# Patient Record
Sex: Male | Born: 1955 | Race: White | Hispanic: No | Marital: Married | Smoking: Current every day smoker
Health system: Southern US, Community
[De-identification: ages and names within clinical notes are randomized; demographics above are authoritative.]

## PROBLEM LIST (undated history)

## (undated) DIAGNOSIS — E785 Hyperlipidemia, unspecified: Secondary | ICD-10-CM

## (undated) DIAGNOSIS — M1990 Unspecified osteoarthritis, unspecified site: Secondary | ICD-10-CM

## (undated) HISTORY — PX: INNER EAR SURGERY: SHX679

## (undated) HISTORY — PX: APPENDECTOMY: SHX54

## (undated) HISTORY — PX: SHOULDER SURGERY: SHX246

## (undated) HISTORY — DX: Hyperlipidemia, unspecified: E78.5

## (undated) HISTORY — PX: TONSILLECTOMY: SUR1361

## (undated) HISTORY — PX: KNEE SURGERY: SHX244

---

## 1999-07-24 ENCOUNTER — Ambulatory Visit (HOSPITAL_COMMUNITY): Admission: RE | Admit: 1999-07-24 | Discharge: 1999-07-24 | Payer: Self-pay | Admitting: Orthopedic Surgery

## 1999-07-24 ENCOUNTER — Encounter: Payer: Self-pay | Admitting: Orthopedic Surgery

## 2004-05-29 ENCOUNTER — Ambulatory Visit (HOSPITAL_COMMUNITY): Admission: RE | Admit: 2004-05-29 | Discharge: 2004-05-29 | Payer: Self-pay | Admitting: Family Medicine

## 2005-02-19 ENCOUNTER — Ambulatory Visit: Payer: Self-pay | Admitting: Orthopedic Surgery

## 2006-06-22 ENCOUNTER — Emergency Department (HOSPITAL_COMMUNITY): Admission: EM | Admit: 2006-06-22 | Discharge: 2006-06-22 | Payer: Self-pay | Admitting: Emergency Medicine

## 2006-06-24 ENCOUNTER — Ambulatory Visit (HOSPITAL_COMMUNITY): Admission: RE | Admit: 2006-06-24 | Discharge: 2006-06-24 | Payer: Self-pay | Admitting: Family Medicine

## 2006-06-30 ENCOUNTER — Ambulatory Visit (HOSPITAL_COMMUNITY): Admission: RE | Admit: 2006-06-30 | Discharge: 2006-06-30 | Payer: Self-pay | Admitting: Family Medicine

## 2006-07-29 ENCOUNTER — Ambulatory Visit (HOSPITAL_COMMUNITY): Admission: RE | Admit: 2006-07-29 | Discharge: 2006-07-29 | Payer: Self-pay | Admitting: Neurosurgery

## 2007-12-14 ENCOUNTER — Ambulatory Visit (HOSPITAL_COMMUNITY): Admission: RE | Admit: 2007-12-14 | Discharge: 2007-12-14 | Payer: Self-pay | Admitting: Family Medicine

## 2008-11-03 HISTORY — PX: COLONOSCOPY: SHX174

## 2008-11-09 ENCOUNTER — Encounter: Payer: Self-pay | Admitting: Internal Medicine

## 2008-11-27 ENCOUNTER — Ambulatory Visit: Payer: Self-pay | Admitting: Internal Medicine

## 2008-11-27 ENCOUNTER — Ambulatory Visit (HOSPITAL_COMMUNITY): Admission: RE | Admit: 2008-11-27 | Discharge: 2008-11-27 | Payer: Self-pay | Admitting: Internal Medicine

## 2012-01-30 ENCOUNTER — Ambulatory Visit (HOSPITAL_COMMUNITY)
Admission: RE | Admit: 2012-01-30 | Discharge: 2012-01-30 | Disposition: A | Discharge status: Home / Self Care | Payer: BC Managed Care – PPO | Source: Ambulatory Visit | Attending: Cardiovascular Disease | Admitting: Cardiovascular Disease

## 2012-01-30 DIAGNOSIS — R079 Chest pain, unspecified: Secondary | ICD-10-CM | POA: Insufficient documentation

## 2012-11-02 ENCOUNTER — Other Ambulatory Visit: Payer: Self-pay | Admitting: Otolaryngology

## 2012-11-02 DIAGNOSIS — H905 Unspecified sensorineural hearing loss: Secondary | ICD-10-CM

## 2012-11-02 DIAGNOSIS — H9319 Tinnitus, unspecified ear: Secondary | ICD-10-CM

## 2012-11-02 DIAGNOSIS — H902 Conductive hearing loss, unspecified: Secondary | ICD-10-CM

## 2012-11-02 DIAGNOSIS — H6980 Other specified disorders of Eustachian tube, unspecified ear: Secondary | ICD-10-CM

## 2012-11-12 ENCOUNTER — Other Ambulatory Visit: Payer: BC Managed Care – PPO

## 2012-11-15 ENCOUNTER — Ambulatory Visit
Admission: RE | Admit: 2012-11-15 | Discharge: 2012-11-15 | Disposition: A | Discharge status: Home / Self Care | Payer: BC Managed Care – PPO | Source: Ambulatory Visit | Attending: Otolaryngology | Admitting: Otolaryngology

## 2012-11-15 ENCOUNTER — Other Ambulatory Visit: Payer: BC Managed Care – PPO

## 2012-11-15 ENCOUNTER — Other Ambulatory Visit: Payer: Self-pay | Admitting: Otolaryngology

## 2012-11-15 DIAGNOSIS — H905 Unspecified sensorineural hearing loss: Secondary | ICD-10-CM

## 2012-11-15 DIAGNOSIS — H6980 Other specified disorders of Eustachian tube, unspecified ear: Secondary | ICD-10-CM

## 2012-11-15 DIAGNOSIS — H902 Conductive hearing loss, unspecified: Secondary | ICD-10-CM

## 2012-11-15 DIAGNOSIS — H9319 Tinnitus, unspecified ear: Secondary | ICD-10-CM

## 2012-11-15 DIAGNOSIS — H65499 Other chronic nonsuppurative otitis media, unspecified ear: Secondary | ICD-10-CM

## 2013-06-17 ENCOUNTER — Emergency Department (HOSPITAL_COMMUNITY): Payer: BC Managed Care – PPO

## 2013-06-17 ENCOUNTER — Encounter (HOSPITAL_COMMUNITY): Payer: Self-pay | Admitting: Emergency Medicine

## 2013-06-17 ENCOUNTER — Emergency Department (HOSPITAL_COMMUNITY)
Admission: EM | Admit: 2013-06-17 | Discharge: 2013-06-17 | Disposition: A | Discharge status: Home / Self Care | Payer: BC Managed Care – PPO | Attending: Emergency Medicine | Admitting: Emergency Medicine

## 2013-06-17 DIAGNOSIS — Z9089 Acquired absence of other organs: Secondary | ICD-10-CM | POA: Insufficient documentation

## 2013-06-17 DIAGNOSIS — F17200 Nicotine dependence, unspecified, uncomplicated: Secondary | ICD-10-CM | POA: Insufficient documentation

## 2013-06-17 DIAGNOSIS — K5289 Other specified noninfective gastroenteritis and colitis: Secondary | ICD-10-CM | POA: Insufficient documentation

## 2013-06-17 DIAGNOSIS — K529 Noninfective gastroenteritis and colitis, unspecified: Secondary | ICD-10-CM

## 2013-06-17 LAB — COMPREHENSIVE METABOLIC PANEL
ALT: 27 U/L (ref 0–53)
AST: 24 U/L (ref 0–37)
Albumin: 3.6 g/dL (ref 3.5–5.2)
Alkaline Phosphatase: 126 U/L — ABNORMAL HIGH (ref 39–117)
BUN: 16 mg/dL (ref 6–23)
CO2: 28 mEq/L (ref 19–32)
Calcium: 9.5 mg/dL (ref 8.4–10.5)
Chloride: 101 mEq/L (ref 96–112)
Creatinine, Ser: 0.69 mg/dL (ref 0.50–1.35)
GFR calc Af Amer: >90 mL/min (ref >90)
GFR calc non Af Amer: >90 mL/min (ref >90)
Glucose, Bld: 91 mg/dL (ref 70–99)
Potassium: 4.4 mEq/L (ref 3.7–5.3)
Sodium: 140 mEq/L (ref 137–147)
Total Bilirubin: 0.3 mg/dL (ref 0.3–1.2)
Total Protein: 7 g/dL (ref 6.0–8.3)

## 2013-06-17 LAB — URINALYSIS, ROUTINE W REFLEX MICROSCOPIC
Bilirubin Urine: NEGATIVE
Glucose, UA: NEGATIVE mg/dL
Hgb urine dipstick: NEGATIVE
Ketones, ur: NEGATIVE mg/dL
Leukocytes, UA: NEGATIVE
Nitrite: NEGATIVE
Protein, ur: NEGATIVE mg/dL
Specific Gravity, Urine: >1.03 — ABNORMAL HIGH (ref 1.005–1.030)
Urobilinogen, UA: 0.2 mg/dL (ref 0.0–1.0)
pH: 5.5 (ref 5.0–8.0)

## 2013-06-17 LAB — LACTIC ACID, PLASMA: Lactic Acid, Venous: 0.9 mmol/L (ref 0.5–2.2)

## 2013-06-17 LAB — CBC WITH DIFFERENTIAL/PLATELET
Basophils Absolute: 0 10*3/uL (ref 0.0–0.1)
Basophils Relative: 0 % (ref 0–1)
Eosinophils Absolute: 0.3 10*3/uL (ref 0.0–0.7)
Eosinophils Relative: 4 % (ref 0–5)
HCT: 44.1 % (ref 39.0–52.0)
Hemoglobin: 14.8 g/dL (ref 13.0–17.0)
Lymphocytes Relative: 25 % (ref 12–46)
Lymphs Abs: 2 10*3/uL (ref 0.7–4.0)
MCH: 29.7 pg (ref 26.0–34.0)
MCHC: 33.6 g/dL (ref 30.0–36.0)
MCV: 88.6 fL (ref 78.0–100.0)
Monocytes Absolute: 0.7 10*3/uL (ref 0.1–1.0)
Monocytes Relative: 8 % (ref 3–12)
Neutro Abs: 5.1 10*3/uL (ref 1.7–7.7)
Neutrophils Relative %: 63 % (ref 43–77)
Platelets: 251 10*3/uL (ref 150–400)
RBC: 4.98 MIL/uL (ref 4.22–5.81)
RDW: 14 % (ref 11.5–15.5)
WBC: 8.1 10*3/uL (ref 4.0–10.5)

## 2013-06-17 LAB — LIPASE, BLOOD: Lipase: 20 U/L (ref 11–59)

## 2013-06-17 MED ORDER — GLYCOPYRROLATE 0.2 MG/ML IJ SOLN
0.1000 mg | Freq: Once | INTRAMUSCULAR | Status: AC
  Administered 2013-06-17: 0.1 mg via INTRAVENOUS
  Filled 2013-06-17: qty 1

## 2013-06-17 MED ORDER — IOHEXOL 300 MG/ML  SOLN
50.0000 mL | Freq: Once | INTRAMUSCULAR | Status: AC | PRN
  Administered 2013-06-17: 50 mL via ORAL

## 2013-06-17 MED ORDER — DOCUSATE SODIUM 100 MG PO CAPS: 100.0000 mg | ORAL_CAPSULE | Freq: Two times a day (BID) | ORAL | Status: DC

## 2013-06-17 MED ORDER — ONDANSETRON HCL 4 MG/2ML IJ SOLN
4.0000 mg | Freq: Once | INTRAMUSCULAR | Status: AC
  Administered 2013-06-17: 4 mg via INTRAVENOUS
  Filled 2013-06-17: qty 2

## 2013-06-17 MED ORDER — METRONIDAZOLE 500 MG PO TABS: 500.0000 mg | ORAL_TABLET | Freq: Two times a day (BID) | ORAL | Status: DC

## 2013-06-17 MED ORDER — METRONIDAZOLE 500 MG PO TABS
500.0000 mg | ORAL_TABLET | Freq: Once | ORAL | Status: AC
  Administered 2013-06-17: 500 mg via ORAL
  Filled 2013-06-17: qty 1

## 2013-06-17 MED ORDER — IOHEXOL 300 MG/ML  SOLN
100.0000 mL | Freq: Once | INTRAMUSCULAR | Status: AC | PRN
  Administered 2013-06-17: 100 mL via INTRAVENOUS

## 2013-06-17 MED ORDER — HYDROCODONE-ACETAMINOPHEN 5-325 MG PO TABS: 2.0000 | ORAL_TABLET | ORAL | Status: DC | PRN

## 2013-06-17 MED ORDER — HYDROMORPHONE HCL PF 1 MG/ML IJ SOLN
0.5000 mg | Freq: Once | INTRAMUSCULAR | Status: AC
  Administered 2013-06-17: 0.5 mg via INTRAVENOUS
  Filled 2013-06-17: qty 1

## 2013-06-17 MED ORDER — HYDROMORPHONE HCL PF 1 MG/ML IJ SOLN
1.0000 mg | INTRAMUSCULAR | Status: DC | PRN
  Administered 2013-06-17: 1 mg via INTRAVENOUS
  Filled 2013-06-17: qty 1

## 2013-06-17 MED ORDER — CIPROFLOXACIN HCL 500 MG PO TABS: 500.0000 mg | ORAL_TABLET | Freq: Two times a day (BID) | ORAL | Status: DC

## 2013-06-17 MED ORDER — SODIUM CHLORIDE 0.9 % IV BOLUS (SEPSIS)
1000.0000 mL | Freq: Once | INTRAVENOUS | Status: AC
  Administered 2013-06-17: 1000 mL via INTRAVENOUS

## 2013-06-17 MED ORDER — CIPROFLOXACIN HCL 250 MG PO TABS
500.0000 mg | ORAL_TABLET | Freq: Once | ORAL | Status: AC
  Administered 2013-06-17: 500 mg via ORAL
  Filled 2013-06-17: qty 2

## 2013-06-17 MED ORDER — DICYCLOMINE HCL 10 MG/ML IM SOLN
20.0000 mg | Freq: Once | INTRAMUSCULAR | Status: AC
  Administered 2013-06-17: 20 mg via INTRAMUSCULAR
  Filled 2013-06-17: qty 2

## 2013-06-17 NOTE — Care Management Note (Signed)
Patient was noted to not have a PCP listed, but per patient PCP is Dr Phillips [facility]orGolding, at [facility]  . Entered this information into computer.

## 2013-06-17 NOTE — ED Provider Notes (Signed)
CSN: [id number]     Arrival date & time 06/17/13  4696 History  This chart was scribed for Rolland Porter, MD by Jarvis Morgan, ED Scribe. This patient was seen in room APA14/APA14 and the patient's care was started at 8:34 AM.   Chief Complaint  Patient presents with  . Abdominal Pain     The history is provided by the patient. No language interpreter was used.   HPI Comments: Dale Perez is a 58 y.o. male who presents to the Emergency Department complaining of sharp, waxing and waning, gradually worsening RUQ abdominal pain for 3 days. Patient is having associated episodic diarrhea. Patient states that everytime he eats that he immediately has diarrhea. Patient states that the pain came on slow and then eventually became worse. Patient states that he has had this similar pain when he had his appendix out. Patient states that he had his appendix out about 1.5 years ago at [facility]. Patient denies any history of kidney infection, kidney stones, or gallbladder issues. Patient had a colonoscopy but is not sure if there were any abnormalities. He states that he believes his last colonoscopy was 4 years ago. Patient denies any SOB, hematuria, emesis, fever, chest pain, or swelling in his extremities.     History reviewed. No pertinent past medical history. Past Surgical History  Procedure Laterality Date  . Shoulder surgery    . Appendectomy    . Knee surgery    . Tonsillectomy     No family history on file. History  Substance Use Topics  . Smoking status: Current Every Day Smoker    Types: Cigarettes  . Smokeless tobacco: Not on file  . Alcohol Use: No    Review of Systems  Constitutional: Negative for fever, chills, diaphoresis, appetite change and fatigue.  HENT: Negative for mouth sores, sore throat and trouble swallowing.   Eyes: Negative for visual disturbance.  Respiratory: Negative for cough, chest tightness, shortness of breath and wheezing.   Cardiovascular:  Negative for chest pain and leg swelling.  Gastrointestinal: Positive for abdominal pain (RUQ) and diarrhea. Negative for nausea, vomiting and abdominal distention.  Endocrine: Negative for polydipsia, polyphagia and polyuria.  Genitourinary: Negative for dysuria, frequency and hematuria.  Musculoskeletal: Negative for gait problem.  Skin: Negative for color change, pallor and rash.  Neurological: Negative for dizziness, syncope, light-headedness and headaches.  Hematological: Does not bruise/bleed easily.  Psychiatric/Behavioral: Negative for behavioral problems and confusion.      Allergies  Review of patient's allergies indicates no known allergies.  Home Medications   Prior to Admission medications   Not on File   Triage Vitals: BP 128/86  Pulse 77  Temp(Src) 98.1 F (36.7 C) (Oral)  Resp 16  Ht 6\' 1"  (1.854 m)  Wt 153 lb (69.4 kg)  BMI 20.19 kg/m2  SpO2 96%  Physical Exam  Constitutional: He is oriented to person, place, and time. He appears well-developed and well-nourished. No distress.  HENT:  Head: Normocephalic.  Eyes: Conjunctivae are normal. Pupils are equal, round, and reactive to light. No scleral icterus.  Neck: Normal range of motion. Neck supple. No thyromegaly present.  Cardiovascular: Normal rate and regular rhythm.  Exam reveals no gallop and no friction rub.   No murmur heard. Pulmonary/Chest: Effort normal and breath sounds normal. No respiratory distress. He has no wheezes. He has no rales.  Abdominal: Soft. Bowel sounds are normal. He exhibits no distension. There is tenderness. There is no rebound.  N inguinal hernia.  Tender RUQ with rebound  Musculoskeletal: Normal range of motion.  Neurological: He is alert and oriented to person, place, and time.  Skin: Skin is warm and dry. No rash noted.  Psychiatric: He has a normal mood and affect. His behavior is normal.    ED Course  Procedures (including critical care time)  DIAGNOSTIC  STUDIES: Oxygen Saturation is 96% on RA, adequate by my interpretation.    COORDINATION OF CARE: 8:40 AM- Will order CT of Abdomen/Pelvis w/ contrast, CBC with diff, CMP, blood lipase, and UA. Will also order Zofran injection, Robinul injection, normal saline IV. Pt advised of plan for treatment and pt agrees.    Labs Review Labs Reviewed  COMPREHENSIVE METABOLIC PANEL - Abnormal; Notable for the following:    Alkaline Phosphatase 126 (*)    All other components within normal limits  URINALYSIS, ROUTINE W REFLEX MICROSCOPIC - Abnormal; Notable for the following:    Specific Gravity, Urine >1.030 (*)    All other components within normal limits  CBC WITH DIFFERENTIAL  LIPASE, BLOOD  LACTIC ACID, PLASMA    Imaging Review Ct Abdomen Pelvis W Contrast  06/17/2013   CLINICAL DATA:  RIGHT lower quadrant suprapubic pain for several days, past history appendectomy  EXAM: CT ABDOMEN AND PELVIS WITH CONTRAST  TECHNIQUE: Multidetector CT imaging of the abdomen and pelvis was performed using the standard protocol following bolus administration of intravenous contrast. Sagittal and coronal MPR images reconstructed from axial data set.  CONTRAST:  100mL OMNIPAQUE IOHEXOL 300 MG/ML SOLN IV. Dilute oral contrast.  FINDINGS: Minimal atelectasis at lung bases.  RIGHT renal cyst 3.6 x 3.3 cm.  Small hepatic cysts largest at lateral segment LEFT lobe 10 mm diameter image 14.  Liver, spleen, pancreas, kidneys, and adrenal glands otherwise normal appearance.  Scattered atherosclerotic calcifications with fusiform aneurysmal dilatation of distal abdominal aorta 3.1 x 3.0 cm image 44.  Marked wall thickening ascending colon and with pericolic inflammatory changes consistent with ascending colitis.  Additional mild wall thickening suspected at the hepatic flexure through transverse colon.  Descending colon, sigmoid colon and rectum normal appearance.  Stomach and small bowel loops normal appearance.  Numerous normal  sized small reactive lymph nodes and mesentery medial to RIGHT colon.  Appendix surgically absent.  Unremarkable bladder and ureters.  No mass, adenopathy, free fluid or free air.  Degenerative disc and facet disease changes of lumbosacral junction.  IMPRESSION: Ascending colitis with question additional wall thickening of the hepatic flexure through transverse colon.  Differential diagnosis includes infection, inflammatory bowel disease, and ischemia.  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  Hepatic and RIGHT renal cysts.   Electronically Signed   By: Ulyses SouthwardMark  Boles M.D.   On: 06/17/2013 10:34     EKG Interpretation None      MDM   Final diagnoses:  Colitis   CT scan shows thickening of the descending colon consistent with colitis. Patient is "unsure" he has had blood in his stools. He had a normal colonoscopy 4 years ago. No history of known colitis. Does not have difficult control pain I would suspect this is ischemic. He is well-hydrated. He has not been losing fluids to the point I think he is dehydrated. I do not think this is ischemic colitis. This is very likely infectious colitis. Plan to give additional hydration symptom control here. Doses of by mouth Cipro and Flagyl. Antispasmodics, pain medicine, and antibiotic prescriptions. Rest  and stay hydrated. Avoid dairy. GI followup.   I personally performed the services described in this documentation, which was scribed in my presence. The recorded information has been reviewed and is accurate.     Rolland PorterMark Emmer Lillibridge, MD 06/17/13 1328

## 2013-06-17 NOTE — Discharge Instructions (Signed)
Your CT scan shows inflammation or infection of your colon, colitis. This is caused by bacteria.  Be treated with antibiotics, pain medicines. You'll need to be reevaluated within the next 2 weeks and have a followup colonoscopy to ensure that this is resolved. She develop worsening pain, fever, vomiting, or inability to tolerate symptoms at home, then recheck here place  Colitis Colitis is inflammation of the colon. Colitis can be a short-term or long-standing (chronic) illness. Crohn's disease and ulcerative colitis are 2 types of colitis which are chronic. They usually require lifelong treatment. CAUSES  There are many different causes of colitis, including:  Viruses.  Germs (bacteria).  Medicine reactions. SYMPTOMS   Diarrhea.  Intestinal bleeding.  Pain.  Fever.  Throwing up (vomiting).  Tiredness (fatigue).  Weight loss.  Bowel blockage. DIAGNOSIS  The diagnosis of colitis is based on examination and stool or blood tests. X-rays, CT scan, and colonoscopy may also be needed. TREATMENT  Treatment may include:  Fluids given through the vein (intravenously).  Bowel rest (nothing to eat or drink for a period of time).  Medicine for pain and diarrhea.  Medicines (antibiotics) that kill germs.  Cortisone medicines.  Surgery. HOME CARE INSTRUCTIONS   Get plenty of rest.  Drink enough water and fluids to keep your urine clear or pale yellow.  Eat a well-balanced diet.  Call your caregiver for follow-up as recommended. SEEK IMMEDIATE MEDICAL CARE IF:   You develop chills.  You have an oral temperature above 102 F (38.9 C), not controlled by medicine.  You have extreme weakness, fainting, or dehydration.  You have repeated vomiting.  You develop severe belly (abdominal) pain or are passing bloody or tarry stools. MAKE SURE YOU:   Understand these instructions.  Will watch your condition.  Will get help right away if you are not doing well or get  worse. Document Released: 02/28/2004 Document Revised: 04/14/2011 Document Reviewed: 05/25/2009 [facility] Patient Information 2014 [employer] [place].

## 2013-06-17 NOTE — ED Notes (Signed)
RLQ pain x 3 days with diarrhea.  Denies n/v/fever.

## 2015-07-24 DIAGNOSIS — N200 Calculus of kidney: Secondary | ICD-10-CM | POA: Diagnosis not present

## 2015-07-24 DIAGNOSIS — M5136 Other intervertebral disc degeneration, lumbar region: Secondary | ICD-10-CM | POA: Diagnosis not present

## 2015-07-24 DIAGNOSIS — S39012A Strain of muscle, fascia and tendon of lower back, initial encounter: Secondary | ICD-10-CM | POA: Diagnosis not present

## 2015-07-24 DIAGNOSIS — I714 Abdominal aortic aneurysm, without rupture: Secondary | ICD-10-CM | POA: Diagnosis not present

## 2015-07-24 DIAGNOSIS — F17200 Nicotine dependence, unspecified, uncomplicated: Secondary | ICD-10-CM | POA: Diagnosis not present

## 2015-07-24 DIAGNOSIS — M5137 Other intervertebral disc degeneration, lumbosacral region: Secondary | ICD-10-CM | POA: Diagnosis not present

## 2015-08-09 ENCOUNTER — Emergency Department (HOSPITAL_COMMUNITY): Payer: Worker's Compensation

## 2015-08-09 ENCOUNTER — Emergency Department (HOSPITAL_COMMUNITY)
Admission: EM | Admit: 2015-08-09 | Discharge: 2015-08-10 | Disposition: A | Discharge status: Home / Self Care | Payer: Worker's Compensation | Attending: Emergency Medicine | Admitting: Emergency Medicine

## 2015-08-09 ENCOUNTER — Encounter: Payer: Self-pay | Admitting: *Deleted

## 2015-08-09 ENCOUNTER — Encounter (HOSPITAL_COMMUNITY): Payer: Self-pay

## 2015-08-09 ENCOUNTER — Emergency Department (INDEPENDENT_AMBULATORY_CARE_PROVIDER_SITE_OTHER)
Admission: EM | Admit: 2015-08-09 | Discharge: 2015-08-09 | Disposition: A | Discharge status: Other Acute Inpatient Hospital | Payer: Worker's Compensation | Source: Home / Self Care | Attending: Emergency Medicine | Admitting: Emergency Medicine

## 2015-08-09 DIAGNOSIS — S66222A Laceration of extensor muscle, fascia and tendon of left thumb at wrist and hand level, initial encounter: Secondary | ICD-10-CM | POA: Insufficient documentation

## 2015-08-09 DIAGNOSIS — Z23 Encounter for immunization: Secondary | ICD-10-CM | POA: Diagnosis not present

## 2015-08-09 DIAGNOSIS — Z886 Allergy status to analgesic agent status: Secondary | ICD-10-CM | POA: Diagnosis not present

## 2015-08-09 DIAGNOSIS — S66529A Laceration of intrinsic muscle, fascia and tendon of unspecified finger at wrist and hand level, initial encounter: Secondary | ICD-10-CM | POA: Diagnosis not present

## 2015-08-09 DIAGNOSIS — S61012A Laceration without foreign body of left thumb without damage to nail, initial encounter: Secondary | ICD-10-CM

## 2015-08-09 DIAGNOSIS — S61219A Laceration without foreign body of unspecified finger without damage to nail, initial encounter: Secondary | ICD-10-CM

## 2015-08-09 DIAGNOSIS — S61209A Unspecified open wound of unspecified finger without damage to nail, initial encounter: Secondary | ICD-10-CM | POA: Diagnosis present

## 2015-08-09 DIAGNOSIS — F17210 Nicotine dependence, cigarettes, uncomplicated: Secondary | ICD-10-CM | POA: Insufficient documentation

## 2015-08-09 DIAGNOSIS — S61002A Unspecified open wound of left thumb without damage to nail, initial encounter: Secondary | ICD-10-CM | POA: Diagnosis not present

## 2015-08-09 LAB — CBC WITH DIFFERENTIAL/PLATELET
Basophils Absolute: 0 10*3/uL (ref 0.0–0.1)
Basophils Relative: 0 %
Eosinophils Absolute: 0.3 10*3/uL (ref 0.0–0.7)
Eosinophils Relative: 4 %
HCT: 37 % — ABNORMAL LOW (ref 39.0–52.0)
Hemoglobin: 13.5 g/dL (ref 13.0–17.0)
Lymphocytes Relative: 40 %
Lymphs Abs: 2.7 10*3/uL (ref 0.7–4.0)
MCH: 32.7 pg (ref 26.0–34.0)
MCHC: 36.5 g/dL — ABNORMAL HIGH (ref 30.0–36.0)
MCV: 89.6 fL (ref 78.0–100.0)
Monocytes Absolute: 0.5 10*3/uL (ref 0.1–1.0)
Monocytes Relative: 7 %
Neutro Abs: 3.4 10*3/uL (ref 1.7–7.7)
Neutrophils Relative %: 49 %
Platelets: 170 10*3/uL (ref 150–400)
RBC: 4.13 MIL/uL — ABNORMAL LOW (ref 4.22–5.81)
RDW: 15 % (ref 11.5–15.5)
WBC: 6.8 10*3/uL (ref 4.0–10.5)

## 2015-08-09 LAB — BASIC METABOLIC PANEL
Anion gap: 6 (ref 5–15)
BUN: 12 mg/dL (ref 6–20)
CO2: 26 mmol/L (ref 22–32)
Calcium: 8.7 mg/dL — ABNORMAL LOW (ref 8.9–10.3)
Chloride: 105 mmol/L (ref 101–111)
Creatinine, Ser: 0.64 mg/dL (ref 0.61–1.24)
GFR calc Af Amer: >60 mL/min (ref >60)
GFR calc non Af Amer: >60 mL/min (ref >60)
Glucose, Bld: 92 mg/dL (ref 65–99)
Potassium: 4 mmol/L (ref 3.5–5.1)
Sodium: 137 mmol/L (ref 135–145)

## 2015-08-09 MED ORDER — SUFENTANIL CITRATE 50 MCG/ML IV SOLN
INTRAVENOUS | Status: AC
  Filled 2015-08-09: qty 1

## 2015-08-09 MED ORDER — TETANUS-DIPHTH-ACELL PERTUSSIS 5-2.5-18.5 LF-MCG/0.5 IM SUSP
0.5000 mL | Freq: Once | INTRAMUSCULAR | Status: AC
  Administered 2015-08-09: 0.5 mL via INTRAMUSCULAR

## 2015-08-09 MED ORDER — OXYCODONE-ACETAMINOPHEN 5-325 MG PO TABS
1.0000 | ORAL_TABLET | Freq: Once | ORAL | Status: AC
  Administered 2015-08-09: 1 via ORAL
  Filled 2015-08-09: qty 1

## 2015-08-09 MED ORDER — PROPOFOL 10 MG/ML IV BOLUS
INTRAVENOUS | Status: AC
  Filled 2015-08-09: qty 20

## 2015-08-09 MED ORDER — MIDAZOLAM HCL 2 MG/2ML IJ SOLN
INTRAMUSCULAR | Status: AC
  Filled 2015-08-09: qty 2

## 2015-08-09 NOTE — ED Notes (Addendum)
Pt works for [employer]. 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 Site cleaned with Hibiclens, he is in need of a tetanus vaccine. Dannette BarbaraNana Hendricks spoke with supervisor, Per Rennie Natteravid Larkin, do not perform drug screen.

## 2015-08-09 NOTE — ED Notes (Signed)
Patient transported to X-ray 

## 2015-08-09 NOTE — ED Notes (Signed)
Per Pt, Pt is coming from UC where he was seen for a hand injury that took place at work. Pt reports being Worker's Comp. Pt was sent to ED due to inability to fully move thumb and potential for tendon repair. Bleeding has been controlled and UC splinted the left thumb.

## 2015-08-09 NOTE — Anesthesia Preprocedure Evaluation (Addendum)
Anesthesia Evaluation  Patient identified by MRN, date of birth, ID band Patient awake    Reviewed: Allergy & Precautions, H&P , NPO status , Patient's Chart, lab work & pertinent test results  Airway Mallampati: II  TM Distance: >3 FB Neck ROM: Full    Dental no notable dental hx. (+) Teeth Intact, Dental Advisory Given   Pulmonary Current Smoker,    Pulmonary exam normal breath sounds clear to auscultation       Cardiovascular negative cardio ROS   Rhythm:Regular Rate:Normal     Neuro/Psych negative neurological ROS  negative psych ROS   GI/Hepatic negative GI ROS, Neg liver ROS,   Endo/Other  negative endocrine ROS  Renal/GU negative Renal ROS  negative genitourinary   Musculoskeletal   Abdominal   Peds  Hematology negative hematology ROS (+)   Anesthesia Other Findings   Reproductive/Obstetrics negative OB ROS                            Anesthesia Physical Anesthesia Plan  ASA: II  Anesthesia Plan: General   Post-op Pain Management:    Induction: Intravenous  Airway Management Planned: LMA and Oral ETT  Additional Equipment:   Intra-op Plan:   Post-operative Plan: Extubation in OR  Informed Consent: I have reviewed the patients History and Physical, chart, labs and discussed the procedure including the risks, benefits and alternatives for the proposed anesthesia with the patient or authorized representative who has indicated his/her understanding and acceptance.   Dental advisory given  Plan Discussed with: CRNA  Anesthesia Plan Comments:         Anesthesia Quick Evaluation

## 2015-08-09 NOTE — ED Provider Notes (Signed)
CSN: [id number]     Arrival date & time 08/09/15  1423 History   First MD Initiated Contact with Patient 08/09/15 1441     Chief Complaint  Patient presents with  . Extremity Laceration   [facility]. Acute Worker's Comp. injury HPI While on the job today, accidentally cut dorsum left thumb. Has mild to moderate pain, but notes he cannot extend left thumb. Prior to the injury, he could extend the left thumb without problems. Last tetanus shot over 5 years ago No past medical history on file. Past Surgical History  Procedure Laterality Date  . Shoulder surgery    . Appendectomy    . Knee surgery    . Tonsillectomy     No family history on file. Social History  Substance Use Topics  . Smoking status: Current Every Day Smoker    Types: Cigarettes  . Smokeless tobacco: Not on file  . Alcohol Use: No    Review of Systems  All other systems reviewed and are negative.   Allergies  Review of patient's allergies indicates no known allergies.  Home Medications   Prior to Admission medications   Medication Sig Start Date End Date Taking? Authorizing Provider  ciprofloxacin (CIPRO) 500 MG tablet Take 1 tablet (500 mg total) by mouth every 12 (twelve) hours. 06/17/13   Rolland PorterMark James, MD  docusate sodium (COLACE) 100 MG capsule Take 1 capsule (100 mg total) by mouth every 12 (twelve) hours. 06/17/13   Rolland PorterMark James, MD  HYDROcodone-acetaminophen (NORCO/VICODIN) 5-325 MG per tablet Take 2 tablets by mouth every 4 (four) hours as needed. 06/17/13   Rolland PorterMark James, MD  methocarbamol (ROBAXIN) 500 MG tablet Take 1 tablet by mouth 3 (three) times daily as needed for muscle spasms.  06/02/13   Historical Provider, MD  metroNIDAZOLE (FLAGYL) 500 MG tablet Take 1 tablet (500 mg total) by mouth 2 (two) times daily. 06/17/13   Rolland PorterMark James, MD  Misc Natural Products (DAILY HERBS BONE/JOINTS PO) Take 1 tablet by mouth 2 (two) times daily.    Historical Provider, MD  naproxen sodium  (ANAPROX) 220 MG tablet Take 440 mg by mouth 2 (two) times daily with a meal.    Historical Provider, MD  oxyCODONE-acetaminophen (PERCOCET/ROXICET) 5-325 MG per tablet Take 1 tablet by mouth daily as needed for moderate pain or severe pain.  06/02/13   Historical Provider, MD   Meds Ordered and Administered this Visit   Medications  Tdap (BOOSTRIX) injection 0.5 mL (0.5 mLs Intramuscular Given 08/09/15 1518)    There were no vitals taken for this visit. No data found.   Physical Exam  Constitutional: He is oriented to person, place, and time. He appears well-developed and well-nourished. No distress.  HENT:  Head: Normocephalic and atraumatic.  Eyes: Conjunctivae and EOM are normal. Pupils are equal, round, and reactive to light. No scleral icterus.  Neck: Normal range of motion.  Cardiovascular: Normal rate.   Pulmonary/Chest: Effort normal.  Abdominal: He exhibits no distension.  Musculoskeletal: Normal range of motion.       Left hand: Decreased strength noted.       Hands: Neurological: He is alert and oriented to person, place, and time.  Skin: Skin is warm.  Psychiatric: He has a normal mood and affect.  Nursing note and vitals reviewed.   ED Course  Procedures (including critical care time)  Labs Review Labs Reviewed - No data to display  Imaging Review No results found.  MDM   1. Laceration of left thumb with tendon involvement, initial encounter    Likely has acute laceration through extensor tendon left thumb, inability to extend left thumb DIP. In my opinion, needs referral to specialist for repair  Plans: DTaP given today. Wound bandaged and splinted. [facility] care and repair.     Lajean Manesavid Massey, MD 08/09/15 [phone]

## 2015-08-09 NOTE — ED Provider Notes (Signed)
CSN: [id number]     Arrival date & time 08/09/15  1718 History   First MD Initiated Contact with Patient 08/09/15 1856     Chief Complaint  Patient presents with  . 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 Patient [place] went [place] where he was unable to extend his thumb. Decreased extension increased suspicion of extensor tendon involvement and the [facility]. Patient has pain in left thumb but denies additional pain or injury. He was not able to extend his thumb upon evaluation in the ED. Tdap was updated at urgent care. Patient is a smoker. Patient denies fever, numbness, recent illness, SOB, CP, N/V.   [name] presenting with hand injury. The history is provided by the patient. No language interpreter was used.  Hand Injury Associated symptoms: no fever     History reviewed. No pertinent past medical history. Past Surgical History  Procedure Laterality Date  . Shoulder surgery    . Appendectomy    . Knee surgery    . Tonsillectomy     No family history on file. Social History  Substance Use Topics  . Smoking status: Current Every Day Smoker    Types: Cigarettes  . Smokeless tobacco: None  . Alcohol Use: No    Review of Systems  Constitutional: Negative for fever.  Musculoskeletal: Positive for arthralgias.  Skin: Positive for wound. Negative for rash.  Neurological: Positive for weakness. Negative for numbness.      Allergies  Asa  Home Medications   Prior to Admission medications   Medication Sig Start Date End Date Taking? Authorizing Provider  naproxen sodium (ALEVE) 220 MG tablet Take 440 mg by mouth every morning.   Yes Historical Provider, MD   BP 147/82 mmHg  Pulse 52  Temp(Src) 98.5 F (36.9 C)  (Oral)  Resp 16  Ht 6\' 1"  (1.854 m)  Wt 74.844 kg  BMI 21.77 kg/m2  SpO2 93% Physical Exam  Constitutional: He appears well-developed and well-nourished. No distress.  HENT:  Head: Normocephalic and atraumatic.  Eyes: Right eye exhibits no discharge. Left eye exhibits no discharge.  Cardiovascular: Normal rate, regular rhythm and intact distal pulses.   Bilateral radial pulses are intact. Good capillary refill to his left distal thumb.  Pulmonary/Chest: Effort normal. No respiratory distress.  Musculoskeletal: He exhibits tenderness.  Patient has a 2 cm laceration over the dorsal aspect of his left thumb. See picture for more details. Patient has good strength with flexion of his left thumb but is unable to extend his left thumb under light resistance. Bleeding is controled.   Neurological: He is alert. Coordination normal.  Sensation is intact to his left distal fingertips.  Skin: Skin is warm and dry. No rash noted. He is not diaphoretic. No erythema. No pallor.  Psychiatric: He has a normal mood and affect. His behavior is normal.  Nursing note and vitals reviewed.       ED Course  Procedures (including critical care time) Labs Review Labs Reviewed  BASIC METABOLIC PANEL - Abnormal; Notable for the following:    Calcium 8.7 (*)    All other components within normal limits  CBC WITH DIFFERENTIAL/PLATELET - Abnormal; Notable for the following:    RBC  4.13 (*)    HCT 37.0 (*)    MCHC 36.5 (*)    All other components within normal limits    Imaging Review Dg Hand Complete Left  08/09/2015  CLINICAL DATA:  Trauma to the left thumb.  Struck by a broken duct. EXAM: LEFT HAND - COMPLETE 3+ VIEW COMPARISON:  None. FINDINGS: No evidence of fracture or dislocation. Tiny radiopaque foreign object at the tip of the thumb (less than 1 mm). Tiny radiopaque foreign object in the soft tissues of the index finger at the DIP joint level, less than 1 mm. 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 IMPRESSION: No likely acute finding. 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 Fleck like metallic density foreign objects at the tip of the thumb and in the distal index finger, likely old. Electronically Signed   By: Paulina FusiMark  Shogry M.D.   On: 08/09/2015 20:19   I have personally reviewed and evaluated these images and lab results as part of my medical decision-making.   EKG Interpretation None      Filed Vitals:   08/09/15 2320 08/09/15 2330 08/10/15 0000 08/10/15 0030  BP:  132/82 143/87 147/82  Pulse: 56 58 55 52  Temp:      TempSrc:      Resp:      Height:      Weight:      SpO2: 95% 93% 96% 93%     MDM   Meds given in ED:  Medications  oxyCODONE-acetaminophen (PERCOCET/ROXICET) 5-325 MG per tablet 1 tablet (1 tablet Oral Given 08/09/15 1956)    New Prescriptions   No medications on file    Final diagnoses:  Laceration of finger with tendon involvement, initial encounter   This is a 60 yo right hand dominant white male presents to ED after work related injury when installing an elevator part that resulted in laceration to dorsal surface of left thumb. Patient [place] went [place] urgent [place] where he was unable to extend his thumb. Decreased extension increased suspicion of extensor tendon involvement and the [facility]. Patient has pain in left thumb but denies additional pain or injury.  On exam patient is afebrile nontoxic appearing. He has a 2 cm laceration over the dorsal aspect of his left thumb. Patient is able to flex his thumb but is unable to extend his thumb. He is neurovascularly intact. X-ray shows no acute bony abnormality. The suspected foreign bodies are either old or unrelated to this injury. I consulted with hand surgeon Dr. Janee Mornhompson who plans to take the patient to the OR for tendon and laceration repair.  Patient is in agreement with plan. The OR for tendon laceration repair by Dr. Janee Mornhompson.   Everlene FarrierWilliam Jaeleigh Monaco, PA-C 08/10/15 0100  Richardean Canalavid H Yao, MD 08/10/15 1140

## 2015-08-10 ENCOUNTER — Emergency Department (HOSPITAL_COMMUNITY): Payer: Worker's Compensation | Admitting: Certified Registered"

## 2015-08-10 ENCOUNTER — Encounter (HOSPITAL_COMMUNITY)
Admission: EM | Disposition: A | Discharge status: Home / Self Care | Payer: Self-pay | Source: Home / Self Care | Attending: Emergency Medicine

## 2015-08-10 ENCOUNTER — Encounter (HOSPITAL_COMMUNITY): Payer: Self-pay | Admitting: Certified Registered"

## 2015-08-10 HISTORY — PX: TENDON REPAIR: SHX5111

## 2015-08-10 HISTORY — PX: I & D EXTREMITY: SHX5045

## 2015-08-10 SURGERY — TENDON REPAIR
Anesthesia: General | Site: Finger

## 2015-08-10 MED ORDER — LACTATED RINGERS IV SOLN
INTRAVENOUS | Status: DC | PRN
  Administered 2015-08-10 (×2): via INTRAVENOUS

## 2015-08-10 MED ORDER — GLYCOPYRROLATE 0.2 MG/ML IV SOSY
PREFILLED_SYRINGE | INTRAVENOUS | Status: AC
  Filled 2015-08-10: qty 3

## 2015-08-10 MED ORDER — EPHEDRINE SULFATE 50 MG/ML IJ SOLN
INTRAMUSCULAR | Status: DC | PRN
  Administered 2015-08-10: 10 mg via INTRAVENOUS

## 2015-08-10 MED ORDER — ONDANSETRON HCL 4 MG/2ML IJ SOLN
INTRAMUSCULAR | Status: DC | PRN
  Administered 2015-08-10: 4 mg via INTRAVENOUS

## 2015-08-10 MED ORDER — CEFAZOLIN SODIUM 1 G IJ SOLR
INTRAMUSCULAR | Status: DC | PRN
  Administered 2015-08-10: 2 g via INTRAMUSCULAR

## 2015-08-10 MED ORDER — DEXAMETHASONE SODIUM PHOSPHATE 10 MG/ML IJ SOLN
INTRAMUSCULAR | Status: DC | PRN
  Administered 2015-08-10: 10 mg via INTRAVENOUS

## 2015-08-10 MED ORDER — 0.9 % SODIUM CHLORIDE (POUR BTL) OPTIME
TOPICAL | Status: DC | PRN
  Administered 2015-08-10: 1000 mL

## 2015-08-10 MED ORDER — HYDROMORPHONE HCL 1 MG/ML IJ SOLN: 0.2500 mg | INTRAMUSCULAR | Status: DC | PRN

## 2015-08-10 MED ORDER — SUFENTANIL CITRATE 50 MCG/ML IV SOLN
INTRAVENOUS | Status: DC | PRN
  Administered 2015-08-10 (×2): 10 ug via INTRAVENOUS

## 2015-08-10 MED ORDER — OXYCODONE-ACETAMINOPHEN 5-325 MG PO TABS: 1.0000 | ORAL_TABLET | ORAL | Status: DC | PRN

## 2015-08-10 MED ORDER — GLYCOPYRROLATE 0.2 MG/ML IJ SOLN
INTRAMUSCULAR | Status: DC | PRN
  Administered 2015-08-10: 0.2 mg via INTRAVENOUS

## 2015-08-10 MED ORDER — LIDOCAINE HCL (CARDIAC) 20 MG/ML IV SOLN
INTRAVENOUS | Status: DC | PRN
  Administered 2015-08-10: 100 mg via INTRATRACHEAL

## 2015-08-10 MED ORDER — BUPIVACAINE-EPINEPHRINE 0.5% -1:200000 IJ SOLN
INTRAMUSCULAR | Status: DC | PRN
  Administered 2015-08-10: 9 mL

## 2015-08-10 MED ORDER — LIDOCAINE 2% (20 MG/ML) 5 ML SYRINGE
INTRAMUSCULAR | Status: AC
  Filled 2015-08-10: qty 5

## 2015-08-10 MED ORDER — DEXAMETHASONE SODIUM PHOSPHATE 10 MG/ML IJ SOLN
INTRAMUSCULAR | Status: AC
  Filled 2015-08-10: qty 1

## 2015-08-10 MED ORDER — MIDAZOLAM HCL 2 MG/2ML IJ SOLN
INTRAMUSCULAR | Status: DC | PRN
  Administered 2015-08-10: 2 mg via INTRAVENOUS

## 2015-08-10 MED ORDER — ONDANSETRON HCL 4 MG/2ML IJ SOLN
INTRAMUSCULAR | Status: AC
  Filled 2015-08-10: qty 2

## 2015-08-10 MED ORDER — CEFAZOLIN SODIUM 1 G IJ SOLR
INTRAMUSCULAR | Status: AC
  Filled 2015-08-10: qty 20

## 2015-08-10 MED ORDER — SUCCINYLCHOLINE CHLORIDE 20 MG/ML IJ SOLN
INTRAMUSCULAR | Status: DC | PRN
  Administered 2015-08-10: 100 mg via INTRAVENOUS

## 2015-08-10 MED ORDER — CEPHALEXIN 500 MG PO CAPS: 500.0000 mg | ORAL_CAPSULE | Freq: Four times a day (QID) | ORAL | Status: DC

## 2015-08-10 MED ORDER — BUPIVACAINE-EPINEPHRINE (PF) 0.5% -1:200000 IJ SOLN
INTRAMUSCULAR | Status: AC
  Filled 2015-08-10: qty 30

## 2015-08-10 MED ORDER — PROPOFOL 10 MG/ML IV BOLUS
INTRAVENOUS | Status: DC | PRN
  Administered 2015-08-10: 50 mg via INTRAVENOUS
  Administered 2015-08-10: 150 mg via INTRAVENOUS

## 2015-08-10 SURGICAL SUPPLY — 44 items
BANDAGE COBAN STERILE 2 (GAUZE/BANDAGES/DRESSINGS) IMPLANT
BLADE SURG 15 STRL LF DISP TIS (BLADE) ×2 IMPLANT
BLADE SURG 15 STRL SS (BLADE) ×3
BNDG CMPR 9X4 STRL LF SNTH (GAUZE/BANDAGES/DRESSINGS) ×1
BNDG COHESIVE 4X5 TAN STRL (GAUZE/BANDAGES/DRESSINGS) ×4 IMPLANT
BNDG ESMARK 4X9 LF (GAUZE/BANDAGES/DRESSINGS) ×4 IMPLANT
BNDG GAUZE ELAST 4 BULKY (GAUZE/BANDAGES/DRESSINGS) ×4 IMPLANT
BRUSH SCRUB EZ PLAIN DRY (MISCELLANEOUS) IMPLANT
CANISTER SUCTION 2500CC (MISCELLANEOUS) ×4 IMPLANT
CHLORAPREP W/TINT 26ML (MISCELLANEOUS) ×4 IMPLANT
CORDS BIPOLAR (ELECTRODE) ×4 IMPLANT
CUFF TOURNIQUET SINGLE 18IN (TOURNIQUET CUFF) ×4 IMPLANT
CUFF TOURNIQUET SINGLE 24IN (TOURNIQUET CUFF) IMPLANT
DRAPE SURG 17X23 STRL (DRAPES) ×4 IMPLANT
DRSG ADAPTIC 3X8 NADH LF (GAUZE/BANDAGES/DRESSINGS) ×4 IMPLANT
DRSG EMULSION OIL 3X3 NADH (GAUZE/BANDAGES/DRESSINGS) IMPLANT
GAUZE SPONGE 4X4 12PLY STRL (GAUZE/BANDAGES/DRESSINGS) ×4 IMPLANT
GAUZE XEROFORM 1X8 LF (GAUZE/BANDAGES/DRESSINGS) ×4 IMPLANT
GLOVE BIO SURGEON STRL SZ7.5 (GLOVE) ×4 IMPLANT
GLOVE BIOGEL PI IND STRL 7.5 (GLOVE) ×2 IMPLANT
GLOVE BIOGEL PI IND STRL 8 (GLOVE) ×2 IMPLANT
GLOVE BIOGEL PI INDICATOR 7.5 (GLOVE) ×2
GLOVE BIOGEL PI INDICATOR 8 (GLOVE) ×2
GLOVE SURG SS PI 7.5 STRL IVOR (GLOVE) ×8 IMPLANT
GOWN STRL REUS W/ TWL XL LVL3 (GOWN DISPOSABLE) ×2 IMPLANT
GOWN STRL REUS W/TWL XL LVL3 (GOWN DISPOSABLE) ×3
KIT BASIN OR (CUSTOM PROCEDURE TRAY) ×4 IMPLANT
NEEDLE HYPO 25X1 1.5 SAFETY (NEEDLE) ×4 IMPLANT
NS IRRIG 1000ML POUR BTL (IV SOLUTION) ×4 IMPLANT
PACK ORTHO EXTREMITY (CUSTOM PROCEDURE TRAY) ×4 IMPLANT
PAD CAST 4YDX4 CTTN HI CHSV (CAST SUPPLIES) ×2 IMPLANT
PADDING CAST ABS 4INX4YD NS (CAST SUPPLIES) ×2
PADDING CAST ABS COTTON 4X4 ST (CAST SUPPLIES) ×2 IMPLANT
PADDING CAST COTTON 4X4 STRL (CAST SUPPLIES) ×3
SPONGE GAUZE 4X4 12PLY STER LF (GAUZE/BANDAGES/DRESSINGS) ×4 IMPLANT
SUT ETHIBOND 3-0 V-5 (SUTURE) ×4 IMPLANT
SUT PROLENE 6 0 P 1 18 (SUTURE) ×4 IMPLANT
SUT VIC AB 2-0 CT3 27 (SUTURE) IMPLANT
SUT VICRYL 4-0 PS2 18IN ABS (SUTURE) IMPLANT
SUT VICRYL RAPIDE 4/0 PS 2 (SUTURE) ×8 IMPLANT
SYR CONTROL 10ML LL (SYRINGE) ×4 IMPLANT
SYRINGE 10CC LL (SYRINGE) IMPLANT
TOWEL OR 17X24 6PK STRL BLUE (TOWEL DISPOSABLE) ×4 IMPLANT
UNDERPAD 30X30 INCONTINENT (UNDERPADS AND DIAPERS) ×4 IMPLANT

## 2015-08-10 NOTE — Op Note (Signed)
08/09/2015 - 08/10/2015  2:16 AM  PATIENT:  Dale Perez  60 y.o. male  PRE-OPERATIVE DIAGNOSIS:  Left thumb extensor tendon laceration  POST-OPERATIVE DIAGNOSIS:  Same  PROCEDURE:   1.  Left thumb wound excisional debridement of skin    2.  Left thumb extensor tendon repair, zone 2    3.  Left thumb traumatic wound closure, simple, 1.5 cm  SURGEON: Cliffton Astersavid A. Janee Mornhompson, MD  PHYSICIAN ASSISTANT: None  ANESTHESIA:  general  SPECIMENS:  None  DRAINS:   None  EBL:  less than 50 mL  PREOPERATIVE INDICATIONS:  Dale Perez is a  60 y.o. male with left thumb traumatic extensor tendon laceration  The risks benefits and alternatives were discussed with the patient preoperatively including but not limited to the risks of infection, bleeding, nerve injury, cardiopulmonary complications, the need for revision surgery, among others, and the patient verbalized understanding and consented to proceed.  OPERATIVE IMPLANTS: None  OPERATIVE PROCEDURE:  After receiving prophylactic antibiotics, the patient was escorted to the operative theatre and placed in a supine position.  General anesthesia was administered.  A surgical "time-out" was performed during which the planned procedure, proposed operative site, and the correct patient identity were compared to the operative consent and agreement confirmed by the circulating nurse according to current facility policy.  Following application of a tourniquet to the operative extremity, the exposed skin was pre-scrub with Hibiclens scrub brush before being formally prepped with Chloraprep and draped in the usual sterile fashion.  The limb was exsanguinated with an Esmarch bandage and the tourniquet inflated to approximately 100mmHg higher than systolic BP.  The wound was copiously irrigated.  The mostly transverse laceration was extended distally on the ulnar side.  This allowed for better reflection of the distal flap and observation of the laceration.   The skin edges of the laceration themselves were excisionally debrided to better healthy skin.  The extensor tendon was completely transected, and it was repaired with 3-0 Ethibond suture, with a grasping suture technique, with 4 core strand passes.  Additionally, a running 6-0 Prolene Silverskold suture was placed on the dorsal surface.  There was no gapping of the tendon, even with passive flexion to some degree at the IP joint.  The tourniquet was released, after some Marcaine with epinephrine instilled in the skin edges for postoperative pain control and additional hemostasis.  The surgically created incision extension was closed with 4-0 Vicryl Rapide interrupted suture, as was the traumatic laceration itself.  A short arm splint dressing was applied with a thumb spica plaster component, placing the thumb into extension, and he was awakened and taken to the recovery room in stable condition, breathing spontaneously.  DISPOSITION: He'll be discharged home today, with typical postop instructions.  No work until at least his first postop visit.  Worker's Compensation authorization will need to be obtained for continued care.  When he returns in 7-10 days, he should have a follow-on appointment with hand therapy to make a custom fabricated splint and begin extensor tendon rehabilitation for the thumb in zone 2.

## 2015-08-10 NOTE — Transfer of Care (Signed)
Immediate Anesthesia Transfer of Care Note  Patient: Dale Perez  Procedure(s) Performed: Procedure(s): TENDON REPAIR WITH WOUND CLOSURE (N/A) IRRIGATION AND DEBRIDEMENT EXTREMITY (Left)  Patient Location: PACU  Anesthesia Type:General  Level of Consciousness: awake, oriented, patient cooperative and responds to stimulation  Airway & Oxygen Therapy: Patient Spontanous Breathing and Patient connected to nasal cannula oxygen  Post-op Assessment: Report given to RN, Post -op Vital signs reviewed and stable and Patient moving all extremities X 4  Post vital signs: Reviewed and stable  Last Vitals:  Filed Vitals:   08/10/15 0000 08/10/15 0030  BP: 143/87 147/82  Pulse: 55 52  Temp:    Resp:      Last Pain:  Filed Vitals:   08/10/15 0227  PainSc: 6          Complications: No apparent anesthesia complications

## 2015-08-10 NOTE — Anesthesia Procedure Notes (Signed)
Procedure Name: Intubation Date/Time: 08/10/2015 1:23 AM Performed by: Melina SchoolsBANKS, [place] Sorn J Pre-anesthesia Checklist: Patient identified, Emergency Drugs available, Suction available, Patient being monitored and Timeout performed Patient Re-evaluated:Patient Re-evaluated prior to inductionOxygen Delivery Method: Circle system utilized Preoxygenation: Pre-oxygenation with 100% oxygen Intubation Type: IV induction, Rapid sequence and Cricoid Pressure applied Laryngoscope Size: Mac and 4 Grade View: Grade II Tube type: Oral Tube size: 8.0 mm Number of attempts: 1 Airway Equipment and Method: Stylet Placement Confirmation: ETT inserted through vocal cords under direct vision,  positive ETCO2 and breath sounds checked- equal and bilateral Secured at: 23 cm Tube secured with: Tape Dental Injury: Teeth and Oropharynx as per pre-operative assessment
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
## 2015-08-10 NOTE — Discharge Instructions (Signed)
Discharge Instructions   You have a dressing with a plaster splint incorporated in it. Move your fingers as much as possible, making a full fist and fully opening the fist. Elevate your hand to reduce pain & swelling of the digits.  Ice over the operative site may be helpful to reduce pain & swelling.  DO NOT USE HEAT. Pain medicine has been prescribed for you.  Use your medicine as needed over the first 48 hours, and then you can begin to taper your use.  You may use Tylenol in place of your prescribed pain medication, but not IN ADDITION to it. Leave the dressing in place until you return to our office.  You may shower, but keep the bandage clean & dry.  You may drive a car when you are off of prescription pain medications and can safely control your vehicle with both hands. Our office will call you to arrange follow-up   Please call [phone] during normal business hours or [phone] after hours for any problems. Including the following:  - excessive redness of the incisions - drainage for more than 4 days - fever of more than 101.5 F  *Please note that pain medications will not be refilled after hours or on weekends.  WORK STATUS: NO WORK UNTIL AT LEAST THE FIRST POSTOP APPOINTMENT IN 7-10 DAYS

## 2015-08-10 NOTE — Anesthesia Postprocedure Evaluation (Signed)
Anesthesia Post Note  Patient: Dale Perez  Procedure(s) Performed: Procedure(s) (LRB): TENDON REPAIR WITH WOUND CLOSURE (N/A) IRRIGATION AND DEBRIDEMENT EXTREMITY (Left)  Patient location during evaluation: PACU Anesthesia Type: General Level of consciousness: awake and alert Pain management: pain level controlled Vital Signs Assessment: post-procedure vital signs reviewed and stable Respiratory status: spontaneous breathing, nonlabored ventilation and respiratory function stable Cardiovascular status: blood pressure returned to baseline and stable Postop Assessment: no signs of nausea or vomiting Anesthetic complications: no    Last Vitals:  Filed Vitals:   08/10/15 0245 08/10/15 0248  BP: 154/80 154/90  Pulse: 84 82  Temp:  36.2 C  Resp: 19 19    Last Pain:  Filed Vitals:   08/10/15 0249  PainSc: 0-No pain                 Adison Jerger,W. EDMOND

## 2015-08-13 ENCOUNTER — Encounter (HOSPITAL_COMMUNITY): Payer: Self-pay | Admitting: Orthopedic Surgery

## 2016-02-29 DIAGNOSIS — H029 Unspecified disorder of eyelid: Secondary | ICD-10-CM | POA: Diagnosis not present

## 2016-05-23 DIAGNOSIS — Z0001 Encounter for general adult medical examination with abnormal findings: Secondary | ICD-10-CM | POA: Diagnosis not present

## 2016-05-23 DIAGNOSIS — F17290 Nicotine dependence, other tobacco product, uncomplicated: Secondary | ICD-10-CM | POA: Diagnosis not present

## 2016-05-23 DIAGNOSIS — Z6822 Body mass index (BMI) 22.0-22.9, adult: Secondary | ICD-10-CM | POA: Diagnosis not present

## 2016-05-23 DIAGNOSIS — Z1389 Encounter for screening for other disorder: Secondary | ICD-10-CM | POA: Diagnosis not present

## 2017-06-12 DIAGNOSIS — H6123 Impacted cerumen, bilateral: Secondary | ICD-10-CM | POA: Diagnosis not present

## 2017-06-12 DIAGNOSIS — H722X2 Other marginal perforations of tympanic membrane, left ear: Secondary | ICD-10-CM | POA: Diagnosis not present

## 2017-06-12 DIAGNOSIS — H7102 Cholesteatoma of attic, left ear: Secondary | ICD-10-CM | POA: Diagnosis not present

## 2017-06-12 DIAGNOSIS — H9193 Unspecified hearing loss, bilateral: Secondary | ICD-10-CM | POA: Diagnosis not present

## 2017-07-17 DIAGNOSIS — H906 Mixed conductive and sensorineural hearing loss, bilateral: Secondary | ICD-10-CM | POA: Diagnosis not present

## 2017-07-17 DIAGNOSIS — H9012 Conductive hearing loss, unilateral, left ear, with unrestricted hearing on the contralateral side: Secondary | ICD-10-CM | POA: Diagnosis not present

## 2017-07-17 DIAGNOSIS — H7102 Cholesteatoma of attic, left ear: Secondary | ICD-10-CM | POA: Diagnosis not present

## 2017-07-29 ENCOUNTER — Other Ambulatory Visit: Payer: Self-pay | Admitting: Otolaryngology

## 2017-07-29 DIAGNOSIS — H7102 Cholesteatoma of attic, left ear: Secondary | ICD-10-CM

## 2017-08-07 ENCOUNTER — Other Ambulatory Visit: Payer: Self-pay

## 2017-08-07 ENCOUNTER — Ambulatory Visit
Admission: RE | Admit: 2017-08-07 | Discharge: 2017-08-07 | Disposition: A | Discharge status: Home / Self Care | Payer: BLUE CROSS/BLUE SHIELD | Source: Ambulatory Visit | Attending: Otolaryngology | Admitting: Otolaryngology

## 2017-08-07 DIAGNOSIS — H7102 Cholesteatoma of attic, left ear: Secondary | ICD-10-CM

## 2017-09-11 DIAGNOSIS — H7102 Cholesteatoma of attic, left ear: Secondary | ICD-10-CM | POA: Diagnosis not present

## 2017-09-11 DIAGNOSIS — H9012 Conductive hearing loss, unilateral, left ear, with unrestricted hearing on the contralateral side: Secondary | ICD-10-CM | POA: Diagnosis not present

## 2018-08-04 HISTORY — PX: SHOULDER ARTHROSCOPY: SHX128

## 2018-08-18 DIAGNOSIS — Z6822 Body mass index (BMI) 22.0-22.9, adult: Secondary | ICD-10-CM | POA: Diagnosis not present

## 2018-08-18 DIAGNOSIS — Z1389 Encounter for screening for other disorder: Secondary | ICD-10-CM | POA: Diagnosis not present

## 2018-08-18 DIAGNOSIS — Z0001 Encounter for general adult medical examination with abnormal findings: Secondary | ICD-10-CM | POA: Diagnosis not present

## 2018-08-18 DIAGNOSIS — Z0000 Encounter for general adult medical examination without abnormal findings: Secondary | ICD-10-CM | POA: Diagnosis not present

## 2018-11-03 ENCOUNTER — Encounter: Payer: Self-pay | Admitting: Internal Medicine

## 2019-02-04 HISTORY — PX: KNEE CARTILAGE SURGERY: SHX688

## 2019-02-08 DIAGNOSIS — H7102 Cholesteatoma of attic, left ear: Secondary | ICD-10-CM | POA: Diagnosis not present

## 2019-02-08 DIAGNOSIS — H9012 Conductive hearing loss, unilateral, left ear, with unrestricted hearing on the contralateral side: Secondary | ICD-10-CM | POA: Diagnosis not present

## 2019-02-08 DIAGNOSIS — H6123 Impacted cerumen, bilateral: Secondary | ICD-10-CM | POA: Diagnosis not present

## 2019-03-28 DIAGNOSIS — Z20822 Contact with and (suspected) exposure to covid-19: Secondary | ICD-10-CM | POA: Diagnosis not present

## 2019-04-04 DIAGNOSIS — I714 Abdominal aortic aneurysm, without rupture, unspecified: Secondary | ICD-10-CM

## 2019-04-04 HISTORY — DX: Abdominal aortic aneurysm, without rupture, unspecified: I71.40

## 2019-04-04 HISTORY — DX: Abdominal aortic aneurysm, without rupture: I71.4

## 2019-04-09 ENCOUNTER — Emergency Department (HOSPITAL_COMMUNITY): Payer: BC Managed Care – PPO

## 2019-04-09 ENCOUNTER — Inpatient Hospital Stay (HOSPITAL_COMMUNITY)
Admission: EM | Admit: 2019-04-09 | Discharge: 2019-04-18 | DRG: 199 | Disposition: A | Discharge status: Rehabilitation Facility | Payer: BC Managed Care – PPO | Attending: General Surgery | Admitting: General Surgery

## 2019-04-09 ENCOUNTER — Encounter (HOSPITAL_COMMUNITY): Payer: Self-pay | Admitting: Emergency Medicine

## 2019-04-09 DIAGNOSIS — S060X9A Concussion with loss of consciousness of unspecified duration, initial encounter: Secondary | ICD-10-CM | POA: Diagnosis not present

## 2019-04-09 DIAGNOSIS — J154 Pneumonia due to other streptococci: Secondary | ICD-10-CM | POA: Diagnosis not present

## 2019-04-09 DIAGNOSIS — W11XXXA Fall on and from ladder, initial encounter: Secondary | ICD-10-CM | POA: Diagnosis present

## 2019-04-09 DIAGNOSIS — S2232XA Fracture of one rib, left side, initial encounter for closed fracture: Secondary | ICD-10-CM

## 2019-04-09 DIAGNOSIS — Z20822 Contact with and (suspected) exposure to covid-19: Secondary | ICD-10-CM | POA: Diagnosis present

## 2019-04-09 DIAGNOSIS — S2242XA Multiple fractures of ribs, left side, initial encounter for closed fracture: Secondary | ICD-10-CM | POA: Diagnosis present

## 2019-04-09 DIAGNOSIS — S27321A Contusion of lung, unilateral, initial encounter: Secondary | ICD-10-CM | POA: Diagnosis present

## 2019-04-09 DIAGNOSIS — M7989 Other specified soft tissue disorders: Secondary | ICD-10-CM | POA: Diagnosis not present

## 2019-04-09 DIAGNOSIS — I4891 Unspecified atrial fibrillation: Secondary | ICD-10-CM | POA: Diagnosis not present

## 2019-04-09 DIAGNOSIS — F17210 Nicotine dependence, cigarettes, uncomplicated: Secondary | ICD-10-CM | POA: Diagnosis not present

## 2019-04-09 DIAGNOSIS — Z886 Allergy status to analgesic agent status: Secondary | ICD-10-CM

## 2019-04-09 DIAGNOSIS — S272XXA Traumatic hemopneumothorax, initial encounter: Principal | ICD-10-CM | POA: Diagnosis present

## 2019-04-09 DIAGNOSIS — R0602 Shortness of breath: Secondary | ICD-10-CM | POA: Diagnosis not present

## 2019-04-09 DIAGNOSIS — S80812A Abrasion, left lower leg, initial encounter: Secondary | ICD-10-CM | POA: Diagnosis present

## 2019-04-09 DIAGNOSIS — J942 Hemothorax: Secondary | ICD-10-CM | POA: Diagnosis not present

## 2019-04-09 DIAGNOSIS — Y93H2 Activity, gardening and landscaping: Secondary | ICD-10-CM

## 2019-04-09 DIAGNOSIS — S199XXA Unspecified injury of neck, initial encounter: Secondary | ICD-10-CM | POA: Diagnosis not present

## 2019-04-09 DIAGNOSIS — T1490XA Injury, unspecified, initial encounter: Secondary | ICD-10-CM

## 2019-04-09 DIAGNOSIS — Z880 Allergy status to penicillin: Secondary | ICD-10-CM

## 2019-04-09 DIAGNOSIS — S3991XA Unspecified injury of abdomen, initial encounter: Secondary | ICD-10-CM | POA: Diagnosis not present

## 2019-04-09 DIAGNOSIS — I714 Abdominal aortic aneurysm, without rupture, unspecified: Secondary | ICD-10-CM

## 2019-04-09 DIAGNOSIS — N281 Cyst of kidney, acquired: Secondary | ICD-10-CM | POA: Diagnosis not present

## 2019-04-09 DIAGNOSIS — R0689 Other abnormalities of breathing: Secondary | ICD-10-CM | POA: Diagnosis not present

## 2019-04-09 DIAGNOSIS — S2242XS Multiple fractures of ribs, left side, sequela: Secondary | ICD-10-CM | POA: Diagnosis not present

## 2019-04-09 DIAGNOSIS — S060X0A Concussion without loss of consciousness, initial encounter: Secondary | ICD-10-CM

## 2019-04-09 DIAGNOSIS — K5900 Constipation, unspecified: Secondary | ICD-10-CM | POA: Diagnosis not present

## 2019-04-09 DIAGNOSIS — J939 Pneumothorax, unspecified: Secondary | ICD-10-CM | POA: Diagnosis not present

## 2019-04-09 DIAGNOSIS — R519 Headache, unspecified: Secondary | ICD-10-CM | POA: Diagnosis not present

## 2019-04-09 DIAGNOSIS — S22068A Other fracture of T7-T8 thoracic vertebra, initial encounter for closed fracture: Secondary | ICD-10-CM | POA: Diagnosis not present

## 2019-04-09 DIAGNOSIS — W11XXXD Fall on and from ladder, subsequent encounter: Secondary | ICD-10-CM | POA: Diagnosis not present

## 2019-04-09 DIAGNOSIS — R404 Transient alteration of awareness: Secondary | ICD-10-CM | POA: Diagnosis not present

## 2019-04-09 DIAGNOSIS — S22069D Unspecified fracture of T7-T8 vertebra, subsequent encounter for fracture with routine healing: Secondary | ICD-10-CM | POA: Diagnosis not present

## 2019-04-09 DIAGNOSIS — S2242XD Multiple fractures of ribs, left side, subsequent encounter for fracture with routine healing: Secondary | ICD-10-CM | POA: Diagnosis not present

## 2019-04-09 DIAGNOSIS — S3993XA Unspecified injury of pelvis, initial encounter: Secondary | ICD-10-CM | POA: Diagnosis not present

## 2019-04-09 DIAGNOSIS — R52 Pain, unspecified: Secondary | ICD-10-CM | POA: Diagnosis not present

## 2019-04-09 DIAGNOSIS — R0902 Hypoxemia: Secondary | ICD-10-CM | POA: Diagnosis not present

## 2019-04-09 DIAGNOSIS — S0990XA Unspecified injury of head, initial encounter: Secondary | ICD-10-CM | POA: Diagnosis not present

## 2019-04-09 DIAGNOSIS — S270XXA Traumatic pneumothorax, initial encounter: Secondary | ICD-10-CM | POA: Diagnosis not present

## 2019-04-09 DIAGNOSIS — R5381 Other malaise: Secondary | ICD-10-CM | POA: Diagnosis not present

## 2019-04-09 LAB — PROTIME-INR
INR: 0.9 (ref 0.8–1.2)
Prothrombin Time: 12.5 seconds (ref 11.4–15.2)

## 2019-04-09 LAB — CBC
HCT: 49.3 % (ref 39.0–52.0)
Hemoglobin: 15.2 g/dL (ref 13.0–17.0)
MCH: 28.6 pg (ref 26.0–34.0)
MCHC: 30.8 g/dL (ref 30.0–36.0)
MCV: 92.7 fL (ref 80.0–100.0)
Platelets: 257 10*3/uL (ref 150–400)
RBC: 5.32 MIL/uL (ref 4.22–5.81)
RDW: 14.2 % (ref 11.5–15.5)
WBC: 10.6 10*3/uL — ABNORMAL HIGH (ref 4.0–10.5)
nRBC: 0 % (ref 0.0–0.2)

## 2019-04-09 LAB — LACTIC ACID, PLASMA: Lactic Acid, Venous: 2.1 mmol/L (ref 0.5–1.9)

## 2019-04-09 LAB — COMPREHENSIVE METABOLIC PANEL
ALT: 26 U/L (ref 0–44)
AST: 27 U/L (ref 15–41)
Albumin: 4 g/dL (ref 3.5–5.0)
Alkaline Phosphatase: 104 U/L (ref 38–126)
Anion gap: 12 (ref 5–15)
BUN: 20 mg/dL (ref 8–23)
CO2: 22 mmol/L (ref 22–32)
Calcium: 9.5 mg/dL (ref 8.9–10.3)
Chloride: 105 mmol/L (ref 98–111)
Creatinine, Ser: 0.97 mg/dL (ref 0.61–1.24)
GFR calc Af Amer: >60 mL/min (ref >60)
GFR calc non Af Amer: >60 mL/min (ref >60)
Glucose, Bld: 135 mg/dL — ABNORMAL HIGH (ref 70–99)
Potassium: 4.6 mmol/L (ref 3.5–5.1)
Sodium: 139 mmol/L (ref 135–145)
Total Bilirubin: 0.4 mg/dL (ref 0.3–1.2)
Total Protein: 6.8 g/dL (ref 6.5–8.1)

## 2019-04-09 LAB — I-STAT CHEM 8, ED
BUN: 22 mg/dL (ref 8–23)
Calcium, Ion: 1.08 mmol/L — ABNORMAL LOW (ref 1.15–1.40)
Chloride: 104 mmol/L (ref 98–111)
Creatinine, Ser: 0.9 mg/dL (ref 0.61–1.24)
Glucose, Bld: 132 mg/dL — ABNORMAL HIGH (ref 70–99)
HCT: 47 % (ref 39.0–52.0)
Hemoglobin: 16 g/dL (ref 13.0–17.0)
Potassium: 4.4 mmol/L (ref 3.5–5.1)
Sodium: 139 mmol/L (ref 135–145)
TCO2: 27 mmol/L (ref 22–32)

## 2019-04-09 LAB — ETHANOL: Alcohol, Ethyl (B): <10 mg/dL (ref <10)

## 2019-04-09 LAB — RESPIRATORY PANEL BY RT PCR (FLU A&B, COVID)
Influenza A by PCR: NEGATIVE
Influenza B by PCR: NEGATIVE
SARS Coronavirus 2 by RT PCR: NEGATIVE

## 2019-04-09 MED ORDER — GABAPENTIN 100 MG PO CAPS
100.0000 mg | ORAL_CAPSULE | Freq: Three times a day (TID) | ORAL | Status: DC
  Administered 2019-04-09 – 2019-04-18 (×27): 100 mg via ORAL
  Filled 2019-04-09 (×27): qty 1

## 2019-04-09 MED ORDER — METHOCARBAMOL 1000 MG/10ML IJ SOLN
1000.0000 mg | Freq: Once | INTRAVENOUS | Status: AC
  Administered 2019-04-09: 1000 mg via INTRAVENOUS
  Filled 2019-04-09: qty 10

## 2019-04-09 MED ORDER — OXYCODONE HCL 5 MG PO TABS
10.0000 mg | ORAL_TABLET | ORAL | Status: DC | PRN
  Administered 2019-04-10 – 2019-04-18 (×29): 10 mg via ORAL
  Filled 2019-04-09 (×29): qty 2

## 2019-04-09 MED ORDER — FENTANYL CITRATE (PF) 100 MCG/2ML IJ SOLN
INTRAMUSCULAR | Status: AC | PRN
  Administered 2019-04-09: 75 ug via INTRAVENOUS

## 2019-04-09 MED ORDER — HYDROMORPHONE HCL 1 MG/ML IJ SOLN
1.0000 mg | INTRAMUSCULAR | Status: AC | PRN
  Administered 2019-04-10 – 2019-04-15 (×12): 1 mg via INTRAVENOUS
  Filled 2019-04-09 (×13): qty 1

## 2019-04-09 MED ORDER — POTASSIUM CHLORIDE IN NACL 20-0.9 MEQ/L-% IV SOLN
INTRAVENOUS | Status: DC
  Filled 2019-04-09 (×3): qty 1000

## 2019-04-09 MED ORDER — FENTANYL CITRATE (PF) 100 MCG/2ML IJ SOLN
75.0000 ug | INTRAMUSCULAR | Status: AC | PRN
  Administered 2019-04-09 (×2): 75 ug via INTRAVENOUS
  Filled 2019-04-09 (×2): qty 2

## 2019-04-09 MED ORDER — IPRATROPIUM-ALBUTEROL 0.5-2.5 (3) MG/3ML IN SOLN
3.0000 mL | RESPIRATORY_TRACT | Status: DC | PRN
  Administered 2019-04-11 – 2019-04-13 (×3): 3 mL via RESPIRATORY_TRACT
  Filled 2019-04-09 (×3): qty 3

## 2019-04-09 MED ORDER — METOPROLOL TARTRATE 5 MG/5ML IV SOLN: 5.0000 mg | Freq: Four times a day (QID) | INTRAVENOUS | Status: DC | PRN

## 2019-04-09 MED ORDER — ONDANSETRON 4 MG PO TBDP: 4.0000 mg | ORAL_TABLET | Freq: Four times a day (QID) | ORAL | Status: DC | PRN

## 2019-04-09 MED ORDER — METHOCARBAMOL 1000 MG/10ML IJ SOLN
1000.0000 mg | Freq: Three times a day (TID) | INTRAVENOUS | Status: DC | PRN
  Administered 2019-04-11 – 2019-04-12 (×2): 1000 mg via INTRAVENOUS
  Filled 2019-04-09 (×3): qty 10

## 2019-04-09 MED ORDER — ENOXAPARIN SODIUM 40 MG/0.4ML ~~LOC~~ SOLN
40.0000 mg | SUBCUTANEOUS | Status: DC
  Administered 2019-04-10 – 2019-04-18 (×9): 40 mg via SUBCUTANEOUS
  Filled 2019-04-09 (×9): qty 0.4

## 2019-04-09 MED ORDER — ONDANSETRON HCL 4 MG/2ML IJ SOLN: 4.0000 mg | Freq: Four times a day (QID) | INTRAMUSCULAR | Status: DC | PRN

## 2019-04-09 MED ORDER — OXYCODONE HCL 5 MG PO TABS: 5.0000 mg | ORAL_TABLET | ORAL | Status: DC | PRN

## 2019-04-09 MED ORDER — ACETAMINOPHEN 325 MG PO TABS
650.0000 mg | ORAL_TABLET | Freq: Four times a day (QID) | ORAL | Status: AC
  Administered 2019-04-10 – 2019-04-19 (×36): 650 mg via ORAL
  Filled 2019-04-09 (×35): qty 2

## 2019-04-09 MED ORDER — METHOCARBAMOL 1000 MG/10ML IJ SOLN
1000.0000 mg | Freq: Once | INTRAMUSCULAR | Status: DC
  Filled 2019-04-09: qty 10

## 2019-04-09 MED ORDER — IOHEXOL 300 MG/ML  SOLN
100.0000 mL | Freq: Once | INTRAMUSCULAR | Status: AC | PRN
  Administered 2019-04-09: 100 mL via INTRAVENOUS

## 2019-04-09 NOTE — ED Notes (Signed)
Family updated as to patient's status.  Gladstone Lighter TRN [phone]
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
## 2019-04-09 NOTE — ED Notes (Signed)
Jeans cut off by ems with belt brown boots with socks  Shirt  Phone up with the pt.  opockets checked  85.00 in currency  Pocket full of change  5.88 in change  2 pocket knives 2 big sets of keys   Money clip  To security  Receipt taken to pts chart

## 2019-04-09 NOTE — ED Notes (Signed)
Pt placed on NRB.

## 2019-04-09 NOTE — H&P (Signed)
Dale Perez is an 64 y.o. male.   Chief Complaint: Left rib pain after fall from ladder HPI: Dale Perez was up almost 20 feet on a ladder cutting down a limb in his yard.  When the limb fell, it swept the ladder out and he fell landing on his left side.  Brief loss of consciousness.  He was transported as a level 2 trauma.  Work-up has revealed multiple left-sided rib fractures with a tiny pneumothorax and I was asked to see him for admission.  He complains of pain along his left ribs as well as pain from an abrasion behind his left knee.  History reviewed. No pertinent past medical history.  History reviewed. No pertinent surgical history.  No family history on file. Social History:  has no history on file for tobacco, alcohol, and drug.  Allergies:  Allergies  Allergen Reactions  . Aspirin   . Penicillins     (Not in a hospital admission)   Results for orders placed or performed during the hospital encounter of 04/09/19 (from the past 48 hour(s))  Lactic acid, plasma     Status: Abnormal   Collection Time: 04/09/19  4:40 PM  Result Value Ref Range   Lactic Acid, Venous 2.1 (HH) 0.5 - 1.9 mmol/L    Comment: CRITICAL RESULT CALLED TO, READ BACK BY AND VERIFIED WITH: C.CHRISCO RN @ 1746 04/09/2019 BY C.EDENS Performed at [facility], [address] [place], [place] [address]   Comprehensive metabolic panel     Status: Abnormal   Collection Time: 04/09/19  4:45 PM  Result Value Ref Range   Sodium 139 135 - 145 mmol/L   Potassium 4.6 3.5 - 5.1 mmol/L   Chloride 105 98 - 111 mmol/L   CO2 22 22 - 32 mmol/L   Glucose, Bld 135 (H) 70 - 99 mg/dL    Comment: Glucose reference range applies only to samples taken after fasting for at least 8 hours.   BUN 20 8 - 23 mg/dL   Creatinine, Ser 6.040.97 0.61 - 1.24 mg/dL   Calcium 9.5 8.9 - 54.010.3 mg/dL   Total Protein 6.8 6.5 - 8.1 g/dL   Albumin 4.0 3.5 - 5.0 g/dL   AST 27 15 - 41 U/L   ALT 26 0 - 44 U/L   Alkaline Phosphatase 104 38  - 126 U/L   Total Bilirubin 0.4 0.3 - 1.2 mg/dL   GFR calc non Af Amer >60 >60 mL/min   GFR calc Af Amer >60 >60 mL/min   Anion gap 12 5 - 15    Comment: Performed at [facility], [address] [place], [place] [address]  CBC     Status: Abnormal   Collection Time: 04/09/19  4:45 PM  Result Value Ref Range   WBC 10.6 (H) 4.0 - 10.5 K/uL   RBC 5.32 4.22 - 5.81 MIL/uL   Hemoglobin 15.2 13.0 - 17.0 g/dL   HCT 14.749.3 82.939.0 - 56.252.0 %   MCV 92.7 80.0 - 100.0 fL   MCH 28.6 26.0 - 34.0 pg   MCHC 30.8 30.0 - 36.0 g/dL   RDW 13.014.2 86.511.5 - 78.415.5 %   Platelets 257 150 - 400 K/uL   nRBC 0.0 0.0 - 0.2 %    Comment: Performed at [facility], [address] [place], [place] [address]  Ethanol     Status: None   Collection Time: 04/09/19  4:45 PM  Result Value Ref Range  Alcohol, Ethyl (B) <10 <10 mg/dL    Comment: (NOTE) Lowest detectable limit for serum alcohol is 10 mg/dL. For medical purposes only. Performed at [facility], [address] [place], [place] [address]   Protime-INR     Status: None   Collection Time: 04/09/19  4:45 PM  Result Value Ref Range   Prothrombin Time 12.5 11.4 - 15.2 seconds   INR 0.9 0.8 - 1.2    Comment: (NOTE) INR goal varies based on device and disease states. Performed at [facility], [address], [place], [place] [address]   Sample to Blood Bank     Status: None   Collection Time: 04/09/19  4:45 PM  Result Value Ref Range   Blood Bank Specimen SAMPLE AVAILABLE FOR TESTING    Sample Expiration      04/12/2019,2359 Performed at [facility], [address], [place], [place] [address]   I-Stat Chem 8, ED     Status: Abnormal   Collection Time: 04/09/19  4:47 PM  Result Value Ref Range   Sodium 139 135 - 145 mmol/L   Potassium 4.4 3.5 - 5.1 mmol/L   Chloride 104 98 - 111 mmol/L   BUN 22 8 - 23 mg/dL   Creatinine, Ser 2.62 0.61 - 1.24 mg/dL   Glucose, Bld 035 (H) 70 - 99 mg/dL    Comment: Glucose reference range  applies only to samples taken after fasting for at least 8 hours.   Calcium, Ion 1.08 (L) 1.15 - 1.40 mmol/L   TCO2 27 22 - 32 mmol/L   Hemoglobin 16.0 13.0 - 17.0 g/dL   HCT 59.7 41.6 - 38.4 %   CT HEAD WO CONTRAST  Result Date: 04/09/2019 CLINICAL DATA:  Head trauma, headache, fell 17 feet from a tree. No obvious deformities. Abrasion to left posterior thigh, positive loss of consciousness, GCS 15 EXAM: CT HEAD WITHOUT CONTRAST CT CERVICAL SPINE WITHOUT CONTRAST TECHNIQUE: Multidetector CT imaging of the head and cervical spine was performed following the standard protocol without intravenous contrast. Multiplanar CT image reconstructions of the cervical spine were also generated. COMPARISON:  CT 08/07/2017 FINDINGS: CT HEAD FINDINGS Brain: No evidence of acute infarction, hemorrhage, hydrocephalus, extra-axial collection or mass lesion/mass effect. Symmetric prominence of the ventricles, cisterns and sulci compatible with parenchymal volume loss. Patchy areas of white matter hypoattenuation are most compatible with chronic microvascular angiopathy. Vascular: Atherosclerotic calcification of the carotid siphons. No hyperdense vessel. Skull: No calvarial fracture or scalp swelling. Mild soft tissue thickening superficial to the left zygoma. Most compatible with a small dermal inclusion cyst. Few benign scalp calcifications are present. Sinuses/Orbits: Minimal mucosal thickening in the ethmoid sinuses. Remaining paranasal sinuses are predominantly clear. Hypo pneumatization of the mastoids with a left mastoid effusion and hyperostotic features similar to prior and suggesting chronicity. Soft tissue attenuation attic of the left middle ear cavity with destruction of the left ossicular chain most suspicious for a left cholesteatoma. Other: Small amount of debris in the external auditory canal on the right. CT CERVICAL SPINE FINDINGS Alignment: Cervical stabilization collar is in place at the time of  examination. Preservation of the normal cervical lordosis without traumatic listhesis. No abnormally widened, perched or jumped facets. Normal alignment of the craniocervical and atlantoaxial articulations. Skull base and vertebrae: No acute fracture. No primary bone lesion or focal pathologic process. Soft tissues and spinal canal: No pre or paravertebral fluid or swelling. No visible canal hematoma. Disc levels: Multilevel intervertebral disc  height loss with spondylitic endplate changes. Features are most pronounced C4-C7 where disc osteophyte complexes efface the ventral thecal sac without significant canal stenosis. Mild uncinate spurring and facet arthropathic changes at these levels do result in mild bilateral neural foramina narrowing. Upper chest: No acute abnormality in the upper chest or imaged lung apices. Other: Normal thyroid. Question postsurgical changes along the posterior left thyroid lobe. Cervical carotid atherosclerosis is noted. IMPRESSION: 1. No CT evidence of acute intracranial process. No acute scalp swelling or calvarial fracture. 2. Mild parenchymal volume loss and chronic microvascular angiopathy. 3. Chronic left-sided mastoiditis. 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 4. No acute fracture or traumatic listhesis of the cervical spine. 5. Mild multilevel intervertebral spondylitic changes, most pronounced C4-C7. Detailed above. Electronically Signed   By: Lovena Le M.D.   On: 04/09/2019 17:26   CT Chest W Contrast  Result Date: 04/09/2019 CLINICAL DATA:  Lower back pain, trauma, fall approximately 17 foot from tree PE stating is EXAM: CT CHEST, ABDOMEN, AND PELVIS WITH CONTRAST TECHNIQUE: Multidetector CT imaging of the chest, abdomen and pelvis was performed following the standard protocol during bolus administration of intravenous contrast. CONTRAST:  176mL  OMNIPAQUE IOHEXOL 300 MG/ML  SOLN COMPARISON:  CT abdomen pelvis 07/24/2015 FINDINGS: CT CHEST FINDINGS Cardiovascular: The aorta is normal caliber. No intramural hematoma, dissection flap or other acute luminal abnormality of the aorta is seen. No periaortic stranding or hemorrhage. Atheromatous plaque seen throughout the thoracic aorta and within the normally branching proximal great vessels including some mild proximal atheromatous narrowing of the left common carotid and subclavian arteries and right vertebral artery. Normal heart size. No pericardial effusion. Coronary artery calcifications are present central pulmonary arteries are normal caliber. No large central filling defects on this non tailored examination of the pulmonary arteries. Mediastinum/Nodes: No mediastinal fluid or gas. Normal thyroid gland and thoracic inlet. No acute abnormality of the trachea or esophagus. No worrisome mediastinal, hilar or axillary adenopathy. Lungs/Pleura: Small left apical and medial pneumothorax and trace pleural thickening, likely hemothorax. Dependent ground-glass towards the lung bases most likely reflects atelectasis though some underlying pulmonary contusion could have a similar appearance. No other acute traumatic findings of the lung parenchyma. Musculoskeletal: Posterior left fourth through twelfth minimally displaced rib fractures. Additional anterior left second through sixth rib fractures. Findings result in segmental fractures of 4th to 6th ribs. No visible right rib fractures. Scapula appear intact. No acute traumatic abnormality of the imaged shoulder girdles. No sternal fracture is seen. Complete fusion across the sternomanubrial joint is noted incidentally. Subtle sclerotic band subjacent to the superior endplate cortex at T8 with some mild anterior wedging may reflect a subcortical impaction fracture. No adjacent paraspinal hemorrhage or visible canal hematoma. CT ABDOMEN PELVIS FINDINGS Hepatobiliary:  No direct hepatic injury or perihepatic hematoma. Small subcentimeter hypoattenuating foci in the anterior left lobe and posterior right lobe liver too small to fully characterize on CT imaging but statistically likely benign. No focal liver abnormality is seen. No gallstones, gallbladder wall thickening, or biliary dilatation. Mild low-attenuation nodule at the tip of the gallbladder fundus, likely adenomyomatosis. Pancreas: Normal uniform enhancement of the pancreas. No evidence of pancreatic contusion or ductal disruption. No peripancreatic inflammation. Spleen: No direct splenic injury or perisplenic hematoma. Adrenals/Urinary Tract: No adrenal hemorrhage or suspicious adrenal lesions. 4.5 cm cystic appearing lesion in the upper pole right kidney with thin inferior  septation, Bosniak II. Few punctate nonobstructing calculi in both kidneys as well as several vascular calcifications. No obstructive urolithiasis or hydronephrosis. Kidneys enhance and excrete symmetrically without extravasation of contrast on excretory phase imaging. No evidence of direct bladder injury. Stomach/Bowel: Distal esophagus, stomach and duodenum are unremarkable. No small bowel dilatation or wall thickening. No colonic dilatation or wall thickening. The appendix is surgically absent. Scattered colonic diverticula without focal pericolonic inflammation to suggest diverticulitis. No mesenteric hematoma or mesenteric contusion is evident. Vascular/Lymphatic: Atherosclerotic plaque within the normal caliber aorta. Fusiform aneurysmal dilatation of the infrarenal abdominal aorta to 4.4 cm in size with eccentric calcified and noncalcified mural plaque. Additional plaque noted in the proximal great vessels including mild narrowing in the proximal splenic artery and at the IMA origin. Atheromatous disease is seen extending into the proximal inflow and outflow levels. Moderate narrowing at the distal right common iliac at the level of the  bifurcation. No periaortic stranding or hemorrhage. No evidence of acute vascular injury or dissection. Retroaortic left renal vein is noted. No suspicious or enlarged lymph nodes in the included lymphatic chains. Reproductive: Mild prostatomegaly. Coarse eccentric benign calcifications. Other: No abdominopelvic free fluid or air. No large abdominal wall dehiscence. Contusive changes noted across the left flank and chest wall. No bowel containing hernias. Musculoskeletal: Multilevel degenerative changes noted in the lower lumbar spine. Acute fracture or traumatic listhesis. Degenerative retrolisthesis L5 on S1 is noted with maximal discogenic changes at this level. Bony pelvis is intact. Proximal femora are intact and well seated within the acetabula. IMPRESSION: Traumatic 1. Posterior left fourth through twelfth minimally displaced rib fractures. Additional anterior left second through sixth rib fractures. Given the extent of fractures including contiguous segmental fractures, close clinical assessment for flail chest morphology is recommended. 2. Small left apical and medial pneumothorax and trace pleural thickening, likely hemothorax. Some dependent atelectasis versus pulmonary contusion noted in bases. 3. Subtle sclerotic band subjacent to the superior endplate cortex at T8 with some mild anterior wedging may reflect a subcortical impaction fracture. No adjacent paraspinal hemorrhage or visible canal hematoma. Nontraumatic 1. 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 2. Coronary artery atherosclerosis. 3. Colonic diverticulosis without evidence of acute diverticulitis. 4. 4.4 cm infrarenal abdominal aortic aneurysm. Reflects interval enlargement since 2017 comparison. Recommend followup by ultrasound in 1 year. This recommendation follows ACR consensus guidelines: White Paper of the ACR Incidental Findings Committee II on Vascular Findings. J Am Coll Radiol 2013; 10:789-794. Aortic  aneurysm NOS (ICD10-I71.9). 5. Multilevel degenerative changes in the lower lumbar spine with associated grade 1 retrolisthesis L5 on S1. These results were called by telephone at the time of interpretation on 04/09/2019 at 5:46 pm to provider [facility] , who verbally acknowledged these results. Electronically Signed   By: Kreg Shropshire M.D.   On: 04/09/2019 17:48   CT CERVICAL SPINE WO CONTRAST  Result Date: 04/09/2019 CLINICAL DATA:  Head trauma, headache, fell 17 feet from a tree. No obvious deformities. Abrasion to left posterior thigh, positive loss of consciousness, GCS 15 EXAM: CT HEAD WITHOUT CONTRAST CT CERVICAL SPINE WITHOUT CONTRAST TECHNIQUE: Multidetector CT imaging of the head and cervical spine was performed following the standard protocol without intravenous contrast. Multiplanar CT image reconstructions of the cervical spine were also generated. COMPARISON:  CT 08/07/2017 FINDINGS: CT HEAD FINDINGS Brain: No evidence of acute infarction, hemorrhage, hydrocephalus, extra-axial collection or mass lesion/mass effect. Symmetric prominence of the ventricles, cisterns and sulci compatible with parenchymal  volume loss. Patchy areas of white matter hypoattenuation are most compatible with chronic microvascular angiopathy. Vascular: Atherosclerotic calcification of the carotid siphons. No hyperdense vessel. Skull: No calvarial fracture or scalp swelling. Mild soft tissue thickening superficial to the left zygoma. Most compatible with a small dermal inclusion cyst. Few benign scalp calcifications are present. Sinuses/Orbits: Minimal mucosal thickening in the ethmoid sinuses. Remaining paranasal sinuses are predominantly clear. Hypo pneumatization of the mastoids with a left mastoid effusion and hyperostotic features similar to prior and suggesting chronicity. Soft tissue attenuation attic of the left middle ear cavity with destruction of the left ossicular chain most suspicious for a left cholesteatoma.  Other: Small amount of debris in the external auditory canal on the right. CT CERVICAL SPINE FINDINGS Alignment: Cervical stabilization collar is in place at the time of examination. Preservation of the normal cervical lordosis without traumatic listhesis. No abnormally widened, perched or jumped facets. Normal alignment of the craniocervical and atlantoaxial articulations. Skull base and vertebrae: No acute fracture. No primary bone lesion or focal pathologic process. Soft tissues and spinal canal: No pre or paravertebral fluid or swelling. No visible canal hematoma. Disc levels: Multilevel intervertebral disc height loss with spondylitic endplate changes. Features are most pronounced C4-C7 where disc osteophyte complexes efface the ventral thecal sac without significant canal stenosis. Mild uncinate spurring and facet arthropathic changes at these levels do result in mild bilateral neural foramina narrowing. Upper chest: No acute abnormality in the upper chest or imaged lung apices. Other: Normal thyroid. Question postsurgical changes along the posterior left thyroid lobe. Cervical carotid atherosclerosis is noted. IMPRESSION: 1. No CT evidence of acute intracranial process. No acute scalp swelling or calvarial fracture. 2. Mild parenchymal volume loss and chronic microvascular angiopathy. 3. Chronic left-sided mastoiditis. 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 4. No acute fracture or traumatic listhesis of the cervical spine. 5. Mild multilevel intervertebral spondylitic changes, most pronounced C4-C7. Detailed above. Electronically Signed   By: Kreg ShropshirePrice  DeHay M.D.   On: 04/09/2019 17:26   CT ABDOMEN PELVIS W CONTRAST  Result Date: 04/09/2019 CLINICAL DATA:  Lower back pain, trauma, fall approximately 17 foot from tree PE stating is EXAM: CT CHEST, ABDOMEN, AND PELVIS WITH CONTRAST  TECHNIQUE: Multidetector CT imaging of the chest, abdomen and pelvis was performed following the standard protocol during bolus administration of intravenous contrast. CONTRAST:  100mL OMNIPAQUE IOHEXOL 300 MG/ML  SOLN COMPARISON:  CT abdomen pelvis 07/24/2015 FINDINGS: CT CHEST FINDINGS Cardiovascular: The aorta is normal caliber. No intramural hematoma, dissection flap or other acute luminal abnormality of the aorta is seen. No periaortic stranding or hemorrhage. Atheromatous plaque seen throughout the thoracic aorta and within the normally branching proximal great vessels including some mild proximal atheromatous narrowing of the left common carotid and subclavian arteries and right vertebral artery. Normal heart size. No pericardial effusion. Coronary artery calcifications are present central pulmonary arteries are normal caliber. No large central filling defects on this non tailored examination of the pulmonary arteries. Mediastinum/Nodes: No mediastinal fluid or gas. Normal thyroid gland and thoracic inlet. No acute abnormality of the trachea or esophagus. No worrisome mediastinal, hilar or axillary adenopathy. Lungs/Pleura: Small left apical and medial pneumothorax and trace pleural thickening, likely hemothorax. Dependent ground-glass towards the lung bases most likely reflects atelectasis though some underlying pulmonary contusion could have a similar appearance. No other acute traumatic findings of the lung parenchyma. Musculoskeletal:  Posterior left fourth through twelfth minimally displaced rib fractures. Additional anterior left second through sixth rib fractures. Findings result in segmental fractures of 4th to 6th ribs. No visible right rib fractures. Scapula appear intact. No acute traumatic abnormality of the imaged shoulder girdles. No sternal fracture is seen. Complete fusion across the sternomanubrial joint is noted incidentally. Subtle sclerotic band subjacent to the superior endplate cortex at  T8 with some mild anterior wedging may reflect a subcortical impaction fracture. No adjacent paraspinal hemorrhage or visible canal hematoma. CT ABDOMEN PELVIS FINDINGS Hepatobiliary: No direct hepatic injury or perihepatic hematoma. Small subcentimeter hypoattenuating foci in the anterior left lobe and posterior right lobe liver too small to fully characterize on CT imaging but statistically likely benign. No focal liver abnormality is seen. No gallstones, gallbladder wall thickening, or biliary dilatation. Mild low-attenuation nodule at the tip of the gallbladder fundus, likely adenomyomatosis. Pancreas: Normal uniform enhancement of the pancreas. No evidence of pancreatic contusion or ductal disruption. No peripancreatic inflammation. Spleen: No direct splenic injury or perisplenic hematoma. Adrenals/Urinary Tract: No adrenal hemorrhage or suspicious adrenal lesions. 4.5 cm cystic appearing lesion in the upper pole right kidney with thin inferior septation, Bosniak II. Few punctate nonobstructing calculi in both kidneys as well as several vascular calcifications. No obstructive urolithiasis or hydronephrosis. Kidneys enhance and excrete symmetrically without extravasation of contrast on excretory phase imaging. No evidence of direct bladder injury. Stomach/Bowel: Distal esophagus, stomach and duodenum are unremarkable. No small bowel dilatation or wall thickening. No colonic dilatation or wall thickening. The appendix is surgically absent. Scattered colonic diverticula without focal pericolonic inflammation to suggest diverticulitis. No mesenteric hematoma or mesenteric contusion is evident. Vascular/Lymphatic: Atherosclerotic plaque within the normal caliber aorta. Fusiform aneurysmal dilatation of the infrarenal abdominal aorta to 4.4 cm in size with eccentric calcified and noncalcified mural plaque. Additional plaque noted in the proximal great vessels including mild narrowing in the proximal splenic artery  and at the IMA origin. Atheromatous disease is seen extending into the proximal inflow and outflow levels. Moderate narrowing at the distal right common iliac at the level of the bifurcation. No periaortic stranding or hemorrhage. No evidence of acute vascular injury or dissection. Retroaortic left renal vein is noted. No suspicious or enlarged lymph nodes in the included lymphatic chains. Reproductive: Mild prostatomegaly. Coarse eccentric benign calcifications. Other: No abdominopelvic free fluid or air. No large abdominal wall dehiscence. Contusive changes noted across the left flank and chest wall. No bowel containing hernias. Musculoskeletal: Multilevel degenerative changes noted in the lower lumbar spine. Acute fracture or traumatic listhesis. Degenerative retrolisthesis L5 on S1 is noted with maximal discogenic changes at this level. Bony pelvis is intact. Proximal femora are intact and well seated within the acetabula. IMPRESSION: Traumatic 1. Posterior left fourth through twelfth minimally displaced rib fractures. Additional anterior left second through sixth rib fractures. Given the extent of fractures including contiguous segmental fractures, close clinical assessment for flail chest morphology is recommended. 2. Small left apical and medial pneumothorax and trace pleural thickening, likely hemothorax. Some dependent atelectasis versus pulmonary contusion noted in bases. 3. Subtle sclerotic band subjacent to the superior endplate cortex at T8 with some mild anterior wedging may reflect a subcortical impaction fracture. No adjacent paraspinal hemorrhage or visible canal hematoma. Nontraumatic 1. 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 2. Coronary artery atherosclerosis. 3. Colonic diverticulosis without evidence of acute diverticulitis. 4. 4.4 cm infrarenal abdominal aortic aneurysm. Reflects interval enlargement since 2017 comparison. Recommend  followup by ultrasound in 1 year.  This recommendation follows ACR consensus guidelines: White Paper of the ACR Incidental Findings Committee II on Vascular Findings. J Am Coll Radiol 2013; 10:789-794. Aortic aneurysm NOS (ICD10-I71.9). 5. Multilevel degenerative changes in the lower lumbar spine with associated grade 1 retrolisthesis L5 on S1. These results were called by telephone at the time of interpretation on 04/09/2019 at 5:46 pm to provider [facility] , who verbally acknowledged these results. Electronically Signed   By: Kreg Shropshire M.D.   On: 04/09/2019 17:48   DG Pelvis Portable  Result Date: 04/09/2019 CLINICAL DATA:  64 year old who fell approximately 17 feet out of a tree. Initial encounter. EXAM: PORTABLE PELVIS 1-2 VIEWS COMPARISON:  None. FINDINGS: No evidence of acute fracture. Hip joints anatomically aligned with well-preserved joint spaces. Sacroiliac joints and symphysis pubis anatomically aligned without diastasis and without degenerative change. Note is made of a distal abdominal aortic aneurysm measuring approximately 5 cm (uncorrected for magnification). IMPRESSION: 1. No acute osseous abnormality. 2. Distal abdominal aortic aneurysm measuring approximately 5 cm (uncorrected for magnification). This will be better evaluated on the CT abdomen and pelvis obtained subsequently. Electronically Signed   By: Hulan Saas M.D.   On: 04/09/2019 17:02   DG Chest Port 1 View  Result Date: 04/09/2019 CLINICAL DATA:  Level 2 trauma. Pt fell 17 ft from a ladder leaning on a tree. EXAM: PORTABLE CHEST 1 VIEW COMPARISON:  Chest radiograph 12/14/2007 FINDINGS: Cardiomediastinal contours within normal limits. The lungs are clear. No pneumothorax or significant pleural effusion. Left lateral rib fracture. IMPRESSION: No acute cardiopulmonary abnormality. Left lateral rib fracture. Electronically Signed   By: Emmaline Kluver M.D.   On: 04/09/2019 17:00    Review of Systems  Constitutional: Negative.   HENT: Negative.   Eyes:  Negative.   Respiratory:       Pain with deep breath  Cardiovascular: Positive for chest pain.  Gastrointestinal: Negative.   Endocrine: Negative.   Genitourinary: Negative.   Musculoskeletal:       Recent left shoulder and right knee surgeries  Allergic/Immunologic: Negative.   Neurological: Negative for speech difficulty and weakness.  Hematological: Negative.   Psychiatric/Behavioral: Negative.     Blood pressure (!) 162/94, pulse 70, temperature 97.9 F (36.6 C), temperature source Temporal, resp. rate 19, height 6\' 2"  (1.88 m), weight 74.8 kg, SpO2 100 %. Physical Exam  Constitutional: He is oriented to person, place, and time. He appears well-developed and well-nourished. No distress.  HENT:  Head: Normocephalic.  Right Ear: External ear normal.  Left Ear: External ear normal.  Mouth/Throat: Oropharynx is clear and moist.  Eyes: Pupils are equal, round, and reactive to light. EOM are normal. Right eye exhibits no discharge. Left eye exhibits no discharge. No scleral icterus.  Neck: No tracheal deviation present. No thyromegaly present.  No posterior midline tenderness  Cardiovascular: Normal rate, regular rhythm, normal heart sounds and intact distal pulses.  No murmur heard. Respiratory: Effort normal and breath sounds normal. No respiratory distress. He has no wheezes. He has no rales. He exhibits tenderness.  Left-sided rib tenderness  GI: Soft. He exhibits no distension and no mass. There is no abdominal tenderness. There is no rebound and no guarding.  No hepatosplenomegaly  Musculoskeletal:     Cervical back: Neck supple.     Comments: No gross deformities, large abrasion posterior left leg  Neurological: He is alert and oriented to person, place, and time. He displays no atrophy and no tremor.  No cranial nerve deficit. He exhibits normal muscle tone. He displays no seizure activity. GCS eye subscore is 4. GCS verbal subscore is 5. GCS motor subscore is 6.  Skin: Skin  is warm and dry. No rash noted.  Psychiatric: He has a normal mood and affect.  Alert and oriented x3     Assessment/Plan Fall from ladder Concussion Left rib fractures 2 through 12 with occult pneumothorax Abrasion posterior left lower extremity Incidental finding of 4.4 cm AAA  Admit to progressive, inpatient.  Multimodal pain control and pulmonary toilet.  Follow-up chest x-ray.  PT/OT.  Liz Malady, MD 04/09/2019, 6:44 PM

## 2019-04-09 NOTE — ED Notes (Signed)
Pt cooling off now

## 2019-04-09 NOTE — ED Notes (Signed)
TRN note:  Pt transported to CT and back on CCM  Azaan Leask RN [phone]

## 2019-04-09 NOTE — ED Notes (Signed)
JAYSON, [place] Spouse   [phone]   Would like a patient update.

## 2019-04-09 NOTE — ED Notes (Signed)
Dr. Thompson at bedside. 

## 2019-04-09 NOTE — ED Notes (Signed)
The pt c/o lt rib pain his rib fractures and  Pneumothorax is on the lt side

## 2019-04-09 NOTE — ED Triage Notes (Signed)
Pt BIB [facility], pt fell 23ft from a tree, no obvious deformities, abrasion to left posterior thigh. +LOC per witnesses, GCS 15 at this time. c-collar placed PTA. C/o pain to left chest. EMS VS: BP 184/96, HR 76, SpO2 95% room air.

## 2019-04-09 NOTE — ED Provider Notes (Addendum)
[facility] EMERGENCY DEPARTMENT Provider Note   CSN: [id number] Arrival date & time: 04/09/19  1634     History Chief Complaint  Patient presents with  . Fall    Dale Perez is a 64 y.o. male.  HPI    64 year old comes in a chief complaint of fall.  Patient has no significant medical history and has had some orthopedic surgeries in the past.  He reports that he was cutting tree limb at his home and had a fall.  His fall was from about 16 to 17 feet height.  Patient is unsure if he lost consciousness.  His wife called EMS.  Patient is complaining primarily of left-sided chest pain that is worse with deep inspiration and also left thigh pain in the back.  He has no numbness, tingling, headache, new numbness or weakness.  Patient is not on any blood thinners.   History reviewed. No pertinent past medical history.  Patient Active Problem List   Diagnosis Date Noted  . Left rib fracture 04/09/2019    Past Surgical History:  Procedure Laterality Date  . KNEE CARTILAGE SURGERY Right 02/2019  . SHOULDER ARTHROSCOPY Left 08/2018       History reviewed. No pertinent family history.  Social History   Tobacco Use  . Smoking status: Current Every Day Smoker    Packs/day: 1.00    Years: 40.00    Pack years: 40.00    Types: Cigarettes  . Smokeless tobacco: Never Used  Substance Use Topics  . Alcohol use: Yes    Alcohol/week: 1.0 standard drinks    Types: 1 Cans of beer per week  . Drug use: Never    Home Medications Prior to Admission medications   Not on File    Allergies    Aspirin and Penicillins  Review of Systems   Review of Systems  Constitutional: Positive for activity change.  Respiratory: Positive for shortness of breath.   Cardiovascular: Positive for chest pain.  Gastrointestinal: Negative for nausea and vomiting.  Allergic/Immunologic: Negative for immunocompromised state.  Neurological: Negative for numbness.  Hematological:  Does not bruise/bleed easily.  All other systems reviewed and are negative.   Physical Exam Updated Vital Signs BP 124/79 (BP Location: Right Arm)   Pulse 65   Temp 98.2 F (36.8 C) (Oral)   Resp 15   Ht 6\' 2"  (1.88 m)   Wt 74.8 kg   SpO2 95%   BMI 21.18 kg/m   Physical Exam Vitals and nursing note reviewed.  Constitutional:      Appearance: He is well-developed.  HENT:     Head: Atraumatic.  Eyes:     Extraocular Movements: Extraocular movements intact.     Pupils: Pupils are equal, round, and reactive to light.  Cardiovascular:     Rate and Rhythm: Normal rate.  Pulmonary:     Effort: Pulmonary effort is normal. No respiratory distress.  Musculoskeletal:     Cervical back: Neck supple.     Right lower leg: No edema.     Left lower leg: No edema.  Skin:    General: Skin is warm.     Findings: Bruising present.  Neurological:     Mental Status: He is alert and oriented to person, place, and time.     ED Results / Procedures / Treatments   Labs (all labs ordered are listed, but only abnormal results are displayed) Labs Reviewed  COMPREHENSIVE METABOLIC PANEL - Abnormal; Notable for the following components:  Result Value   Glucose, Bld 135 (*)    All other components within normal limits  CBC - Abnormal; Notable for the following components:   WBC 10.6 (*)    All other components within normal limits  LACTIC ACID, PLASMA - Abnormal; Notable for the following components:   Lactic Acid, Venous 2.1 (*)    All other components within normal limits  I-STAT CHEM 8, ED - Abnormal; Notable for the following components:   Glucose, Bld 132 (*)    Calcium, Ion 1.08 (*)    All other components within normal limits  RESPIRATORY PANEL BY RT PCR (FLU A&B, COVID)  ETHANOL  PROTIME-INR  URINALYSIS, ROUTINE W REFLEX MICROSCOPIC  HIV ANTIBODY (ROUTINE TESTING W REFLEX)  CBC  BASIC METABOLIC PANEL  SAMPLE TO BLOOD BANK    EKG None  Radiology CT HEAD WO  CONTRAST  Result Date: 04/09/2019 CLINICAL DATA:  Head trauma, headache, fell 17 feet from a tree. No obvious deformities. Abrasion to left posterior thigh, positive loss of consciousness, GCS 15 EXAM: CT HEAD WITHOUT CONTRAST CT CERVICAL SPINE WITHOUT CONTRAST TECHNIQUE: Multidetector CT imaging of the head and cervical spine was performed following the standard protocol without intravenous contrast. Multiplanar CT image reconstructions of the cervical spine were also generated. COMPARISON:  CT 08/07/2017 FINDINGS: CT HEAD FINDINGS Brain: No evidence of acute infarction, hemorrhage, hydrocephalus, extra-axial collection or mass lesion/mass effect. Symmetric prominence of the ventricles, cisterns and sulci compatible with parenchymal volume loss. Patchy areas of white matter hypoattenuation are most compatible with chronic microvascular angiopathy. Vascular: Atherosclerotic calcification of the carotid siphons. No hyperdense vessel. Skull: No calvarial fracture or scalp swelling. Mild soft tissue thickening superficial to the left zygoma. Most compatible with a small dermal inclusion cyst. Few benign scalp calcifications are present. Sinuses/Orbits: Minimal mucosal thickening in the ethmoid sinuses. Remaining paranasal sinuses are predominantly clear. Hypo pneumatization of the mastoids with a left mastoid effusion and hyperostotic features similar to prior and suggesting chronicity. Soft tissue attenuation attic of the left middle ear cavity with destruction of the left ossicular chain most suspicious for a left cholesteatoma. Other: Small amount of debris in the external auditory canal on the right. CT CERVICAL SPINE FINDINGS Alignment: Cervical stabilization collar is in place at the time of examination. Preservation of the normal cervical lordosis without traumatic listhesis. No abnormally widened, perched or jumped facets. Normal alignment of the craniocervical and atlantoaxial articulations. Skull base and  vertebrae: No acute fracture. No primary bone lesion or focal pathologic process. Soft tissues and spinal canal: No pre or paravertebral fluid or swelling. No visible canal hematoma. Disc levels: Multilevel intervertebral disc height loss with spondylitic endplate changes. Features are most pronounced C4-C7 where disc osteophyte complexes efface the ventral thecal sac without significant canal stenosis. Mild uncinate spurring and facet arthropathic changes at these levels do result in mild bilateral neural foramina narrowing. Upper chest: No acute abnormality in the upper chest or imaged lung apices. Other: Normal thyroid. Question postsurgical changes along the posterior left thyroid lobe. Cervical carotid atherosclerosis is noted. IMPRESSION: 1. No CT evidence of acute intracranial process. No acute scalp swelling or calvarial fracture. 2. Mild parenchymal volume loss and chronic microvascular angiopathy. 3. Chronic left-sided mastoiditis. 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 4. No acute fracture or traumatic listhesis of the cervical spine. 5. Mild multilevel intervertebral spondylitic changes, most pronounced C4-C7.  Detailed above. Electronically Signed   By: Kreg Shropshire M.D.   On: 04/09/2019 17:26   CT Chest W Contrast  Result Date: 04/09/2019 CLINICAL DATA:  Lower back pain, trauma, fall approximately 17 foot from tree PE stating is EXAM: CT CHEST, ABDOMEN, AND PELVIS WITH CONTRAST TECHNIQUE: Multidetector CT imaging of the chest, abdomen and pelvis was performed following the standard protocol during bolus administration of intravenous contrast. CONTRAST:  OMNIPAQUE IOHEXOL 300 MG/ML  SOLN COMPARISON:  CT abdomen pelvis 07/24/2015 FINDINGS: CT CHEST FINDINGS Cardiovascular: The aorta is normal caliber. No intramural hematoma, dissection flap or other acute luminal  abnormality of the aorta is seen. No periaortic stranding or hemorrhage. Atheromatous plaque seen throughout the thoracic aorta and within the normally branching proximal great vessels including some mild proximal atheromatous narrowing of the left common carotid and subclavian arteries and right vertebral artery. Normal heart size. No pericardial effusion. Coronary artery calcifications are present central pulmonary arteries are normal caliber. No large central filling defects on this non tailored examination of the pulmonary arteries. Mediastinum/Nodes: No mediastinal fluid or gas. Normal thyroid gland and thoracic inlet. No acute abnormality of the trachea or esophagus. No worrisome mediastinal, hilar or axillary adenopathy. Lungs/Pleura: Small left apical and medial pneumothorax and trace pleural thickening, likely hemothorax. Dependent ground-glass towards the lung bases most likely reflects atelectasis though some underlying pulmonary contusion could have a similar appearance. No other acute traumatic findings of the lung parenchyma. Musculoskeletal: Posterior left fourth through twelfth minimally displaced rib fractures. Additional anterior left second through sixth rib fractures. Findings result in segmental fractures of 4th to 6th ribs. No visible right rib fractures. Scapula appear intact. No acute traumatic abnormality of the imaged shoulder girdles. No sternal fracture is seen. Complete fusion across the sternomanubrial joint is noted incidentally. Subtle sclerotic band subjacent to the superior endplate cortex at T8 with some mild anterior wedging may reflect a subcortical impaction fracture. No adjacent paraspinal hemorrhage or visible canal hematoma. CT ABDOMEN PELVIS FINDINGS Hepatobiliary: No direct hepatic injury or perihepatic hematoma. Small subcentimeter hypoattenuating foci in the anterior left lobe and posterior right lobe liver too small to fully characterize on CT imaging but statistically  likely benign. No focal liver abnormality is seen. No gallstones, gallbladder wall thickening, or biliary dilatation. Mild low-attenuation nodule at the tip of the gallbladder fundus, likely adenomyomatosis. Pancreas: Normal uniform enhancement of the pancreas. No evidence of pancreatic contusion or ductal disruption. No peripancreatic inflammation. Spleen: No direct splenic injury or perisplenic hematoma. Adrenals/Urinary Tract: No adrenal hemorrhage or suspicious adrenal lesions. 4.5 cm cystic appearing lesion in the upper pole right kidney with thin inferior septation, Bosniak II. Few punctate nonobstructing calculi in both kidneys as well as several vascular calcifications. No obstructive urolithiasis or hydronephrosis. Kidneys enhance and excrete symmetrically without extravasation of contrast on excretory phase imaging. No evidence of direct bladder injury. Stomach/Bowel: Distal esophagus, stomach and duodenum are unremarkable. No small bowel dilatation or wall thickening. No colonic dilatation or wall thickening. The appendix is surgically absent. Scattered colonic diverticula without focal pericolonic inflammation to suggest diverticulitis. No mesenteric hematoma or mesenteric contusion is evident. Vascular/Lymphatic: Atherosclerotic plaque within the normal caliber aorta. Fusiform aneurysmal dilatation of the infrarenal abdominal aorta to 4.4 cm in size with eccentric calcified and noncalcified mural plaque. Additional plaque noted in the proximal great vessels including mild narrowing in the proximal splenic artery and at the IMA origin. Atheromatous disease is seen extending into the proximal inflow and outflow levels. Moderate  narrowing at the distal right common iliac at the level of the bifurcation. No periaortic stranding or hemorrhage. No evidence of acute vascular injury or dissection. Retroaortic left renal vein is noted. No suspicious or enlarged lymph nodes in the included lymphatic chains.  Reproductive: Mild prostatomegaly. Coarse eccentric benign calcifications. Other: No abdominopelvic free fluid or air. No large abdominal wall dehiscence. Contusive changes noted across the left flank and chest wall. No bowel containing hernias. Musculoskeletal: Multilevel degenerative changes noted in the lower lumbar spine. Acute fracture or traumatic listhesis. Degenerative retrolisthesis L5 on S1 is noted with maximal discogenic changes at this level. Bony pelvis is intact. Proximal femora are intact and well seated within the acetabula. IMPRESSION: Traumatic 1. Posterior left fourth through twelfth minimally displaced rib fractures. Additional anterior left second through sixth rib fractures. Given the extent of fractures including contiguous segmental fractures, close clinical assessment for flail chest morphology is recommended. 2. Small left apical and medial pneumothorax and trace pleural thickening, likely hemothorax. Some dependent atelectasis versus pulmonary contusion noted in bases. 3. Subtle sclerotic band subjacent to the superior endplate cortex at T8 with some mild anterior wedging may reflect a subcortical impaction fracture. No adjacent paraspinal hemorrhage or visible canal hematoma. Nontraumatic 1. 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 2. Coronary artery atherosclerosis. 3. Colonic diverticulosis without evidence of acute diverticulitis. 4. 4.4 cm infrarenal abdominal aortic aneurysm. Reflects interval enlargement since 2017 comparison. Recommend followup by ultrasound in 1 year. This recommendation follows ACR consensus guidelines: White Paper of the ACR Incidental Findings Committee II on Vascular Findings. J Am Coll Radiol 2013; 10:789-794. Aortic aneurysm NOS (ICD10-I71.9). 5. Multilevel degenerative changes in the lower lumbar spine with associated grade 1 retrolisthesis L5 on S1. These results were called by telephone at the time of interpretation on 04/09/2019  at 5:46 pm to provider [facility] , who verbally acknowledged these results. Electronically Signed   By: Kreg ShropshirePrice  DeHay M.D.   On: 04/09/2019 17:48   CT CERVICAL SPINE WO CONTRAST  Result Date: 04/09/2019 CLINICAL DATA:  Head trauma, headache, fell 17 feet from a tree. No obvious deformities. Abrasion to left posterior thigh, positive loss of consciousness, GCS 15 EXAM: CT HEAD WITHOUT CONTRAST CT CERVICAL SPINE WITHOUT CONTRAST TECHNIQUE: Multidetector CT imaging of the head and cervical spine was performed following the standard protocol without intravenous contrast. Multiplanar CT image reconstructions of the cervical spine were also generated. COMPARISON:  CT 08/07/2017 FINDINGS: CT HEAD FINDINGS Brain: No evidence of acute infarction, hemorrhage, hydrocephalus, extra-axial collection or mass lesion/mass effect. Symmetric prominence of the ventricles, cisterns and sulci compatible with parenchymal volume loss. Patchy areas of white matter hypoattenuation are most compatible with chronic microvascular angiopathy. Vascular: Atherosclerotic calcification of the carotid siphons. No hyperdense vessel. Skull: No calvarial fracture or scalp swelling. Mild soft tissue thickening superficial to the left zygoma. Most compatible with a small dermal inclusion cyst. Few benign scalp calcifications are present. Sinuses/Orbits: Minimal mucosal thickening in the ethmoid sinuses. Remaining paranasal sinuses are predominantly clear. Hypo pneumatization of the mastoids with a left mastoid effusion and hyperostotic features similar to prior and suggesting chronicity. Soft tissue attenuation attic of the left middle ear cavity with destruction of the left ossicular chain most suspicious for a left cholesteatoma. Other: Small amount of debris in the external auditory canal on the right. CT CERVICAL SPINE FINDINGS Alignment: Cervical stabilization collar is in place at the time of examination. Preservation of the normal cervical  lordosis without traumatic listhesis. No abnormally widened, perched or jumped facets. Normal alignment of the craniocervical and atlantoaxial articulations. Skull base and vertebrae: No acute fracture. No primary bone lesion or focal pathologic process. Soft tissues and spinal canal: No pre or paravertebral fluid or swelling. No visible canal hematoma. Disc levels: Multilevel intervertebral disc height loss with spondylitic endplate changes. Features are most pronounced C4-C7 where disc osteophyte complexes efface the ventral thecal sac without significant canal stenosis. Mild uncinate spurring and facet arthropathic changes at these levels do result in mild bilateral neural foramina narrowing. Upper chest: No acute abnormality in the upper chest or imaged lung apices. Other: Normal thyroid. Question postsurgical changes along the posterior left thyroid lobe. Cervical carotid atherosclerosis is noted. IMPRESSION: 1. No CT evidence of acute intracranial process. No acute scalp swelling or calvarial fracture. 2. Mild parenchymal volume loss and chronic microvascular angiopathy. 3. Chronic left-sided mastoiditis. 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 4. No acute fracture or traumatic listhesis of the cervical spine. 5. Mild multilevel intervertebral spondylitic changes, most pronounced C4-C7. Detailed above. Electronically Signed   By: Kreg Shropshire M.D.   On: 04/09/2019 17:26   CT ABDOMEN PELVIS W CONTRAST  Result Date: 04/09/2019 CLINICAL DATA:  Lower back pain, trauma, fall approximately 17 foot from tree PE stating is EXAM: CT CHEST, ABDOMEN, AND PELVIS WITH CONTRAST TECHNIQUE: Multidetector CT imaging of the chest, abdomen and pelvis was performed following the standard protocol during bolus administration of intravenous contrast. CONTRAST:  OMNIPAQUE IOHEXOL 300 MG/ML  SOLN  COMPARISON:  CT abdomen pelvis 07/24/2015 FINDINGS: CT CHEST FINDINGS Cardiovascular: The aorta is normal caliber. No intramural hematoma, dissection flap or other acute luminal abnormality of the aorta is seen. No periaortic stranding or hemorrhage. Atheromatous plaque seen throughout the thoracic aorta and within the normally branching proximal great vessels including some mild proximal atheromatous narrowing of the left common carotid and subclavian arteries and right vertebral artery. Normal heart size. No pericardial effusion. Coronary artery calcifications are present central pulmonary arteries are normal caliber. No large central filling defects on this non tailored examination of the pulmonary arteries. Mediastinum/Nodes: No mediastinal fluid or gas. Normal thyroid gland and thoracic inlet. No acute abnormality of the trachea or esophagus. No worrisome mediastinal, hilar or axillary adenopathy. Lungs/Pleura: Small left apical and medial pneumothorax and trace pleural thickening, likely hemothorax. Dependent ground-glass towards the lung bases most likely reflects atelectasis though some underlying pulmonary contusion could have a similar appearance. No other acute traumatic findings of the lung parenchyma. Musculoskeletal: Posterior left fourth through twelfth minimally displaced rib fractures. Additional anterior left second through sixth rib fractures. Findings result in segmental fractures of 4th to 6th ribs. No visible right rib fractures. Scapula appear intact. No acute traumatic abnormality of the imaged shoulder girdles. No sternal fracture is seen. Complete fusion across the sternomanubrial joint is noted incidentally. Subtle sclerotic band subjacent to the superior endplate cortex at T8 with some mild anterior wedging may reflect a subcortical impaction fracture. No adjacent paraspinal hemorrhage or visible canal hematoma. CT ABDOMEN PELVIS FINDINGS Hepatobiliary: No direct hepatic injury or  perihepatic hematoma. Small subcentimeter hypoattenuating foci in the anterior left lobe and posterior right lobe liver too small to fully characterize on CT imaging but statistically likely benign. No focal liver abnormality is seen. No gallstones, gallbladder wall thickening, or biliary dilatation. Mild low-attenuation nodule at the  tip of the gallbladder fundus, likely adenomyomatosis. Pancreas: Normal uniform enhancement of the pancreas. No evidence of pancreatic contusion or ductal disruption. No peripancreatic inflammation. Spleen: No direct splenic injury or perisplenic hematoma. Adrenals/Urinary Tract: No adrenal hemorrhage or suspicious adrenal lesions. 4.5 cm cystic appearing lesion in the upper pole right kidney with thin inferior septation, Bosniak II. Few punctate nonobstructing calculi in both kidneys as well as several vascular calcifications. No obstructive urolithiasis or hydronephrosis. Kidneys enhance and excrete symmetrically without extravasation of contrast on excretory phase imaging. No evidence of direct bladder injury. Stomach/Bowel: Distal esophagus, stomach and duodenum are unremarkable. No small bowel dilatation or wall thickening. No colonic dilatation or wall thickening. The appendix is surgically absent. Scattered colonic diverticula without focal pericolonic inflammation to suggest diverticulitis. No mesenteric hematoma or mesenteric contusion is evident. Vascular/Lymphatic: Atherosclerotic plaque within the normal caliber aorta. Fusiform aneurysmal dilatation of the infrarenal abdominal aorta to 4.4 cm in size with eccentric calcified and noncalcified mural plaque. Additional plaque noted in the proximal great vessels including mild narrowing in the proximal splenic artery and at the IMA origin. Atheromatous disease is seen extending into the proximal inflow and outflow levels. Moderate narrowing at the distal right common iliac at the level of the bifurcation. No periaortic  stranding or hemorrhage. No evidence of acute vascular injury or dissection. Retroaortic left renal vein is noted. No suspicious or enlarged lymph nodes in the included lymphatic chains. Reproductive: Mild prostatomegaly. Coarse eccentric benign calcifications. Other: No abdominopelvic free fluid or air. No large abdominal wall dehiscence. Contusive changes noted across the left flank and chest wall. No bowel containing hernias. Musculoskeletal: Multilevel degenerative changes noted in the lower lumbar spine. Acute fracture or traumatic listhesis. Degenerative retrolisthesis L5 on S1 is noted with maximal discogenic changes at this level. Bony pelvis is intact. Proximal femora are intact and well seated within the acetabula. IMPRESSION: Traumatic 1. Posterior left fourth through twelfth minimally displaced rib fractures. Additional anterior left second through sixth rib fractures. Given the extent of fractures including contiguous segmental fractures, close clinical assessment for flail chest morphology is recommended. 2. Small left apical and medial pneumothorax and trace pleural thickening, likely hemothorax. Some dependent atelectasis versus pulmonary contusion noted in bases. 3. Subtle sclerotic band subjacent to the superior endplate cortex at T8 with some mild anterior wedging may reflect a subcortical impaction fracture. No adjacent paraspinal hemorrhage or visible canal hematoma. Nontraumatic 1. 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 2. Coronary artery atherosclerosis. 3. Colonic diverticulosis without evidence of acute diverticulitis. 4. 4.4 cm infrarenal abdominal aortic aneurysm. Reflects interval enlargement since 2017 comparison. Recommend followup by ultrasound in 1 year. This recommendation follows ACR consensus guidelines: White Paper of the ACR Incidental Findings Committee II on Vascular Findings. J Am Coll Radiol 2013; 10:789-794. Aortic aneurysm NOS (ICD10-I71.9). 5.  Multilevel degenerative changes in the lower lumbar spine with associated grade 1 retrolisthesis L5 on S1. These results were called by telephone at the time of interpretation on 04/09/2019 at 5:46 pm to provider [facility] , who verbally acknowledged these results. Electronically Signed   By: Lovena Le M.D.   On: 04/09/2019 17:48   DG Pelvis Portable  Result Date: 04/09/2019 CLINICAL DATA:  64 year old who fell approximately 17 feet out of a tree. Initial encounter. EXAM: PORTABLE PELVIS 1-2 VIEWS COMPARISON:  None. FINDINGS: No evidence of acute fracture. Hip joints anatomically aligned with well-preserved joint spaces. Sacroiliac joints and symphysis pubis anatomically aligned without  diastasis and without degenerative change. Note is made of a distal abdominal aortic aneurysm measuring approximately 5 cm (uncorrected for magnification). IMPRESSION: 1. No acute osseous abnormality. 2. Distal abdominal aortic aneurysm measuring approximately 5 cm (uncorrected for magnification). This will be better evaluated on the CT abdomen and pelvis obtained subsequently. Electronically Signed   By: Hulan Saashomas  Lawrence M.D.   On: 04/09/2019 17:02   DG Chest Port 1 View  Result Date: 04/09/2019 CLINICAL DATA:  Level 2 trauma. Pt fell 17 ft from a ladder leaning on a tree. EXAM: PORTABLE CHEST 1 VIEW COMPARISON:  Chest radiograph 12/14/2007 FINDINGS: Cardiomediastinal contours within normal limits. The lungs are clear. No pneumothorax or significant pleural effusion. Left lateral rib fracture. IMPRESSION: No acute cardiopulmonary abnormality. Left lateral rib fracture. Electronically Signed   By: Emmaline KluverNancy  Ballantyne M.D.   On: 04/09/2019 17:00    Procedures .Critical Care Performed by: Derwood KaplanNanavati, Katelan Hirt, MD Authorized by: Derwood KaplanNanavati, Placido Hangartner, MD   Critical care provider statement:    Critical care time (minutes):  48   Critical care was necessary to treat or prevent imminent or life-threatening deterioration of the  following conditions:  Trauma   Critical care was time spent personally by me on the following activities:  Discussions with consultants, evaluation of patient's response to treatment, examination of patient, ordering and performing treatments and interventions, ordering and review of laboratory studies, ordering and review of radiographic studies, pulse oximetry, re-evaluation of patient's condition, obtaining history from patient or surrogate and review of old charts   (including critical care time)  Medications Ordered in ED Medications  enoxaparin (LOVENOX) injection 40 mg (has no administration in time range)  0.9 % NaCl with KCl 20 mEq/ L  infusion ( Intravenous New Bag/Given 04/09/19 2156)  oxyCODONE (Oxy IR/ROXICODONE) immediate release tablet 5 mg (has no administration in time range)  oxyCODONE (Oxy IR/ROXICODONE) immediate release tablet 10 mg (has no administration in time range)  HYDROmorphone (DILAUDID) injection 1 mg (has no administration in time range)  ondansetron (ZOFRAN-ODT) disintegrating tablet 4 mg (has no administration in time range)    Or  ondansetron (ZOFRAN) injection 4 mg (has no administration in time range)  methocarbamol (ROBAXIN) 1,000 mg in dextrose 5 % 100 mL IVPB (has no administration in time range)  gabapentin (NEURONTIN) capsule 100 mg (100 mg Oral Given 04/09/19 2153)  acetaminophen (TYLENOL) tablet 650 mg (650 mg Oral Given 04/10/19 0012)  ipratropium-albuterol (DUONEB) 0.5-2.5 (3) MG/3ML nebulizer solution 3 mL (has no administration in time range)  metoprolol tartrate (LOPRESSOR) injection 5 mg (has no administration in time range)  fentaNYL (SUBLIMAZE) injection (75 mcg Intravenous Given 04/09/19 1646)  iohexol (OMNIPAQUE) 300 MG/ML solution 100 mL (100 mLs Intravenous Contrast Given 04/09/19 1700)  fentaNYL (SUBLIMAZE) injection 75 mcg (75 mcg Intravenous Given 04/09/19 2244)  methocarbamol (ROBAXIN) 1,000 mg in dextrose 5 % 100 mL IVPB (0 mg Intravenous  Stopped 04/09/19 2004)    ED Course  I have reviewed the triage vital signs and the nursing notes.  Pertinent labs & imaging results that were available during my care of the patient were reviewed by me and considered in my medical decision making (see chart for details).  Clinical Course as of Apr 10 38  Sat Apr 09, 2019  1813 Patient CT scan reveals multiple rib fractures, tiny apical pneumothorax, pulmonary contusion.  We have placed patient on nonrebreather.  Results of the ED work-up discussed with him.  He also has incidental finding of AAA which was  shared with him.  Dr. Janee Morn, trauma team will be admitting.  He will consult vascular surgery.   [AN]  1814 CT ABDOMEN PELVIS W CONTRAST [KP]    Clinical Course User Index [AN] Derwood Kaplan, MD [KP] Place, Adonis Huguenin   MDM Rules/Calculators/A&P                      64 year old male comes in a chief complaint of fall.  Patient fell from about 16 feet height and is complaining of left-sided chest pain, shortness of breath and left thigh pain.  On exam he is noted to have bilateral breath sounds, soft abdomen and patient has a GCS of 15.  Hemodynamically he is stable.  Concerns for pneumothorax, multiple rib fractures, liver, kidney or spleen laceration.  TBI, subdural and cervical spine injury also possible.  Appropriate radiographs ordered and we will reassess.  Final Clinical Impression(s) / ED Diagnoses Final diagnoses:  Trauma  AAA (abdominal aortic aneurysm) without rupture (HCC)  Closed fracture of multiple ribs of left side, initial encounter  Contusion of left lung, initial encounter  Hemothorax    Rx / DC Orders ED Discharge Orders    None       Derwood Kaplan, MD 04/09/19 1712    Derwood Kaplan, MD 04/09/19 1814    Derwood Kaplan, MD 04/10/19 0040

## 2019-04-09 NOTE — Progress Notes (Signed)
Orthopedic Tech Progress Note Patient Details:  Dale Perez [birth year] [id number]  Patient ID: [name], male   DOB: 10/16/1955, 64 y.o.   MRN: [id number]   Saul Fordyce 04/09/2019, 5:11 PMLEVEL 2 TRAUMA ALERT

## 2019-04-10 ENCOUNTER — Other Ambulatory Visit: Payer: Self-pay

## 2019-04-10 ENCOUNTER — Inpatient Hospital Stay (HOSPITAL_COMMUNITY): Payer: BC Managed Care – PPO

## 2019-04-10 DIAGNOSIS — J939 Pneumothorax, unspecified: Secondary | ICD-10-CM | POA: Diagnosis not present

## 2019-04-10 DIAGNOSIS — S060X9A Concussion with loss of consciousness of unspecified duration, initial encounter: Secondary | ICD-10-CM | POA: Diagnosis not present

## 2019-04-10 DIAGNOSIS — S270XXA Traumatic pneumothorax, initial encounter: Secondary | ICD-10-CM | POA: Diagnosis not present

## 2019-04-10 DIAGNOSIS — S2242XA Multiple fractures of ribs, left side, initial encounter for closed fracture: Secondary | ICD-10-CM | POA: Diagnosis not present

## 2019-04-10 LAB — CBC
HCT: 43.2 % (ref 39.0–52.0)
Hemoglobin: 13.7 g/dL (ref 13.0–17.0)
MCH: 28.8 pg (ref 26.0–34.0)
MCHC: 31.7 g/dL (ref 30.0–36.0)
MCV: 90.8 fL (ref 80.0–100.0)
Platelets: 231 10*3/uL (ref 150–400)
RBC: 4.76 MIL/uL (ref 4.22–5.81)
RDW: 14.4 % (ref 11.5–15.5)
WBC: 8.7 10*3/uL (ref 4.0–10.5)
nRBC: 0 % (ref 0.0–0.2)

## 2019-04-10 LAB — SAMPLE TO BLOOD BANK

## 2019-04-10 LAB — BASIC METABOLIC PANEL
Anion gap: 9 (ref 5–15)
BUN: 16 mg/dL (ref 8–23)
CO2: 25 mmol/L (ref 22–32)
Calcium: 8.5 mg/dL — ABNORMAL LOW (ref 8.9–10.3)
Chloride: 105 mmol/L (ref 98–111)
Creatinine, Ser: 0.84 mg/dL (ref 0.61–1.24)
GFR calc Af Amer: >60 mL/min (ref >60)
GFR calc non Af Amer: >60 mL/min (ref >60)
Glucose, Bld: 100 mg/dL — ABNORMAL HIGH (ref 70–99)
Potassium: 4.3 mmol/L (ref 3.5–5.1)
Sodium: 139 mmol/L (ref 135–145)

## 2019-04-10 LAB — HIV ANTIBODY (ROUTINE TESTING W REFLEX): HIV Screen 4th Generation wRfx: NONREACTIVE

## 2019-04-10 MED ORDER — IBUPROFEN 200 MG PO TABS
600.0000 mg | ORAL_TABLET | Freq: Once | ORAL | Status: AC
  Administered 2019-04-10: 600 mg via ORAL
  Filled 2019-04-10: qty 3

## 2019-04-10 NOTE — Progress Notes (Signed)
Subjective/Chief Complaint: Pt with pain on movement Working on IS   Objective: Vital signs in last 24 hours: Temp:  [97.9 F (36.6 C)-98.5 F (36.9 C)] 98.1 F (36.7 C) (03/07 0741) Pulse Rate:  [64-80] 66 (03/07 0741) Resp:  [12-26] 12 (03/07 0741) BP: (110-175)/(77-120) 110/77 (03/07 0741) SpO2:  [92 %-100 %] 94 % (03/07 0741) Weight:  [74.8 kg] 74.8 kg (03/06 1647) Last BM Date: 04/09/19  Intake/Output from previous day: 03/06 0701 - 03/07 0700 In: 648.1 [P.O.:340; I.V.:308.1] Out: 400 [Urine:400] Intake/Output this shift: No intake/output data recorded.  PE: Constitutional: No acute distress, conversant, appears states age. Eyes: Anicteric sclerae, moist conjunctiva, no lid lag Lungs:Coarse BS bilaterally, normal respiratory effort CV: regular rate and rhythm, no murmurs, no peripheral edema, pedal pulses 2+ GI: Soft, no masses or hepatosplenomegaly, non-tender to palpation Skin: No rashes, palpation reveals normal turgor Psychiatric: appropriate judgment and insight, oriented to person, place, and time   Lab Results:  Recent Labs    04/09/19 1645 04/09/19 1645 04/09/19 1647 04/10/19 0458  WBC 10.6*  --   --  8.7  HGB 15.2   < > 16.0 13.7  HCT 49.3   < > 47.0 43.2  PLT 257  --   --  231   < > = values in this interval not displayed.   BMET Recent Labs    04/09/19 1645 04/09/19 1645 04/09/19 1647 04/10/19 0458  NA 139   < > 139 139  K 4.6   < > 4.4 4.3  CL 105   < > 104 105  CO2 22  --   --  25  GLUCOSE 135*   < > 132* 100*  BUN 20   < > 22 16  CREATININE 0.97   < > 0.90 0.84  CALCIUM 9.5  --   --  8.5*   < > = values in this interval not displayed.   PT/INR Recent Labs    04/09/19 1645  LABPROT 12.5  INR 0.9   ABG No results for input(s): PHART, HCO3 in the last 72 hours.  Invalid input(s): PCO2, PO2  Studies/Results: CT HEAD WO CONTRAST  Result Date: 04/09/2019 CLINICAL DATA:  Head trauma, headache, fell 17 feet from a tree.  No obvious deformities. Abrasion to left posterior thigh, positive loss of consciousness, GCS 15 EXAM: CT HEAD WITHOUT CONTRAST CT CERVICAL SPINE WITHOUT CONTRAST TECHNIQUE: Multidetector CT imaging of the head and cervical spine was performed following the standard protocol without intravenous contrast. Multiplanar CT image reconstructions of the cervical spine were also generated. COMPARISON:  CT 08/07/2017 FINDINGS: CT HEAD FINDINGS Brain: No evidence of acute infarction, hemorrhage, hydrocephalus, extra-axial collection or mass lesion/mass effect. Symmetric prominence of the ventricles, cisterns and sulci compatible with parenchymal volume loss. Patchy areas of white matter hypoattenuation are most compatible with chronic microvascular angiopathy. Vascular: Atherosclerotic calcification of the carotid siphons. No hyperdense vessel. Skull: No calvarial fracture or scalp swelling. Mild soft tissue thickening superficial to the left zygoma. Most compatible with a small dermal inclusion cyst. Few benign scalp calcifications are present. Sinuses/Orbits: Minimal mucosal thickening in the ethmoid sinuses. Remaining paranasal sinuses are predominantly clear. Hypo pneumatization of the mastoids with a left mastoid effusion and hyperostotic features similar to prior and suggesting chronicity. Soft tissue attenuation attic of the left middle ear cavity with destruction of the left ossicular chain most suspicious for a left cholesteatoma. Other: Small amount of debris in the external auditory canal on the right. CT  CERVICAL SPINE FINDINGS Alignment: Cervical stabilization collar is in place at the time of examination. Preservation of the normal cervical lordosis without traumatic listhesis. No abnormally widened, perched or jumped facets. Normal alignment of the craniocervical and atlantoaxial articulations. Skull base and vertebrae: No acute fracture. No primary bone lesion or focal pathologic process. Soft tissues and  spinal canal: No pre or paravertebral fluid or swelling. No visible canal hematoma. Disc levels: Multilevel intervertebral disc height loss with spondylitic endplate changes. Features are most pronounced C4-C7 where disc osteophyte complexes efface the ventral thecal sac without significant canal stenosis. Mild uncinate spurring and facet arthropathic changes at these levels do result in mild bilateral neural foramina narrowing. Upper chest: No acute abnormality in the upper chest or imaged lung apices. Other: Normal thyroid. Question postsurgical changes along the posterior left thyroid lobe. Cervical carotid atherosclerosis is noted. IMPRESSION: 1. No CT evidence of acute intracranial process. No acute scalp swelling or calvarial fracture. 2. Mild parenchymal volume loss and chronic microvascular angiopathy. 3. Chronic left-sided mastoiditis. 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 4. No acute fracture or traumatic listhesis of the cervical spine. 5. Mild multilevel intervertebral spondylitic changes, most pronounced C4-C7. Detailed above. Electronically Signed   By: Lovena Le M.D.   On: 04/09/2019 17:26   CT Chest W Contrast  Result Date: 04/09/2019 CLINICAL DATA:  Lower back pain, trauma, fall approximately 17 foot from tree PE stating is EXAM: CT CHEST, ABDOMEN, AND PELVIS WITH CONTRAST TECHNIQUE: Multidetector CT imaging of the chest, abdomen and pelvis was performed following the standard protocol during bolus administration of intravenous contrast. CONTRAST:  173mL OMNIPAQUE IOHEXOL 300 MG/ML  SOLN COMPARISON:  CT abdomen pelvis 07/24/2015 FINDINGS: CT CHEST FINDINGS Cardiovascular: The aorta is normal caliber. No intramural hematoma, dissection flap or other acute luminal abnormality of the aorta is seen. No periaortic stranding or hemorrhage. Atheromatous plaque seen throughout  the thoracic aorta and within the normally branching proximal great vessels including some mild proximal atheromatous narrowing of the left common carotid and subclavian arteries and right vertebral artery. Normal heart size. No pericardial effusion. Coronary artery calcifications are present central pulmonary arteries are normal caliber. No large central filling defects on this non tailored examination of the pulmonary arteries. Mediastinum/Nodes: No mediastinal fluid or gas. Normal thyroid gland and thoracic inlet. No acute abnormality of the trachea or esophagus. No worrisome mediastinal, hilar or axillary adenopathy. Lungs/Pleura: Small left apical and medial pneumothorax and trace pleural thickening, likely hemothorax. Dependent ground-glass towards the lung bases most likely reflects atelectasis though some underlying pulmonary contusion could have a similar appearance. No other acute traumatic findings of the lung parenchyma. Musculoskeletal: Posterior left fourth through twelfth minimally displaced rib fractures. Additional anterior left second through sixth rib fractures. Findings result in segmental fractures of 4th to 6th ribs. No visible right rib fractures. Scapula appear intact. No acute traumatic abnormality of the imaged shoulder girdles. No sternal fracture is seen. Complete fusion across the sternomanubrial joint is noted incidentally. Subtle sclerotic band subjacent to the superior endplate cortex at T8 with some mild anterior wedging may reflect a subcortical impaction fracture. No adjacent paraspinal hemorrhage or visible canal hematoma. CT ABDOMEN PELVIS FINDINGS Hepatobiliary: No direct hepatic injury or perihepatic hematoma. Small subcentimeter hypoattenuating foci in the anterior left lobe and posterior right lobe liver too small to fully characterize on CT imaging but statistically likely benign.  No focal liver abnormality is seen. No gallstones, gallbladder wall thickening, or biliary  dilatation. Mild low-attenuation nodule at the tip of the gallbladder fundus, likely adenomyomatosis. Pancreas: Normal uniform enhancement of the pancreas. No evidence of pancreatic contusion or ductal disruption. No peripancreatic inflammation. Spleen: No direct splenic injury or perisplenic hematoma. Adrenals/Urinary Tract: No adrenal hemorrhage or suspicious adrenal lesions. 4.5 cm cystic appearing lesion in the upper pole right kidney with thin inferior septation, Bosniak II. Few punctate nonobstructing calculi in both kidneys as well as several vascular calcifications. No obstructive urolithiasis or hydronephrosis. Kidneys enhance and excrete symmetrically without extravasation of contrast on excretory phase imaging. No evidence of direct bladder injury. Stomach/Bowel: Distal esophagus, stomach and duodenum are unremarkable. No small bowel dilatation or wall thickening. No colonic dilatation or wall thickening. The appendix is surgically absent. Scattered colonic diverticula without focal pericolonic inflammation to suggest diverticulitis. No mesenteric hematoma or mesenteric contusion is evident. Vascular/Lymphatic: Atherosclerotic plaque within the normal caliber aorta. Fusiform aneurysmal dilatation of the infrarenal abdominal aorta to 4.4 cm in size with eccentric calcified and noncalcified mural plaque. Additional plaque noted in the proximal great vessels including mild narrowing in the proximal splenic artery and at the IMA origin. Atheromatous disease is seen extending into the proximal inflow and outflow levels. Moderate narrowing at the distal right common iliac at the level of the bifurcation. No periaortic stranding or hemorrhage. No evidence of acute vascular injury or dissection. Retroaortic left renal vein is noted. No suspicious or enlarged lymph nodes in the included lymphatic chains. Reproductive: Mild prostatomegaly. Coarse eccentric benign calcifications. Other: No abdominopelvic free fluid  or air. No large abdominal wall dehiscence. Contusive changes noted across the left flank and chest wall. No bowel containing hernias. Musculoskeletal: Multilevel degenerative changes noted in the lower lumbar spine. Acute fracture or traumatic listhesis. Degenerative retrolisthesis L5 on S1 is noted with maximal discogenic changes at this level. Bony pelvis is intact. Proximal femora are intact and well seated within the acetabula. IMPRESSION: Traumatic 1. Posterior left fourth through twelfth minimally displaced rib fractures. Additional anterior left second through sixth rib fractures. Given the extent of fractures including contiguous segmental fractures, close clinical assessment for flail chest morphology is recommended. 2. Small left apical and medial pneumothorax and trace pleural thickening, likely hemothorax. Some dependent atelectasis versus pulmonary contusion noted in bases. 3. Subtle sclerotic band subjacent to the superior endplate cortex at T8 with some mild anterior wedging may reflect a subcortical impaction fracture. No adjacent paraspinal hemorrhage or visible canal hematoma. Nontraumatic 1. 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 2. Coronary artery atherosclerosis. 3. Colonic diverticulosis without evidence of acute diverticulitis. 4. 4.4 cm infrarenal abdominal aortic aneurysm. Reflects interval enlargement since 2017 comparison. Recommend followup by ultrasound in 1 year. This recommendation follows ACR consensus guidelines: White Paper of the ACR Incidental Findings Committee II on Vascular Findings. J Am Coll Radiol 2013; 10:789-794. Aortic aneurysm NOS (ICD10-I71.9). 5. Multilevel degenerative changes in the lower lumbar spine with associated grade 1 retrolisthesis L5 on S1. These results were called by telephone at the time of interpretation on 04/09/2019 at 5:46 pm to provider [facility] , who verbally acknowledged these results. Electronically Signed   By:  Kreg Shropshire M.D.   On: 04/09/2019 17:48   CT CERVICAL SPINE WO CONTRAST  Result Date: 04/09/2019 CLINICAL DATA:  Head trauma, headache, fell 17 feet from a tree. No obvious deformities. Abrasion to left posterior thigh, positive loss of  consciousness, GCS 15 EXAM: CT HEAD WITHOUT CONTRAST CT CERVICAL SPINE WITHOUT CONTRAST TECHNIQUE: Multidetector CT imaging of the head and cervical spine was performed following the standard protocol without intravenous contrast. Multiplanar CT image reconstructions of the cervical spine were also generated. COMPARISON:  CT 08/07/2017 FINDINGS: CT HEAD FINDINGS Brain: No evidence of acute infarction, hemorrhage, hydrocephalus, extra-axial collection or mass lesion/mass effect. Symmetric prominence of the ventricles, cisterns and sulci compatible with parenchymal volume loss. Patchy areas of white matter hypoattenuation are most compatible with chronic microvascular angiopathy. Vascular: Atherosclerotic calcification of the carotid siphons. No hyperdense vessel. Skull: No calvarial fracture or scalp swelling. Mild soft tissue thickening superficial to the left zygoma. Most compatible with a small dermal inclusion cyst. Few benign scalp calcifications are present. Sinuses/Orbits: Minimal mucosal thickening in the ethmoid sinuses. Remaining paranasal sinuses are predominantly clear. Hypo pneumatization of the mastoids with a left mastoid effusion and hyperostotic features similar to prior and suggesting chronicity. Soft tissue attenuation attic of the left middle ear cavity with destruction of the left ossicular chain most suspicious for a left cholesteatoma. Other: Small amount of debris in the external auditory canal on the right. CT CERVICAL SPINE FINDINGS Alignment: Cervical stabilization collar is in place at the time of examination. Preservation of the normal cervical lordosis without traumatic listhesis. No abnormally widened, perched or jumped facets. Normal alignment of  the craniocervical and atlantoaxial articulations. Skull base and vertebrae: No acute fracture. No primary bone lesion or focal pathologic process. Soft tissues and spinal canal: No pre or paravertebral fluid or swelling. No visible canal hematoma. Disc levels: Multilevel intervertebral disc height loss with spondylitic endplate changes. Features are most pronounced C4-C7 where disc osteophyte complexes efface the ventral thecal sac without significant canal stenosis. Mild uncinate spurring and facet arthropathic changes at these levels do result in mild bilateral neural foramina narrowing. Upper chest: No acute abnormality in the upper chest or imaged lung apices. Other: Normal thyroid. Question postsurgical changes along the posterior left thyroid lobe. Cervical carotid atherosclerosis is noted. IMPRESSION: 1. No CT evidence of acute intracranial process. No acute scalp swelling or calvarial fracture. 2. Mild parenchymal volume loss and chronic microvascular angiopathy. 3. Chronic left-sided mastoiditis. 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 4. No acute fracture or traumatic listhesis of the cervical spine. 5. Mild multilevel intervertebral spondylitic changes, most pronounced C4-C7. Detailed above. Electronically Signed   By: Kreg Shropshire M.D.   On: 04/09/2019 17:26   CT ABDOMEN PELVIS W CONTRAST  Result Date: 04/09/2019 CLINICAL DATA:  Lower back pain, trauma, fall approximately 17 foot from tree PE stating is EXAM: CT CHEST, ABDOMEN, AND PELVIS WITH CONTRAST TECHNIQUE: Multidetector CT imaging of the chest, abdomen and pelvis was performed following the standard protocol during bolus administration of intravenous contrast. CONTRAST:  OMNIPAQUE IOHEXOL 300 MG/ML  SOLN COMPARISON:  CT abdomen pelvis 07/24/2015 FINDINGS: CT CHEST FINDINGS Cardiovascular: The aorta is normal  caliber. No intramural hematoma, dissection flap or other acute luminal abnormality of the aorta is seen. No periaortic stranding or hemorrhage. Atheromatous plaque seen throughout the thoracic aorta and within the normally branching proximal great vessels including some mild proximal atheromatous narrowing of the left common carotid and subclavian arteries and right vertebral artery. Normal heart size. No pericardial effusion. Coronary artery calcifications are present central pulmonary arteries are normal caliber. No large central filling defects on this non  tailored examination of the pulmonary arteries. Mediastinum/Nodes: No mediastinal fluid or gas. Normal thyroid gland and thoracic inlet. No acute abnormality of the trachea or esophagus. No worrisome mediastinal, hilar or axillary adenopathy. Lungs/Pleura: Small left apical and medial pneumothorax and trace pleural thickening, likely hemothorax. Dependent ground-glass towards the lung bases most likely reflects atelectasis though some underlying pulmonary contusion could have a similar appearance. No other acute traumatic findings of the lung parenchyma. Musculoskeletal: Posterior left fourth through twelfth minimally displaced rib fractures. Additional anterior left second through sixth rib fractures. Findings result in segmental fractures of 4th to 6th ribs. No visible right rib fractures. Scapula appear intact. No acute traumatic abnormality of the imaged shoulder girdles. No sternal fracture is seen. Complete fusion across the sternomanubrial joint is noted incidentally. Subtle sclerotic band subjacent to the superior endplate cortex at T8 with some mild anterior wedging may reflect a subcortical impaction fracture. No adjacent paraspinal hemorrhage or visible canal hematoma. CT ABDOMEN PELVIS FINDINGS Hepatobiliary: No direct hepatic injury or perihepatic hematoma. Small subcentimeter hypoattenuating foci in the anterior left lobe and posterior right lobe  liver too small to fully characterize on CT imaging but statistically likely benign. No focal liver abnormality is seen. No gallstones, gallbladder wall thickening, or biliary dilatation. Mild low-attenuation nodule at the tip of the gallbladder fundus, likely adenomyomatosis. Pancreas: Normal uniform enhancement of the pancreas. No evidence of pancreatic contusion or ductal disruption. No peripancreatic inflammation. Spleen: No direct splenic injury or perisplenic hematoma. Adrenals/Urinary Tract: No adrenal hemorrhage or suspicious adrenal lesions. 4.5 cm cystic appearing lesion in the upper pole right kidney with thin inferior septation, Bosniak II. Few punctate nonobstructing calculi in both kidneys as well as several vascular calcifications. No obstructive urolithiasis or hydronephrosis. Kidneys enhance and excrete symmetrically without extravasation of contrast on excretory phase imaging. No evidence of direct bladder injury. Stomach/Bowel: Distal esophagus, stomach and duodenum are unremarkable. No small bowel dilatation or wall thickening. No colonic dilatation or wall thickening. The appendix is surgically absent. Scattered colonic diverticula without focal pericolonic inflammation to suggest diverticulitis. No mesenteric hematoma or mesenteric contusion is evident. Vascular/Lymphatic: Atherosclerotic plaque within the normal caliber aorta. Fusiform aneurysmal dilatation of the infrarenal abdominal aorta to 4.4 cm in size with eccentric calcified and noncalcified mural plaque. Additional plaque noted in the proximal great vessels including mild narrowing in the proximal splenic artery and at the IMA origin. Atheromatous disease is seen extending into the proximal inflow and outflow levels. Moderate narrowing at the distal right common iliac at the level of the bifurcation. No periaortic stranding or hemorrhage. No evidence of acute vascular injury or dissection. Retroaortic left renal vein is noted. No  suspicious or enlarged lymph nodes in the included lymphatic chains. Reproductive: Mild prostatomegaly. Coarse eccentric benign calcifications. Other: No abdominopelvic free fluid or air. No large abdominal wall dehiscence. Contusive changes noted across the left flank and chest wall. No bowel containing hernias. Musculoskeletal: Multilevel degenerative changes noted in the lower lumbar spine. Acute fracture or traumatic listhesis. Degenerative retrolisthesis L5 on S1 is noted with maximal discogenic changes at this level. Bony pelvis is intact. Proximal femora are intact and well seated within the acetabula. IMPRESSION: Traumatic 1. Posterior left fourth through twelfth minimally displaced rib fractures. Additional anterior left second through sixth rib fractures. Given the extent of fractures including contiguous segmental fractures, close clinical assessment for flail chest morphology is recommended. 2. Small left apical and medial pneumothorax and trace pleural thickening, likely hemothorax. Some dependent atelectasis versus pulmonary  contusion noted in bases. 3. Subtle sclerotic band subjacent to the superior endplate cortex at T8 with some mild anterior wedging may reflect a subcortical impaction fracture. No adjacent paraspinal hemorrhage or visible canal hematoma. Nontraumatic 1. 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 2. Coronary artery atherosclerosis. 3. Colonic diverticulosis without evidence of acute diverticulitis. 4. 4.4 cm infrarenal abdominal aortic aneurysm. Reflects interval enlargement since 2017 comparison. Recommend followup by ultrasound in 1 year. This recommendation follows ACR consensus guidelines: White Paper of the ACR Incidental Findings Committee II on Vascular Findings. J Am Coll Radiol 2013; 10:789-794. Aortic aneurysm NOS (ICD10-I71.9). 5. Multilevel degenerative changes in the lower lumbar spine with associated grade 1 retrolisthesis L5 on S1. These results  were called by telephone at the time of interpretation on 04/09/2019 at 5:46 pm to provider [facility] , who verbally acknowledged these results. Electronically Signed   By: Kreg ShropshirePrice  DeHay M.D.   On: 04/09/2019 17:48   DG Pelvis Portable  Result Date: 04/09/2019 CLINICAL DATA:  64 year old who fell approximately 17 feet out of a tree. Initial encounter. EXAM: PORTABLE PELVIS 1-2 VIEWS COMPARISON:  None. FINDINGS: No evidence of acute fracture. Hip joints anatomically aligned with well-preserved joint spaces. Sacroiliac joints and symphysis pubis anatomically aligned without diastasis and without degenerative change. Note is made of a distal abdominal aortic aneurysm measuring approximately 5 cm (uncorrected for magnification). IMPRESSION: 1. No acute osseous abnormality. 2. Distal abdominal aortic aneurysm measuring approximately 5 cm (uncorrected for magnification). This will be better evaluated on the CT abdomen and pelvis obtained subsequently. Electronically Signed   By: Hulan Saashomas  Lawrence M.D.   On: 04/09/2019 17:02   DG Chest Port 1 View  Result Date: 04/10/2019 CLINICAL DATA:  64 year old who fell approximately 17 feet out of a tree yesterday. Followup small LEFT pneumothorax related to multiple minimally displaced LEFT rib fractures. EXAM: PORTABLE CHEST 1 VIEW COMPARISON:  Chest x-rays 04/09/2019 and earlier. CT chest 04/09/2019. FINDINGS: Cardiac silhouette upper normal in size, unchanged. The trace apicomedial LEFT pneumothorax identified on CT is inconspicuous on the chest x-ray. Mild bibasilar atelectasis, slightly increased since yesterday. Lungs remain clear otherwise. Normal pulmonary vascularity. The LEFT rib fractures identified on CT are also inconspicuous apart from the fracture involving the anterolateral 6th rib fracture. IMPRESSION: 1. The trace apicomedial LEFT pneumothorax identified on CT is inconspicuous on the chest x-ray. 2. Mild bibasilar atelectasis. No acute cardiopulmonary  disease otherwise. Electronically Signed   By: Hulan Saashomas  Lawrence M.D.   On: 04/10/2019 08:23   DG Chest Port 1 View  Result Date: 04/09/2019 CLINICAL DATA:  Level 2 trauma. Pt fell 17 ft from a ladder leaning on a tree. EXAM: PORTABLE CHEST 1 VIEW COMPARISON:  Chest radiograph 12/14/2007 FINDINGS: Cardiomediastinal contours within normal limits. The lungs are clear. No pneumothorax or significant pleural effusion. Left lateral rib fracture. IMPRESSION: No acute cardiopulmonary abnormality. Left lateral rib fracture. Electronically Signed   By: Emmaline KluverNancy  Ball[place]yne M.D.   On: 04/09/2019 17:00    Assessment/Plan: 67M s/p Fall from ladder Concussion Left rib fractures 2 -12 with occult pneumothorax- CXR stable, pain control, IS, mobilize Abrasion posterior left lower extremity- local wound care Incidental finding of 4.4 cm AAA - f/u as outpt    LOS: 1 day    Dale Perez 04/10/2019

## 2019-04-10 NOTE — Plan of Care (Signed)

## 2019-04-10 NOTE — Progress Notes (Signed)
Pt in a lot a pain with rib fx. Discussed importance of pulmonary toilet via IS, C&DB, and possible flutter valve if needed. Provider also stressed this to prevent pneumonia. Instructed on IS use and using a pillow to splint chest when coughing. Medicated with PO meds and Dilaudid when getting up with PT to chair. Upon getting back to bed, had to give Dilaudid 1 mg IVP for severe pain.   Wife very concerned about AAA and wishes to speak to Trauma re cardiovascular consult while in hospital. Will pass on to next shift as well as this progress note.   Currently in bed, vital signs stable, medication effective. Wife present.  Gabriel Cirri RN

## 2019-04-10 NOTE — Evaluation (Signed)
Physical Therapy Evaluation Patient Details Name: Dale Perez MRN: [id number] DOB: 12/16/1955 Today's Date: 04/10/2019   History of Present Illness  THis 64 y.o. male admitted after 4ft fall from ladder while cutting tree lims.  He was + for brief LOC.  He was found to have:  Lt sided rib fractures 2-12 with tiny pneumothorax., concussion - head CT negative for acute abnormalit GCS 15.   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  PMH includes: ss/p shoulder athroscopy.   Clinical Impression   Pt admitted with above diagnosis. Patient's mobility limited due to significant pain in left torso, decreasing O2 saturation on 3L O2, and decreasing BP (likely due to IV pain medication given at beginning of session). Overall he required min assist with various aspects of mobility and max cues/education re: proper breathing and use of IS.  Pt currently with functional limitations due to the deficits listed below (see PT Problem List). Pt will benefit from skilled PT to increase their independence and safety with mobility to allow discharge to the venue listed below.       Follow Up Recommendations No PT follow up;Supervision - Intermittent    Equipment Recommendations  None recommended by PT    Recommendations for Other Services       Precautions / Restrictions Precautions Precautions: Fall;Other (comment) Precaution Comments: watch sats and BP Restrictions Weight Bearing Restrictions: No      Mobility  Bed Mobility Overal bed mobility: Needs Assistance Bed Mobility: Supine to Sit     Supine to sit: Min assist;+2 for safety/equipment;HOB elevated     General bed mobility comments: HOB maximally elevated and pt able to hold rt rail in rt hand and pivot towards rt EOB; min assist to fully scoot hips to EOB (using bed pad to minimize friction on posterior thigh)  Transfers Overall transfer level: Needs assistance Equipment used: 1 person hand held assist Transfers: Sit to/from  Stand Sit to Stand: Min assist;+2 safety/equipment;From elevated surface         General transfer comment: from EOB elevated to simulate bed at home and due to incr pain  Ambulation/Gait Ambulation/Gait assistance: Min assist;+2 safety/equipment Gait Distance (Feet): 45 Feet Assistive device: 1 person hand held assist Gait Pattern/deviations: Step-through pattern;Decreased stride length     General Gait Details: very slow, small steps; stood to rest and look out window ~3 minutes and then walked back to chair  Stairs            Wheelchair Mobility    Modified Rankin (Stroke Patients Only)       Balance Overall balance assessment: Mild deficits observed, not formally tested(likely due to pain medication and drowsiness)                                           Pertinent Vitals/Pain Pain Assessment: 0-10 Pain Score: 8  Pain Location: left chest, side and low back Pain Descriptors / Indicators: Grimacing;Guarding;Sharp Pain Intervention(s): Limited activity within patient's tolerance;Monitored during session;Premedicated before session;Repositioned(RN provided oral pain meds 30 min prior and IV meds at time )    Home Living Family/patient expects to be discharged to:: Private residence Living Arrangements: Spouse/significant other Available Help at Discharge: Family;Available 24 hours/day Type of Home: House Home Access: Stairs to enter Entrance Stairs-Rails: Can reach both Entrance Stairs-Number of Steps: 5 Home Layout: One level Home Equipment: None(may have  walker and shower seat from mother-in-law)      Prior Function Level of Independence: Independent         Comments: has been on Worker's Comp due to elevator accident required knee and shoulder surgery; was due to go back to work 04/26/19     Hand Dominance   Dominant Hand: Right    Extremity/Trunk Assessment   Upper Extremity Assessment Upper Extremity Assessment: Defer to OT  evaluation    Lower Extremity Assessment Lower Extremity Assessment: Overall WFL for tasks assessed(does have large scrape posterior left thigh)    Cervical / Trunk Assessment Cervical / Trunk Assessment: Other exceptions Cervical / Trunk Exceptions: using pillow and left UE to "splint" his chest with mobility and with coughing  Communication   Communication: No difficulties  Cognition Arousal/Alertness: Lethargic;Suspect due to medications Behavior During Therapy: Flat affect Overall Cognitive Status: Within Functional Limits for tasks assessed                                        General Comments General comments (skin integrity, edema, etc.): on 3L initially with probe on rt finger; lowest reading while walking 86% however once sitting in chair his sats continued to drop/fluctuate with lowest 80% (good waveform, but ?accuracy); new probe placed on left earlobe with sats registering 90-92%    Exercises     Assessment/Plan    PT Assessment Patient needs continued PT services  PT Problem List Decreased activity tolerance;Decreased mobility;Decreased knowledge of use of DME;Decreased safety awareness;Cardiopulmonary status limiting activity;Pain       PT Treatment Interventions DME instruction;Gait training;Functional mobility training;Therapeutic activities;Therapeutic exercise;Patient/family education    PT Goals (Current goals can be found in the Care Plan section)  Acute Rehab PT Goals Patient Stated Goal: ease pain; get home to see his dog "Bear" PT Goal Formulation: With patient Time For Goal Achievement: 04/24/19 Potential to Achieve Goals: Good    Frequency Min 5X/week   Barriers to discharge        Co-evaluation PT/OT/SLP Co-Evaluation/Treatment: Yes Reason for Co-Treatment: For patient/therapist safety;To address functional/ADL transfers;Other (comment)(due to pt's pain level/activity tolerance) PT goals addressed during session:  Mobility/safety with mobility         AM-PAC PT "6 Clicks" Mobility  Outcome Measure Help needed turning from your back to your side while in a flat bed without using bedrails?: A Lot Help needed moving from lying on your back to sitting on the side of a flat bed without using bedrails?: Total Help needed moving to and from a bed to a chair (including a wheelchair)?: A Little Help needed standing up from a chair using your arms (e.g., wheelchair or bedside chair)?: A Little Help needed to walk in hospital room?: A Little Help needed climbing 3-5 steps with a railing? : A Little 6 Click Score: 15    End of Session Equipment Utilized During Treatment: Oxygen Activity Tolerance: Patient limited by pain;Treatment limited secondary to medical complications (Comment) Patient left: in chair;with call bell/phone within reach;with chair alarm set Nurse Communication: Mobility status;Other (comment)(sats, probe changed, BP decr with activity) PT Visit Diagnosis: Difficulty in walking, not elsewhere classified (R26.2);Pain Pain - Right/Left: Left Pain - part of body: (torso)    Time: 1610-9604 PT Time Calculation (min) (ACUTE ONLY): 44 min   Charges:   PT Evaluation $PT Eval Low Complexity: 1 Low PT Treatments $Gait Training: 8-22 mins  Arby Barrette, PT Pager [phone]   Rexanne Mano 04/10/2019, 4:22 PM

## 2019-04-10 NOTE — Evaluation (Signed)
Occupational Therapy Evaluation Patient Details Name: Dale Perez MRN: [id number] DOB: January 19, 1956 Today's Date: 04/10/2019    History of Present Illness THis 64 y.o. male admitted after 72ft fall from ladder while cutting tree lims.  He was + for brief LOC.  He was found to have:  Lt sided rib fractures 2-12 with tiny pneumothorax., concussion - head CT negative for acute abnormalit GCS 15.   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  PMH includes: ss/p shoulder athroscopy.    Clinical Impression   Pt admitted with above. He demonstrates the below listed deficits and will benefit from continued OT to maximize safety and independence with BADLs.  Pt seen in conjunction with PT.  Pt presents to OT with increased pain with all mobility.   He is unable to access his feet for LB ADLs due to pain, and thus requires set up assist - total a for ADLs.  He is able to perform functional mobility with min A +2 for lines.   He lives with wife who is supportive and he was fully independent PTA.   Anticipate good progress.  Will follow acutely.       Follow Up Recommendations  No OT follow up;Supervision/Assistance - 24 hour(initially )    Equipment Recommendations  3 in 1 bedside commode(tub DME need to be determined )    Recommendations for Other Services       Precautions / Restrictions Precautions Precautions: Fall;Other (comment) Precaution Comments: watch sats and BP Restrictions Weight Bearing Restrictions: No      Mobility Bed Mobility Overal bed mobility: Needs Assistance Bed Mobility: Supine to Sit     Supine to sit: Min assist;+2 for safety/equipment;HOB elevated     General bed mobility comments: HOB maximally elevated and pt able to hold rt rail in rt hand and pivot towards rt EOB; min assist to fully scoot hips to EOB (using bed pad to minimize friction on posterior thigh)  Transfers Overall transfer level: Needs assistance Equipment used: 1 person hand held  assist Transfers: Sit to/from Stand Sit to Stand: Min assist;+2 safety/equipment;From elevated surface         General transfer comment: from EOB elevated to simulate bed at home and due to incr pain    Balance Overall balance assessment: Mild deficits observed, not formally tested(likely due to pain medication and drowsiness)                                         ADL either performed or assessed with clinical judgement   ADL Overall ADL's : Needs assistance/impaired Eating/Feeding: Set up;Sitting   Grooming: Wash/dry face;Wash/dry hands;Oral care;Minimal assistance;Sitting   Upper Body Bathing: Moderate assistance;Sitting   Lower Body Bathing: Maximal assistance;Sit to/from stand   Upper Body Dressing : Moderate assistance;Sitting   Lower Body Dressing: Total assistance;Sit to/from stand   Toilet Transfer: Minimal assistance;+2 for safety/equipment;Ambulation(2 for lines )   Toileting- Clothing Manipulation and Hygiene: Moderate assistance;Sit to/from stand       Functional mobility during ADLs: Minimal assistance       Vision         Perception     Praxis      Pertinent Vitals/Pain Pain Assessment: 0-10 Pain Score: 8  Pain Location: left chest, side and low back Pain Descriptors / Indicators: Grimacing;Guarding;Sharp Pain Intervention(s): Monitored during session;Limited activity within patient's tolerance;Repositioned;Premedicated before session  Hand Dominance Right   Extremity/Trunk Assessment Upper Extremity Assessment Upper Extremity Assessment: Generalized weakness;RUE deficits/detail;LUE deficits/detail RUE Deficits / Details: difficult to fullly assess strength of bil. UEs and full AROM Of shoulders due to rib pain    Lower Extremity Assessment Lower Extremity Assessment: Defer to PT evaluation   Cervical / Trunk Assessment Cervical / Trunk Assessment: Other exceptions Cervical / Trunk Exceptions: using pillow and left  UE to "splint" his chest with mobility and with coughing   Communication Communication Communication: No difficulties   Cognition Arousal/Alertness: Lethargic;Suspect due to medications Behavior During Therapy: Flat affect Overall Cognitive Status: Within Functional Limits for tasks assessed                                     General Comments  on 3L initially with probe on rt finger; lowest reading while walking 86% however once sitting in chair his sats continued to drop/fluctuate with lowest 80% (good waveform, but ?accuracy); new probe placed on left earlobe with sats registering 90-92%    Exercises     Shoulder Instructions      Home Living Family/patient expects to be discharged to:: Private residence Living Arrangements: Spouse/significant other Available Help at Discharge: Family;Available 24 hours/day Type of Home: House Home Access: Stairs to enter [employer] of Steps: 5 Entrance Stairs-Rails: Can reach both Home Layout: One level     Bathroom Shower/Tub: Tub/shower unit;Curtain   Bathroom Toilet: Standard(bathroom he uses; other has handicap )     Home Equipment: None(may have walker and shower seat from mother-in-law)          Prior Functioning/Environment Level of Independence: Independent        Comments: has been on Worker's Comp due to elevator accident required knee and shoulder surgery; was due to go back to work 04/26/19        OT Problem List: Decreased strength;Decreased activity tolerance;Decreased knowledge of use of DME or AE;Cardiopulmonary status limiting activity;Pain      OT Treatment/Interventions: Self-care/ADL training;Energy conservation;DME and/or AE instruction;Therapeutic activities;Patient/family education    OT Goals(Current goals can be found in the care plan section) Acute Rehab OT Goals Patient Stated Goal: ease pain; get home to see his dog "Bear" OT Goal Formulation: With patient Time For Goal  Achievement: 04/24/19 Potential to Achieve Goals: Good ADL Goals Pt Will Perform Grooming: standing;with supervision Pt Will Perform Upper Body Bathing: with set-up;sitting Pt Will Perform Lower Body Bathing: sit to/from stand;with supervision;with adaptive equipment Pt Will Perform Upper Body Dressing: with set-up;sitting Pt Will Perform Lower Body Dressing: with supervision;sit to/from stand;with adaptive equipment Pt Will Transfer to Toilet: with supervision;ambulating;regular height toilet;bedside commode;grab bars Pt Will Perform Toileting - Clothing Manipulation and hygiene: with supervision;sit to/from stand;with adaptive equipment Pt Will Perform Tub/Shower Transfer: Tub transfer;with min guard assist;ambulating;shower seat;3 in 1  OT Frequency: Min 2X/week   Barriers to D/C:            Co-evaluation PT/OT/SLP Co-Evaluation/Treatment: Yes Reason for Co-Treatment: For patient/therapist safety PT goals addressed during session: Mobility/safety with mobility OT goals addressed during session: Strengthening/ROM      AM-PAC OT "6 Clicks" Daily Activity     Outcome Measure Help from another person eating meals?: None Help from another person taking care of personal grooming?: A Little Help from another person toileting, which includes using toliet, bedpan, or urinal?: A Lot Help from another person bathing (including washing,  rinsing, drying)?: A Lot Help from another person to put on and taking off regular upper body clothing?: A Lot Help from another person to put on and taking off regular lower body clothing?: Total 6 Click Score: 14   End of Session Equipment Utilized During Treatment: Oxygen Nurse Communication: Mobility status  Activity Tolerance: Patient tolerated treatment well;Patient limited by pain Patient left: in chair;with call bell/phone within reach  OT Visit Diagnosis: Pain Pain - Right/Left: Left Pain - part of body: (chest/ribs )                Time:  0092-3300 OT Time Calculation (min): 44 min Charges:  OT General Charges $OT Visit: 1 Visit OT Evaluation $OT Eval Moderate Complexity: 1 Mod  Aaylah Pokorny C., OTR/L Acute Rehabilitation Services Pager [phone] Office [phone]   Jeani Hawking M 04/10/2019, 5:33 PM

## 2019-04-11 ENCOUNTER — Inpatient Hospital Stay (HOSPITAL_COMMUNITY): Payer: BC Managed Care – PPO

## 2019-04-11 DIAGNOSIS — S2242XA Multiple fractures of ribs, left side, initial encounter for closed fracture: Secondary | ICD-10-CM | POA: Diagnosis not present

## 2019-04-11 DIAGNOSIS — R0602 Shortness of breath: Secondary | ICD-10-CM | POA: Diagnosis not present

## 2019-04-11 DIAGNOSIS — S060X9A Concussion with loss of consciousness of unspecified duration, initial encounter: Secondary | ICD-10-CM | POA: Diagnosis not present

## 2019-04-11 DIAGNOSIS — S270XXA Traumatic pneumothorax, initial encounter: Secondary | ICD-10-CM | POA: Diagnosis not present

## 2019-04-11 LAB — EXPECTORATED SPUTUM ASSESSMENT W GRAM STAIN, RFLX TO RESP C

## 2019-04-11 LAB — MRSA PCR SCREENING: MRSA by PCR: NEGATIVE

## 2019-04-11 MED ORDER — NICOTINE 21 MG/24HR TD PT24
21.0000 mg | MEDICATED_PATCH | Freq: Every day | TRANSDERMAL | Status: DC
  Administered 2019-04-11 – 2019-04-18 (×8): 21 mg via TRANSDERMAL
  Filled 2019-04-11 (×8): qty 1

## 2019-04-11 MED ORDER — LEVOFLOXACIN 500 MG PO TABS
750.0000 mg | ORAL_TABLET | Freq: Every day | ORAL | Status: DC
  Administered 2019-04-11 – 2019-04-12 (×2): 750 mg via ORAL
  Filled 2019-04-11 (×2): qty 2

## 2019-04-11 NOTE — Progress Notes (Signed)
Approximately 0600 pt began to desat on 3L [place]. Venturi mask applied with pt recovering into upper 80's before a nonrebreather mask was applied. Pt's O2 sats recovered into the low 90's on NRB. On-call provider notified and order for STAT CXR received. Breathing treatment administered and pt was was placed back on Venturi mask after treatment. Pt currently sating in the mid 90's. Oncoming RN updated and will continue to monitor pt.   Allegra Grana RN

## 2019-04-11 NOTE — Progress Notes (Signed)
Physical Therapy Treatment Patient Details Name: Dale Perez MRN: [id number] DOB: March 15, 1955 Today's Date: 04/11/2019    History of Present Illness THis 64 y.o. male admitted after 42ft fall from ladder while cutting tree lims.  He was + for brief LOC.  He was found to have:  Lt sided rib fractures 2-12 with tiny pneumothorax., concussion - head CT negative for acute abnormalit GCS 15.   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  PMH includes: ss/p shoulder athroscopy.     PT Comments    Patient slow to rise due to significant pain with transitions.  Able to increase distance with ambulation this session and had less assist for supine to sit.  RR up to 30's, but on venturi mask @ 5LPM able to maintain sats >90%. PT to continue to follow daily.  Recommend nursing assist for ambulation between sessions as well.   Follow Up Recommendations  No PT follow up;Supervision - Intermittent     Equipment Recommendations  None recommended by PT    Recommendations for Other Services       Precautions / Restrictions Precautions Precautions: Fall Precaution Comments: O2 dependent Restrictions Weight Bearing Restrictions: No    Mobility  Bed Mobility Overal bed mobility: Needs Assistance Bed Mobility: Supine to Sit     Supine to sit: HOB elevated;Min assist;Mod assist     General bed mobility comments: assist to splint with pillow and lift trunk  Transfers Overall transfer level: Needs assistance Equipment used: Rolling walker (2 wheeled) Transfers: Sit to/from Stand Sit to Stand: Min assist;From elevated surface         General transfer comment: elevated bed, sat EOB about 10 minutes prior to standing  Ambulation/Gait Ambulation/Gait assistance: Min guard Gait Distance (Feet): 90 Feet Assistive device: Rolling walker (2 wheeled) Gait Pattern/deviations: Step-through pattern;Decreased stride length;Trunk flexed     General Gait Details: slow pace, pausing in hallway due  to pain, on Venturi mask @ 5LPM RN reports to transition back to [place] today, patient with SpO2 92%, reluctant to cough with audible secretions   Stairs             Wheelchair Mobility    Modified Rankin (Stroke Patients Only)       Balance Overall balance assessment: Needs assistance   Sitting balance-Leahy Scale: Fair       Standing balance-Leahy Scale: Fair                              Cognition Arousal/Alertness: Awake/alert Behavior During Therapy: Flat affect;WFL for tasks assessed/performed Overall Cognitive Status: Within Functional Limits for tasks assessed                                        Exercises      General Comments General comments (skin integrity, edema, etc.): wife arrived in session and supportive      Pertinent Vitals/Pain Pain Score: 9  Pain Location: left chest, side and low back Pain Descriptors / Indicators: Grimacing;Guarding;Sharp Pain Intervention(s): Monitored during session;Other (comment);Premedicated before session(splinting)    Home Living                      Prior Function            PT Goals (current goals can now be found in the care plan  section) Progress towards PT goals: Progressing toward goals    Frequency    Min 5X/week      PT Plan Current plan remains appropriate    Co-evaluation              AM-PAC PT "6 Clicks" Mobility   Outcome Measure  Help needed turning from your back to your side while in a flat bed without using bedrails?: A Lot Help needed moving from lying on your back to sitting on the side of a flat bed without using bedrails?: A Lot Help needed moving to and from a bed to a chair (including a wheelchair)?: A Little Help needed standing up from a chair using your arms (e.g., wheelchair or bedside chair)?: A Little Help needed to walk in hospital room?: A Little Help needed climbing 3-5 steps with a railing? : A Little 6 Click Score: 16     End of Session Equipment Utilized During Treatment: Oxygen Activity Tolerance: Patient limited by pain Patient left: in chair;with call bell/phone within reach;with family/visitor present   PT Visit Diagnosis: Difficulty in walking, not elsewhere classified (R26.2);Pain Pain - Right/Left: Left Pain - part of body: (chest)     Time: 5027-7412 PT Time Calculation (min) (ACUTE ONLY): 38 min  Charges:  $Gait Training: 8-22 mins $Therapeutic Activity: 23-37 mins                     Sheran Lawless, [place] Acute Rehabilitation Services [phone] 04/11/2019    Elray Mcgregor 04/11/2019, 11:32 AM

## 2019-04-11 NOTE — Progress Notes (Signed)
Patient ID: Dale Perez, male   DOB: [birth year], 64 y.o.   MRN: [id number]     Subjective: Productive cough, hurts to take deep breaths Doing IS  ROS negative except as listed above. Objective: Vital signs in last 24 hours: Temp:  [97.5 F (36.4 C)-103.1 F (39.5 C)] 99.1 F (37.3 C) (03/08 0809) Pulse Rate:  [60-96] 71 (03/08 0809) Resp:  [17-26] 20 (03/08 0809) BP: (120-157)/(63-86) 129/63 (03/08 0809) SpO2:  [91 %-98 %] 98 % (03/08 0809) Last BM Date: 04/09/19  Intake/Output from previous day: 03/07 0701 - 03/08 0700 In: 600 [P.O.:600] Out: 1775 [Urine:1775] Intake/Output this shift: Total I/O In: 350 [P.O.:350] Out: 200 [Urine:200]  General appearance: alert and cooperative Resp: rhonchi L Chest wall: left sided chest wall tenderness Cardio: regular rate and rhythm GI: soft, NT Extremities: calves soft  Lab Results: CBC  Recent Labs    04/09/19 1645 04/09/19 1645 04/09/19 1647 04/10/19 0458  WBC 10.6*  --   --  8.7  HGB 15.2   < > 16.0 13.7  HCT 49.3   < > 47.0 43.2  PLT 257  --   --  231   < > = values in this interval not displayed.   BMET Recent Labs    04/09/19 1645 04/09/19 1645 04/09/19 1647 04/10/19 0458  NA 139   < > 139 139  K 4.6   < > 4.4 4.3  CL 105   < > 104 105  CO2 22  --   --  25  GLUCOSE 135*   < > 132* 100*  BUN 20   < > 22 16  CREATININE 0.97   < > 0.90 0.84  CALCIUM 9.5  --   --  8.5*   < > = values in this interval not displayed.   PT/INR Recent Labs    04/09/19 1645  LABPROT 12.5  INR 0.9   ABG No results for input(s): PHART, HCO3 in the last 72 hours.  Invalid input(s): PCO2, PO2  Anti-infectives: Anti-infectives (From admission, onward)   Start     Dose/Rate Route Frequency Ordered Stop   04/11/19 1000  levofloxacin (LEVAQUIN) tablet 750 mg     750 mg Oral Daily 04/11/19 0839        Assessment/Plan: 77M s/p Fall from ladder Concussion Left rib fractures 2 -12 with occult pneumothorax- CXR stable,  pain control, IS, add flutter valve ID - fever and productive cough. Sputum CX and start PO Levaquin empiric. Abrasion posterior left lower extremity- local wound care Incidental finding of 4.4 cm AAA - f/u as outpt with [facility] FEN - diet, nicotine patch VTE - Lovenox Dispo - PT/OT   LOS: 2 days    Violeta Gelinas, MD, MPH, FACS Trauma & General Surgery Use [email or web address] to contact on call provider  04/11/2019

## 2019-04-11 NOTE — TOC Initial Note (Signed)
Transition of Care [facility]) - Initial/Assessment Note    Patient Details  Name: Dale Perez MRN: [id number] Date of Birth: 1955-07-02  Transition of Care [facility]) CM/SW Contact:    Blima Ledger Phone Number: [phone] 04/11/2019, 12:23 PM  Clinical Narrative:                 THis 64 y.o. male admitted after 52ft fall from ladder while cutting tree limbs.  He was + for brief LOC.  He was found to have:  Lt sided rib fractures 2-12 with tiny pneumothorax., concussion - head CT negative for acute abnormalit GCS 15. PTA pt was living at home with his wife and was independent with all ADL's. Pt wife states she will be able to work from home upon pt discharge to provide 24 hr care. PT/OT are recommending 24 hour supervision. CSW completed SBIRT. Pt denies ETOH use.   Emeterio Reeve, [place], [place] Licensed Holiday representative  Expected Discharge Plan: Home/Self Care Barriers to Discharge: Continued Medical Work up   Patient Goals and CMS Choice Patient states their goals for this hospitalization and ongoing recovery are:: To go back home      Expected Discharge Plan and Services Expected Discharge Plan: Home/Self Care   Discharge Planning Services: CM Consult   Living arrangements for the past 2 months: Single Family Home                                      Prior Living Arrangements/Services Living arrangements for the past 2 months: Single Family Home Lives with:: Self, Spouse Patient language and need for interpreter reviewed:: Yes Do you feel safe going back to the place where you live?: Yes      Need for Family Participation in Patient Care: Yes (Comment) Care giver support system in place?: Yes (comment)   Criminal Activity/Legal Involvement Pertinent to Current Situation/Hospitalization: No - Comment as needed  Activities of Daily Living Home Assistive Devices/Equipment: Eyeglasses, Dentures (specify type) ADL Screening (condition at time of  admission) Patient's cognitive ability adequate to safely complete daily activities?: Yes Is the patient deaf or have difficulty hearing?: No Does the patient have difficulty seeing, even when wearing glasses/contacts?: No Does the patient have difficulty concentrating, remembering, or making decisions?: No Patient able to express need for assistance with ADLs?: Yes Does the patient have difficulty dressing or bathing?: Yes Independently performs ADLs?: No Communication: Independent Is this a change from baseline?: Pre-admission baseline Dressing (OT): Needs assistance Is this a change from baseline?: Change from baseline, expected to last >3 days Grooming: Independent with device (comment) Feeding: Independent Bathing: Needs assistance Is this a change from baseline?: Change from baseline, expected to last >3 days Toileting: Needs assistance Is this a change from baseline?: Change from baseline, expected to last >3days In/Out Bed: Needs assistance Is this a change from baseline?: Change from baseline, expected to last >3 days Walks in Home: Dependent Is this a change from baseline?: Change from baseline, expected to last >3 days Does the patient have difficulty walking or climbing stairs?: Yes Weakness of Legs: None Weakness of Arms/Hands: None  Permission Sought/Granted                  Emotional Assessment Appearance:: Appears stated age Attitude/Demeanor/Rapport: Engaged Affect (typically observed): Accepting Orientation: : Oriented to Self, Oriented to Place, Oriented to  Time, Oriented to Situation Alcohol /  Substance Use: Never Used    Admission diagnosis:  Hemothorax [J94.2] Trauma [T14.90XA] AAA (abdominal aortic aneurysm) without rupture (HCC) [I71.4] Left rib fracture [S22.32XA] Closed fracture of multiple ribs of left side, initial encounter [S22.42XA] Contusion of left lung, initial encounter [S27.321A] Patient Active Problem List   Diagnosis Date Noted   . Left rib fracture 04/09/2019   PCP:  [facility] Pharmacy:   [facility] [phone] - [place], [place] - [address] SEC [address] [place] [address] Phone: [phone] Fax: [phone]     Social Determinants of Health (SDOH) Interventions    Readmission Risk Interventions No flowsheet data found.

## 2019-04-11 NOTE — Progress Notes (Signed)
Pt resistant to working with IS. States it makes him feel like he will "upchuck". He has had increased pain since after PT this shift. Pain meds help but he is still very uncomfortable. Will continue to monitor. Mayford Knife RN

## 2019-04-12 DIAGNOSIS — S270XXA Traumatic pneumothorax, initial encounter: Secondary | ICD-10-CM | POA: Diagnosis not present

## 2019-04-12 DIAGNOSIS — J154 Pneumonia due to other streptococci: Secondary | ICD-10-CM | POA: Diagnosis not present

## 2019-04-12 DIAGNOSIS — S272XXA Traumatic hemopneumothorax, initial encounter: Secondary | ICD-10-CM | POA: Diagnosis not present

## 2019-04-12 DIAGNOSIS — S060X9A Concussion with loss of consciousness of unspecified duration, initial encounter: Secondary | ICD-10-CM | POA: Diagnosis not present

## 2019-04-12 DIAGNOSIS — S22068A Other fracture of T7-T8 thoracic vertebra, initial encounter for closed fracture: Secondary | ICD-10-CM | POA: Diagnosis not present

## 2019-04-12 DIAGNOSIS — S2242XA Multiple fractures of ribs, left side, initial encounter for closed fracture: Secondary | ICD-10-CM | POA: Diagnosis not present

## 2019-04-12 LAB — BASIC METABOLIC PANEL
Anion gap: 10 (ref 5–15)
BUN: 6 mg/dL — ABNORMAL LOW (ref 8–23)
CO2: 24 mmol/L (ref 22–32)
Calcium: 8.5 mg/dL — ABNORMAL LOW (ref 8.9–10.3)
Chloride: 99 mmol/L (ref 98–111)
Creatinine, Ser: 0.76 mg/dL (ref 0.61–1.24)
GFR calc Af Amer: >60 mL/min (ref >60)
GFR calc non Af Amer: >60 mL/min (ref >60)
Glucose, Bld: 141 mg/dL — ABNORMAL HIGH (ref 70–99)
Potassium: 4.1 mmol/L (ref 3.5–5.1)
Sodium: 133 mmol/L — ABNORMAL LOW (ref 135–145)

## 2019-04-12 LAB — CBC
HCT: 41.8 % (ref 39.0–52.0)
Hemoglobin: 13.7 g/dL (ref 13.0–17.0)
MCH: 29 pg (ref 26.0–34.0)
MCHC: 32.8 g/dL (ref 30.0–36.0)
MCV: 88.4 fL (ref 80.0–100.0)
Platelets: 208 10*3/uL (ref 150–400)
RBC: 4.73 MIL/uL (ref 4.22–5.81)
RDW: 13.8 % (ref 11.5–15.5)
WBC: 9.1 10*3/uL (ref 4.0–10.5)
nRBC: 0 % (ref 0.0–0.2)

## 2019-04-12 LAB — MAGNESIUM: Magnesium: 1.8 mg/dL (ref 1.7–2.4)

## 2019-04-12 MED ORDER — METOPROLOL TARTRATE 5 MG/5ML IV SOLN
5.0000 mg | Freq: Four times a day (QID) | INTRAVENOUS | Status: AC | PRN
  Administered 2019-04-12: 5 mg via INTRAVENOUS
  Filled 2019-04-12: qty 5

## 2019-04-12 MED ORDER — MAGNESIUM OXIDE 400 (241.3 MG) MG PO TABS
800.0000 mg | ORAL_TABLET | Freq: Once | ORAL | Status: AC
  Administered 2019-04-12: 800 mg via ORAL
  Filled 2019-04-12: qty 2

## 2019-04-12 MED ORDER — FUROSEMIDE 10 MG/ML IJ SOLN
40.0000 mg | Freq: Once | INTRAMUSCULAR | Status: AC
  Administered 2019-04-12: 40 mg via INTRAVENOUS
  Filled 2019-04-12: qty 4

## 2019-04-12 MED ORDER — TRAMADOL HCL 50 MG PO TABS
50.0000 mg | ORAL_TABLET | Freq: Four times a day (QID) | ORAL | Status: DC
  Administered 2019-04-12 – 2019-04-14 (×8): 50 mg via ORAL
  Filled 2019-04-12 (×8): qty 1

## 2019-04-12 NOTE — Progress Notes (Signed)
RT  Found pt with O2 off and spo2 77%. Placed pt back on VM as he previously had on. The mask was set at 10L/45% however the flowmeter was only set to 5L, RT increased to 10L per VM . Spo2 increased to 87%. Pt with congestion and encouraged to cough to clear secretions.

## 2019-04-12 NOTE — Progress Notes (Signed)
PT's HR ranged between 80-150 on heart monitor. RN checked on pt and pt is asymptomatic. EKG was performed and placed in pt's chart. MD called and notified. New orders received to stop IV fluids, Change metoprolol order per [facility] and order stat labs orders followed and lab called for stat results.

## 2019-04-12 NOTE — Progress Notes (Signed)
Physical Therapy Treatment Patient Details Name: Dale Perez MRN: [id number] DOB: 21-Oct-1955 Today's Date: 04/12/2019    History of Present Illness 64 y.o. male admitted after 42ft fall from ladder while cutting tree lims.  He was + for brief LOC.  He was found to have:  Lt sided rib fractures 2-12 with tiny pneumothorax., concussion - head CT negative for acute abnormalit GCS 15.   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 04/11/19 episode of HR up to 150 at rest. Required incr to 10L venturi mask.  PMH includes: s/p shoulder athroscopy.     PT Comments    Patient up in recliner on arrival. States recliner feels better than bed on his ribs, however his "butt" is hurting and can't tolerate the chair any longer (despite pillows in seat). Asked nursing secretary to order air cushion for pt--not stock item on this floor. Patient required max encouragement and incr time to scoot out to edge of chair. Incr time to initiate sit to stand and then reporting dizziness and nausea. BP stable, however pt insisted he was too dizzy to walk around bed to far side. Stand-pivot to bed and assisted to supine. RN made aware of pt's reported dizziness despite appropriate response of BP and HR max 84.    Follow Up Recommendations  No PT follow up;Supervision - Intermittent     Equipment Recommendations  None recommended by PT    Recommendations for Other Services       Precautions / Restrictions Precautions Precautions: Fall Precaution Comments: SpO2 90s on 3L O2 Restrictions Weight Bearing Restrictions: No    Mobility  Bed Mobility Overal bed mobility: Needs Assistance Bed Mobility: Sit to Supine     Supine to sit: Mod assist;HOB elevated Sit to supine: Min assist;HOB elevated   General bed mobility comments: entered bed on left side due to pt unable to walk around bed because of dizziness (explained to pt/wife that it would be les painful entering bed on the right side, but felt he could not  walk that far)  Transfers Overall transfer level: Needs assistance Equipment used: Rolling walker (2 wheeled) Transfers: Sit to/from [facility] Sit to Stand: Min guard;From elevated surface(recliner with pillows in seat) Stand pivot transfers: Min assist       General transfer comment: no assist to come to stand using RUE on armrest; vc for technique; hands-on guarding during pivot with RW chair to bed due to dizziness and nausea.  Ambulation/Gait             General Gait Details: pt unable/refused due to dizziness; BP stable; ?med effect   Stairs             Wheelchair Mobility    Modified Rankin (Stroke Patients Only)       Balance Overall balance assessment: Needs assistance Sitting-balance support: No upper extremity supported;Feet supported Sitting balance-Leahy Scale: Fair     Standing balance support: Bilateral upper extremity supported;During functional activity Standing balance-Leahy Scale: Poor                              Cognition Arousal/Alertness: Lethargic;Suspect due to medications Behavior During Therapy: Flat affect Overall Cognitive Status: Impaired/Different from baseline Area of Impairment: Following commands;Problem solving;Awareness                       Following Commands: Follows one step commands with increased time  Awareness: Emergent Problem Solving: Slow processing;Requires verbal cues;Decreased initiation General Comments: incr time to process and follow commands; ?due to pain meds      Exercises      General Comments General comments (skin integrity, edema, etc.): Sats 100% on 4L [place] throughout session; HR max 84; BP 114/70 seated, 130/76 supine      Pertinent Vitals/Pain Pain Assessment: 0-10 Pain Score: 10-Worst pain ever Pain Location: left chest, side, low back, and buttocks Pain Descriptors / Indicators: Grimacing;Guarding;Sharp Pain Intervention(s): Limited activity  within patient's tolerance;Monitored during session;Premedicated before session;Repositioned    Home Living                      Prior Function            PT Goals (current goals can now be found in the care plan section) Acute Rehab PT Goals Patient Stated Goal: ease pain; get home to see his dog "Bear" Time For Goal Achievement: 04/24/19 Potential to Achieve Goals: Good Progress towards PT goals: Progressing toward goals    Frequency    Min 5X/week      PT Plan Current plan remains appropriate    Co-evaluation              AM-PAC PT "6 Clicks" Mobility   Outcome Measure  Help needed turning from your back to your side while in a flat bed without using bedrails?: A Lot Help needed moving from lying on your back to sitting on the side of a flat bed without using bedrails?: A Lot Help needed moving to and from a bed to a chair (including a wheelchair)?: A Little Help needed standing up from a chair using your arms (e.g., wheelchair or bedside chair)?: A Little Help needed to walk in hospital room?: A Little Help needed climbing 3-5 steps with a railing? : Total 6 Click Score: 14    End of Session Equipment Utilized During Treatment: Oxygen Activity Tolerance: Patient limited by pain;Treatment limited secondary to medical complications (Comment)(dizziness, nausea) Patient left: with call bell/phone within reach;with family/visitor present;in bed;with bed alarm set Nurse Communication: Mobility status;Other (comment)(unusual onset dizziness; nausea) PT Visit Diagnosis: Difficulty in walking, not elsewhere classified (R26.2);Pain Pain - Right/Left: Left Pain - part of body: (chest)     Time: 1347-1425(full time not billed due to interruption (dietary)) PT Time Calculation (min) (ACUTE ONLY): 38 min  Charges:  $Therapeutic Activity: 23-37 mins                      Jerolyn Center, PT Pager [phone]    Zena Amos 04/12/2019, 2:45 PM

## 2019-04-12 NOTE — Progress Notes (Signed)
Occupational Therapy Treatment Patient Details Name: Dale Perez MRN: [id number] DOB: [birth year] Today's Date: 04/12/2019    History of present illness 64 y.o. male admitted after 33ft fall from ladder while cutting tree lims.  He was + for brief LOC.  He was found to have:  Lt sided rib fractures 2-12 with tiny pneumothorax., concussion - head CT negative for acute abnormalit GCS 15.   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  PMH includes: ss/p shoulder athroscopy.    OT comments  Pt progressing towards established OT goals. Pt limited by significant pain and requiring increased time throughout. Pt requiring Mod A for bed mobility and then significant rest break at EOB. Pt performing stand pivot to recliner with Min A and RW. SPO2 staying in high 90s on 3L O2. HR stable in 90s and RR 20-30s until end of session when HR elevated to 120s and RR to 40s. Continue to recommend dc to home once medically stable. However, pending his progress, pt may require HHOT for increasing activity tolerance and independence with ADLs. Will continue to follow acutely as admitted.    Follow Up Recommendations  No OT follow up;Supervision/Assistance - 24 hour(Intital 24/7)    Equipment Recommendations  3 in 1 bedside commode(tub DME need to be determined )    Recommendations for Other Services PT consult    Precautions / Restrictions Precautions Precautions: Fall Precaution Comments: SpO2 90s on 3L O2 Restrictions Weight Bearing Restrictions: No       Mobility Bed Mobility Overal bed mobility: Needs Assistance Bed Mobility: Supine to Sit     Supine to sit: Mod assist;HOB elevated     General bed mobility comments: Mod A for elevating trunk into upright posture. Pt declining to perform log roll to right  Transfers Overall transfer level: Needs assistance Equipment used: Rolling walker (2 wheeled) Transfers: Sit to/from [facility] Sit to Stand: Min assist;From elevated  surface Stand pivot transfers: Min assist       General transfer comment: Min A to power up and maintain balance to recliner    Balance Overall balance assessment: Needs assistance Sitting-balance support: No upper extremity supported;Feet supported Sitting balance-Leahy Scale: Fair     Standing balance support: Bilateral upper extremity supported;During functional activity Standing balance-Leahy Scale: Poor                             ADL either performed or assessed with clinical judgement   ADL Overall ADL's : Needs assistance/impaired                         Toilet Transfer: Minimal assistance;Stand-pivot;RW Toilet Transfer Details (indicate cue type and reason): Min A for balance and safety.          Functional mobility during ADLs: Minimal assistance;Rolling walker(stand pivot) General ADL Comments: Pt very limited by pain this session.     Vision       Perception     Praxis      Cognition Arousal/Alertness: Awake/alert Behavior During Therapy: Flat affect;Anxious Overall Cognitive Status: Impaired/Different from baseline Area of Impairment: Following commands;Problem solving;Awareness                       Following Commands: Follows one step commands with increased time   Awareness: Emergent Problem Solving: Slow processing;Requires verbal cues          Exercises  Shoulder Instructions       General Comments Wife present throughout session. SpO2 maintaining in 90s on 3L O2. HR stable in 90s until end of session; once seated in recliner, HR elevating to 120s and RR 40s.     Pertinent Vitals/ Pain       Pain Assessment: 0-10 Pain Score: 10-Worst pain ever Pain Location: left chest, side and low back Pain Descriptors / Indicators: Grimacing;Guarding;Sharp Pain Intervention(s): Monitored during session;Limited activity within patient's tolerance;Repositioned  Home Living                                           Prior Functioning/Environment              Frequency  Min 2X/week        Progress Toward Goals  OT Goals(current goals can now be found in the care plan section)  Progress towards OT goals: Progressing toward goals  Acute Rehab OT Goals Patient Stated Goal: ease pain; get home to see his dog "Bear" OT Goal Formulation: With patient Time For Goal Achievement: 04/24/19 Potential to Achieve Goals: Good ADL Goals Pt Will Perform Grooming: standing;with supervision Pt Will Perform Upper Body Bathing: with set-up;sitting Pt Will Perform Lower Body Bathing: sit to/from stand;with supervision;with adaptive equipment Pt Will Perform Upper Body Dressing: with set-up;sitting Pt Will Perform Lower Body Dressing: with supervision;sit to/from stand;with adaptive equipment Pt Will Transfer to Toilet: with supervision;ambulating;regular height toilet;bedside commode;grab bars Pt Will Perform Toileting - Clothing Manipulation and hygiene: with supervision;sit to/from stand;with adaptive equipment Pt Will Perform Tub/Shower Transfer: Tub transfer;with min guard assist;ambulating;shower seat;3 in 1  Plan Discharge plan remains appropriate    Co-evaluation                 AM-PAC OT "6 Clicks" Daily Activity     Outcome Measure   Help from another person eating meals?: None Help from another person taking care of personal grooming?: A Little Help from another person toileting, which includes using toliet, bedpan, or urinal?: A Lot Help from another person bathing (including washing, rinsing, drying)?: A Lot Help from another person to put on and taking off regular upper body clothing?: A Lot Help from another person to put on and taking off regular lower body clothing?: Total 6 Click Score: 14    End of Session Equipment Utilized During Treatment: Oxygen;Rolling walker(3L)  OT Visit Diagnosis: Pain Pain - Right/Left: Left Pain - part of body: (Ribs)    Activity Tolerance Patient limited by pain   Patient Left in chair;with call bell/phone within reach;with family/visitor present   Nurse Communication Mobility status;Patient requests pain meds        Time: 1202-1247 OT Time Calculation (min): 45 min  Charges: OT General Charges $OT Visit: 1 Visit OT Treatments $Self Care/Home Management : 38-52 mins  Dale Perez MSOT, OTR/L Acute Rehab Pager: [phone] Office: [phone]   Theodoro Grist Dale Perez 04/12/2019, 1:15 PM

## 2019-04-12 NOTE — Progress Notes (Signed)
Patient ID: LANSON RANDLE, male   DOB: 03-Feb-1956, 64 y.o.   MRN: [id number]     Subjective: C/O L rib pain Episode of afib overnight  ROS negative except as listed above. Objective: Vital signs in last 24 hours: Temp:  [98.7 F (37.1 C)-100.3 F (37.9 C)] 98.9 F (37.2 C) (03/09 0800) Pulse Rate:  [68-99] 78 (03/09 0849) Resp:  [16-30] 26 (03/09 0849) BP: (113-161)/(68-87) 116/68 (03/09 0849) SpO2:  [89 %-100 %] 98 % (03/09 0800) FiO2 (%):  [45 %] 45 % (03/09 0849) Weight:  [74.3 kg] 74.3 kg (03/09 0226) Last BM Date: 04/09/19  Intake/Output from previous day: 03/08 0701 - 03/09 0700 In: 3043.7 [P.O.:970; I.V.:1973.7; IV Piggyback:100] Out: 2500 [Urine:2500] Intake/Output this shift: Total I/O In: -  Out: 600 [Urine:600]  General appearance: alert and cooperative Resp: clear to auscultation bilaterally  L chest wall tender Abd soft, NT Ext no edema A&O  Lab Results: CBC  Recent Labs    04/10/19 0458 04/12/19 0140  WBC 8.7 9.1  HGB 13.7 13.7  HCT 43.2 41.8  PLT 231 208   BMET Recent Labs    04/10/19 0458 04/12/19 0140  NA 139 133*  K 4.3 4.1  CL 105 99  CO2 25 24  GLUCOSE 100* 141*  BUN 16 6*  CREATININE 0.84 0.76  CALCIUM 8.5* 8.5*   PT/INR Recent Labs    04/09/19 1645  LABPROT 12.5  INR 0.9   ABG No results for input(s): PHART, HCO3 in the last 72 hours.  Invalid input(s): PCO2, PO2  Studies/Results: DG CHEST PORT 1 VIEW  Result Date: 04/11/2019 CLINICAL DATA:  Short of breath.  Left rib fracture from a fall. EXAM: PORTABLE CHEST 1 VIEW COMPARISON:  04/10/2019 FINDINGS: Stable appearance of the left anterior 6 rib fracture. Other fractures seen on the prior CT are not well visualized. There is mild lung base opacity consistent with atelectasis. Remainder of the lungs is clear. No convincing pleural effusion and no pneumothorax. Cardiac silhouette is normal in size. No mediastinal or hilar masses. IMPRESSION: 1. No significant change from  the previous day's exam. Mild lung base opacity consistent with atelectasis. 2. No acute cardiopulmonary disease.  No pneumothorax. Electronically Signed   By: Amie Portland M.D.   On: 04/11/2019 06:53   Results for orders placed or performed during the hospital encounter of 04/09/19  Respiratory Panel by RT PCR (Flu A&B, Covid) - Nasopharyngeal Swab     Status: None   Collection Time: 04/09/19  5:51 PM   Specimen: Nasopharyngeal Swab  Result Value Ref Range Status   SARS Coronavirus 2 by RT PCR NEGATIVE NEGATIVE Final    Comment: (NOTE) SARS-CoV-2 target nucleic acids are NOT DETECTED. The SARS-CoV-2 RNA is generally detectable in upper respiratoy specimens during the acute phase of infection. The lowest concentration of SARS-CoV-2 viral copies this assay can detect is 131 copies/mL. A negative result does not preclude SARS-Cov-2 infection and should not be used as the sole basis for treatment or other patient management decisions. A negative result may occur with  improper specimen collection/handling, submission of specimen other than nasopharyngeal swab, presence of viral mutation(s) within the areas targeted by this assay, and inadequate number of viral copies (<131 copies/mL). A negative result must be combined with clinical observations, patient history, and epidemiological information. The expected result is Negative. Fact Sheet for Patients:  https://www.moore.com/ Fact Sheet for Healthcare Providers:  https://www.young.biz/ This test is not yet ap proved or cleared by  the [employer] and  has been authorized for detection and/or diagnosis of SARS-CoV-2 by FDA under an Emergency Use Authorization (EUA). This EUA will remain  in effect (meaning this test can be used) for the duration of the COVID-19 declaration under Section 564(b)(1) of the Act, 21 U.S.C. section 360bbb-3(b)(1), unless the authorization is terminated or revoked  sooner.    Influenza A by PCR NEGATIVE NEGATIVE Final   Influenza B by PCR NEGATIVE NEGATIVE Final    Comment: (NOTE) The Xpert Xpress SARS-CoV-2/FLU/RSV assay is intended as an aid in  the diagnosis of influenza from Nasopharyngeal swab specimens and  should not be used as a sole basis for treatment. Nasal washings and  aspirates are unacceptable for Xpert Xpress SARS-CoV-2/FLU/RSV  testing. Fact Sheet for Patients: PinkCheek.be Fact Sheet for Healthcare Providers: GravelBags.it This test is not yet approved or cleared by the [place] FDA and  has been authorized for detection and/or diagnosis of SARS-CoV-2 by  FDA under an Emergency Use Authorization (EUA). This EUA will remain  in effect (meaning this test can be used) for the duration of the  Covid-19 declaration under Section 564(b)(1) of the Act, 21  U.S.C. section 360bbb-3(b)(1), unless the authorization is  terminated or revoked. Performed at [facility], [place] [address] [place], [place] [address]   MRSA PCR Screening     Status: None   Collection Time: 04/10/19  2:51 AM   Specimen: Nasal Mucosa; Nasopharyngeal  Result Value Ref Range Status   MRSA by PCR NEGATIVE NEGATIVE Final    Comment:        The GeneXpert MRSA Assay (FDA approved for NASAL specimens only), is one component of a comprehensive MRSA colonization surveillance program. It is not intended to diagnose MRSA infection nor to guide or monitor treatment for MRSA infections. Performed at [facility], [place] [address] [place], [place] [address]   Expectorated sputum assessment w rflx to resp cult     Status: None   Collection Time: 04/11/19 12:32 PM   Specimen: Expectorated Sputum  Result Value Ref Range Status   Specimen Description EXPECTORATED SPUTUM  Final   Special Requests NONE  Final   Sputum evaluation   Final    THIS SPECIMEN IS ACCEPTABLE FOR SPUTUM CULTURE Performed  at [facility], [place] [address] [place], [place] [address]    Report Status 04/11/2019 FINAL  Final  Culture, respiratory     Status: None (Preliminary result)   Collection Time: 04/11/19 12:32 PM  Result Value Ref Range Status   Specimen Description EXPECTORATED SPUTUM  Final   Special Requests NONE Reflexed from [id number]  Final   Gram Stain   Final    ABUNDANT WBC PRESENT, PREDOMINANTLY PMN ABUNDANT GRAM NEGATIVE RODS ABUNDANT GRAM POSITIVE COCCI IN PAIRS IN CHAINS    Culture   Final    CULTURE REINCUBATED FOR BETTER GROWTH Performed at [facility], [place] [address] [place], [place] [address]    Report Status PENDING  Incomplete    Anti-infectives: Anti-infectives (From admission, onward)   Start     Dose/Rate Route Frequency Ordered Stop   04/11/19 1000  levofloxacin (LEVAQUIN) tablet 750 mg     750 mg Oral Daily 04/11/19 0839        Assessment/Plan: 16M s/p Fall from ladder Concussion Left rib fractures 2 -12 with occult pneumothorax - pulm toilet, add scheduled Ultram ID - fever and productive cough. Sputum CX reincubated, Levaquin  empiric. A fib - episode overnight, resolved after lopressor, lasix X1 now Abrasion posterior left lower extremity- local wound care Incidental finding of 4.4 cm AAA - f/u as outpt with Dr. Chestine Spore [facility] FEN - diet, nicotine patch, lasix above VTE - Lovenox Dispo - PT/OT    LOS: 3 days    Violeta Gelinas, MD, MPH, FACS Trauma & General Surgery Use [email or web address] to contact on call provider  04/12/2019

## 2019-04-13 LAB — CULTURE, RESPIRATORY W GRAM STAIN

## 2019-04-13 LAB — BASIC METABOLIC PANEL
Anion gap: 10 (ref 5–15)
BUN: 13 mg/dL (ref 8–23)
CO2: 28 mmol/L (ref 22–32)
Calcium: 8.5 mg/dL — ABNORMAL LOW (ref 8.9–10.3)
Chloride: 96 mmol/L — ABNORMAL LOW (ref 98–111)
Creatinine, Ser: 0.83 mg/dL (ref 0.61–1.24)
GFR calc Af Amer: >60 mL/min (ref >60)
GFR calc non Af Amer: >60 mL/min (ref >60)
Glucose, Bld: 105 mg/dL — ABNORMAL HIGH (ref 70–99)
Potassium: 3.7 mmol/L (ref 3.5–5.1)
Sodium: 134 mmol/L — ABNORMAL LOW (ref 135–145)

## 2019-04-13 MED ORDER — CEFDINIR 300 MG PO CAPS
300.0000 mg | ORAL_CAPSULE | Freq: Two times a day (BID) | ORAL | Status: DC
  Administered 2019-04-13 – 2019-04-18 (×11): 300 mg via ORAL
  Filled 2019-04-13 (×12): qty 1

## 2019-04-13 NOTE — Progress Notes (Signed)
Physical Therapy Treatment Patient Details Name: Dale Perez MRN: [id number] DOB: [birth year] Today's Date: 04/13/2019    History of Present Illness 64 y.o. male admitted after 17ft fall from ladder while cutting tree lims.  He was + for brief LOC.  He was found to have:  Lt sided rib fractures 2-12 with tiny pneumothorax., concussion - head CT negative for acute abnormalit GCS 15.   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 04/11/19 episode of HR up to 150 at rest. Required incr to 10L venturi mask.  PMH includes: s/p shoulder athroscopy.     PT Comments    Pt in severe pain this session, RN gave oral pain medication during session. Pt requiring light physical assist for bed mobility at this point, and max encouragement to progress mobility but limited due to severe pain. Pt with multiple productive coughs sitting EOB this session. PT spent good amount of time educating pt and wife on the importance of mobility for further PNA prevention and secretion management. PT also discussed best bed mobility technique, PT recommending bed exit via R side of bed given L side body pain and had pt's wife physically practice bed mobility assist with safe body mechanics. Lastly, pt educated pt on use of incentive spirometry and potentially beneficial flutter valve for breaking up secretions. PT updated plan to reflect HHPT, as pt has not progressed with mobility well and his course has been complicated by PNA, SpO2 issues. Will continue to follow acutely.    Follow Up Recommendations  Supervision - Intermittent;Home health PT     Equipment Recommendations  None recommended by PT    Recommendations for Other Services       Precautions / Restrictions Precautions Precautions: Fall Precaution Comments: SpO2 90s on 3L O2 Restrictions Weight Bearing Restrictions: No    Mobility  Bed Mobility Overal bed mobility: Needs Assistance Bed Mobility: Sit to Supine;Supine to Sit     Supine to sit: Min  assist;HOB elevated Sit to supine: Min assist;HOB elevated   General bed mobility comments: min assist with HOB elevated to 50* for trunk elevation off bed, LE management, scooting to and from EOB, and positioning up in bed upon return to supine. Exited bed on L per pt request, encouraged pt and wife to exit bed on R upon d/c for pt comfort (will practice tomorrow prior to d/c).  Transfers Overall transfer level: Needs assistance Equipment used: Rolling walker (2 wheeled) Transfers: Sit to/from Stand Sit to Stand: Min guard;From elevated surface(recliner with pillows in seat) Stand pivot transfers: Min assist       General transfer comment: Min guard for safety, increased time to rise and lower due to pain. Verbal cuing for technique including hand placement and backing up to bed until he feels it on his legs. RRmax 37 breaths/min, HR low 100s, SpO2 85% on RA requiring 2Lo2. Verbal cuing for breathing technique.  Ambulation/Gait Ambulation/Gait assistance: Min guard Gait Distance (Feet): 5 Feet Assistive device: Rolling walker (2 wheeled) Gait Pattern/deviations: Step-through pattern;Decreased stride length;Trunk flexed Gait velocity: decr   General Gait Details: Min guard for safety, pt unwilling pt place LUE on RW due to severe Lsided pain from rib fractures. pt perfusely sweating during mobility, suspect secondary to pain. Ambulated 5 ft forawrd and 5 ft backward.   Stairs             Wheelchair Mobility    Modified Rankin (Stroke Patients Only)       Balance Overall balance  assessment: Needs assistance Sitting-balance support: No upper extremity supported;Feet supported Sitting balance-Leahy Scale: Fair Sitting balance - Comments: Pt sat EOB x10 minutes prior and post- ambulation due to pain   Standing balance support: During functional activity;Single extremity supported Standing balance-Leahy Scale: Poor Standing balance comment: reliant on ext support                             Cognition Arousal/Alertness: Lethargic;Suspect due to medications Behavior During Therapy: Flat affect;Restless Overall Cognitive Status: Within Functional Limits for tasks assessed Area of Impairment: Problem solving;Awareness                       Following Commands: Follows one step commands with increased time   Awareness: Emergent   General Comments: Pt slightly irritable this session, suspect secondary to pain. Requires increased time to mobilize, and multimodal cuing.      Exercises      General Comments        Pertinent Vitals/Pain Pain Assessment: Faces Faces Pain Scale: Hurts worst Pain Location: L chest, L upper back Pain Descriptors / Indicators: Grimacing;Guarding;Sharp;Stabbing Pain Intervention(s): Limited activity within patient's tolerance;Monitored during session;RN gave pain meds during session;Repositioned    Home Living                      Prior Function            PT Goals (current goals can now be found in the care plan section) Acute Rehab PT Goals Patient Stated Goal: ease pain; get home to see his dog "Bear" Time For Goal Achievement: 04/24/19 Potential to Achieve Goals: Good Progress towards PT goals: Progressing toward goals    Frequency    Min 5X/week      PT Plan Discharge plan needs to be updated    Co-evaluation              AM-PAC PT "6 Clicks" Mobility   Outcome Measure  Help needed turning from your back to your side while in a flat bed without using bedrails?: A Lot Help needed moving from lying on your back to sitting on the side of a flat bed without using bedrails?: A Little Help needed moving to and from a bed to a chair (including a wheelchair)?: A Little Help needed standing up from a chair using your arms (e.g., wheelchair or bedside chair)?: A Little Help needed to walk in hospital room?: A Lot Help needed climbing 3-5 steps with a railing? : Total 6  Click Score: 14    End of Session Equipment Utilized During Treatment: Oxygen Activity Tolerance: Patient limited by pain(dizziness, nausea) Patient left: with call bell/phone within reach;with family/visitor present;in bed Nurse Communication: Mobility status;Other (comment)(severe pain) PT Visit Diagnosis: Difficulty in walking, not elsewhere classified (R26.2);Pain Pain - Right/Left: Left Pain - part of body: (chest)     Time: 1446-1530 PT Time Calculation (min) (ACUTE ONLY): 44 min  Charges:  $Gait Training: 8-22 mins $Therapeutic Activity: 8-22 mins $Self Care/Home Management: 8-22                    Avyaan Summer E, PT Acute Rehabilitation Services Pager [phone]  Office [phone]   Toya Palacios D Deontae Robson 04/13/2019, 5:04 PM

## 2019-04-13 NOTE — Progress Notes (Signed)
Patient ID: Dale Perez, male   DOB: 04-26-1955, 64 y.o.   MRN: [id number]     Subjective: Off O2 Working on IS Plans to quit smoking  ROS negative except as listed above. Objective: Vital signs in last 24 hours: Temp:  [98.4 F (36.9 C)-100.4 F (38 C)] 98.7 F (37.1 C) (03/10 0739) Pulse Rate:  [69-92] 69 (03/10 0739) Resp:  [18-29] 18 (03/10 0739) BP: (109-154)/(63-82) 109/73 (03/10 0739) SpO2:  [94 %-99 %] 94 % (03/10 0739) Last BM Date: 04/09/19  Intake/Output from previous day: 03/09 0701 - 03/10 0700 In: 350 [P.O.:350] Out: 1800 [Urine:1800] Intake/Output this shift: Total I/O In: -  Out: 350 [Urine:350]  General appearance: alert and cooperative Resp: clear after cough Chest wall: left sided chest wall tenderness Cardio: regular rate and rhythm GI: soft, NT Extremities: calves soft  Lab Results: CBC  Recent Labs    04/12/19 0140  WBC 9.1  HGB 13.7  HCT 41.8  PLT 208   BMET Recent Labs    04/12/19 0140 04/13/19 0525  NA 133* 134*  K 4.1 3.7  CL 99 96*  CO2 24 28  GLUCOSE 141* 105*  BUN 6* 13  CREATININE 0.76 0.83  CALCIUM 8.5* 8.5*   PT/INR No results for input(s): LABPROT, INR in the last 72 hours. ABG No results for input(s): PHART, HCO3 in the last 72 hours.  Invalid input(s): PCO2, PO2  Studies/Results: No results found.  Anti-infectives: Anti-infectives (From admission, onward)   Start     Dose/Rate Route Frequency Ordered Stop   04/11/19 1000  levofloxacin (LEVAQUIN) tablet 750 mg     750 mg Oral Daily 04/11/19 0839       Results for orders placed or performed during the hospital encounter of 04/09/19  Respiratory Panel by RT PCR (Flu A&B, Covid) - Nasopharyngeal Swab     Status: None   Collection Time: 04/09/19  5:51 PM   Specimen: Nasopharyngeal Swab  Result Value Ref Range Status   SARS Coronavirus 2 by RT PCR NEGATIVE NEGATIVE Final    Comment: (NOTE) SARS-CoV-2 target nucleic acids are NOT DETECTED. The  SARS-CoV-2 RNA is generally detectable in upper respiratoy specimens during the acute phase of infection. The lowest concentration of SARS-CoV-2 viral copies this assay can detect is 131 copies/mL. A negative result does not preclude SARS-Cov-2 infection and should not be used as the sole basis for treatment or other patient management decisions. A negative result may occur with  improper specimen collection/handling, submission of specimen other than nasopharyngeal swab, presence of viral mutation(s) within the areas targeted by this assay, and inadequate number of viral copies (<131 copies/mL). A negative result must be combined with clinical observations, patient history, and epidemiological information. The expected result is Negative. Fact Sheet for Patients:  https://www.moore.com/ Fact Sheet for Healthcare Providers:  https://www.young.biz/ This test is not yet ap proved or cleared by the [place] FDA and  has been authorized for detection and/or diagnosis of SARS-CoV-2 by FDA under an Emergency Use Authorization (EUA). This EUA will remain  in effect (meaning this test can be used) for the duration of the COVID-19 declaration under Section 564(b)(1) of the Act, 21 U.S.C. section 360bbb-3(b)(1), unless the authorization is terminated or revoked sooner.    Influenza A by PCR NEGATIVE NEGATIVE Final   Influenza B by PCR NEGATIVE NEGATIVE Final    Comment: (NOTE) The Xpert Xpress SARS-CoV-2/FLU/RSV assay is intended as an aid in  the diagnosis of influenza from  Nasopharyngeal swab specimens and  should not be used as a sole basis for treatment. Nasal washings and  aspirates are unacceptable for Xpert Xpress SARS-CoV-2/FLU/RSV  testing. Fact Sheet for Patients: https://www.moore.com/ Fact Sheet for Healthcare Providers: https://www.young.biz/ This test is not yet approved or cleared by the [place] FDA and  has been authorized for detection and/or diagnosis of SARS-CoV-2 by  FDA under an Emergency Use Authorization (EUA). This EUA will remain  in effect (meaning this test can be used) for the duration of the  Covid-19 declaration under Section 564(b)(1) of the Act, 21  U.S.C. section 360bbb-3(b)(1), unless the authorization is  terminated or revoked. Performed at [facility], [address], [place], [place] [address]   MRSA PCR Screening     Status: None   Collection Time: 04/10/19  2:51 AM   Specimen: Nasal Mucosa; Nasopharyngeal  Result Value Ref Range Status   MRSA by PCR NEGATIVE NEGATIVE Final    Comment:        The GeneXpert MRSA Assay (FDA approved for NASAL specimens only), is one component of a comprehensive MRSA colonization surveillance program. It is not intended to diagnose MRSA infection nor to guide or monitor treatment for MRSA infections. Performed at [facility], [address], [place], [place] [address]   Expectorated sputum assessment w rflx to resp cult     Status: None   Collection Time: 04/11/19 12:32 PM   Specimen: Expectorated Sputum  Result Value Ref Range Status   Specimen Description EXPECTORATED SPUTUM  Final   Special Requests NONE  Final   Sputum evaluation   Final    THIS SPECIMEN IS ACCEPTABLE FOR SPUTUM CULTURE Performed at [facility], [address] [place], [place] [address]    Report Status 04/11/2019 FINAL  Final  Culture, respiratory     Status: None   Collection Time: 04/11/19 12:32 PM  Result Value Ref Range Status   Specimen Description EXPECTORATED SPUTUM  Final   Special Requests NONE Reflexed from [id number]  Final   Gram Stain   Final    ABUNDANT WBC PRESENT, PREDOMINANTLY PMN ABUNDANT GRAM NEGATIVE RODS ABUNDANT GRAM POSITIVE COCCI IN PAIRS IN CHAINS Performed at [facility], [address] [place], [place] [address]    Culture ABUNDANT STREPTOCOCCUS PNEUMONIAE  Final   Report  Status 04/13/2019 FINAL  Final   Organism ID, Bacteria STREPTOCOCCUS PNEUMONIAE  Final      Susceptibility   Streptococcus pneumoniae - MIC*    ERYTHROMYCIN >=8 RESISTANT Resistant     LEVOFLOXACIN >=16 RESISTANT Resistant     VANCOMYCIN 0.5 SENSITIVE Sensitive     PENICILLIN (meningitis) 1 RESISTANT Resistant     PENO - penicillin 1      PENICILLIN (non-meningitis) 1 SENSITIVE Sensitive     PENICILLIN (oral) 1 INTERMEDIATE Intermediate     CEFTRIAXONE (non-meningitis) 1 SENSITIVE Sensitive     CEFTRIAXONE (meningitis) 1 INTERMEDIATE Intermediate     * ABUNDANT STREPTOCOCCUS PNEUMONIAE     Assessment/Plan: 85M s/p Fall from ladder Concussion Left rib fractures 2 -12 with occult pneumothorax - pulm toilet, scheduled Ultram ID - strep PNA resistant to several ABX, D?W pharmacy and await recs A fib - no further episodes Abrasion posterior left lower extremity- local wound care Incidental finding of 4.4 cm AAA - f/u as outpt with Dr. Chestine Spore [facility] FEN - diet, nicotine patch VTE - Lovenox Dispo - did well with PT/OT, likely home later  this AM if stays off O2. I discussed smoking cessation with him. He plans to quit and try to get his wife to quit also.   LOS: 4 days    Georganna Skeans, MD, MPH, FACS Trauma & General Surgery Use [email or web address] to contact on call provider  04/13/2019

## 2019-04-14 MED ORDER — FUROSEMIDE 10 MG/ML IJ SOLN
40.0000 mg | Freq: Once | INTRAMUSCULAR | Status: AC
  Administered 2019-04-14: 40 mg via INTRAVENOUS
  Filled 2019-04-14: qty 4

## 2019-04-14 MED ORDER — IPRATROPIUM-ALBUTEROL 0.5-2.5 (3) MG/3ML IN SOLN
3.0000 mL | Freq: Three times a day (TID) | RESPIRATORY_TRACT | Status: DC
  Administered 2019-04-15 – 2019-04-16 (×4): 3 mL via RESPIRATORY_TRACT
  Filled 2019-04-14 (×4): qty 3

## 2019-04-14 MED ORDER — METHOCARBAMOL 1000 MG/10ML IJ SOLN
1000.0000 mg | Freq: Three times a day (TID) | INTRAMUSCULAR | Status: DC
  Filled 2019-04-14: qty 10

## 2019-04-14 MED ORDER — TRAMADOL HCL 50 MG PO TABS
100.0000 mg | ORAL_TABLET | Freq: Four times a day (QID) | ORAL | Status: DC
  Administered 2019-04-14 – 2019-04-18 (×16): 100 mg via ORAL
  Filled 2019-04-14 (×17): qty 2

## 2019-04-14 MED ORDER — METHOCARBAMOL 500 MG PO TABS
1000.0000 mg | ORAL_TABLET | Freq: Three times a day (TID) | ORAL | Status: DC
  Administered 2019-04-14 – 2019-04-18 (×14): 1000 mg via ORAL
  Filled 2019-04-14 (×14): qty 2

## 2019-04-14 MED ORDER — IPRATROPIUM-ALBUTEROL 0.5-2.5 (3) MG/3ML IN SOLN
3.0000 mL | Freq: Four times a day (QID) | RESPIRATORY_TRACT | Status: DC
  Administered 2019-04-14 (×3): 3 mL via RESPIRATORY_TRACT
  Filled 2019-04-14 (×3): qty 3

## 2019-04-14 NOTE — Progress Notes (Signed)
I spoke with the patients wife and updated her on patients progress. Time was given for all questions to be answered.  PT discussed with me after working with him in the room. They are now recommending rehab. Will place consult.

## 2019-04-14 NOTE — Progress Notes (Signed)
Inpatient Rehab Admissions:  Inpatient Rehab Consult received.  I met with patient and his wife at the bedside for rehabilitation assessment and to discuss goals and expectations of an inpatient rehab admission.  Pt with significant spasms/pain throughout my visit.  Both open to CIR to maximize independence and assist with O2 wean (pt not requiring O2 prior to admission).  Note required up to 10L on NRB with PT today, will need to discuss with rehab MD to clarify parameters for transition to CIR with high level of O2.  Will begin insurance authorization with updated PT/OT notes.    Signed: Shann Medal, PT, DPT Admissions Coordinator [phone] 04/14/19  4:03 PM

## 2019-04-14 NOTE — Progress Notes (Addendum)
Subjective: CC: Rib pain Patient reports that yesterday after working with PT had increased amount of left-sided rib cage pain as well as shortness of breath.  He required 3 L of O2 when ambulating with PT.  Currently on 2 L.  Trying to use I-S and pulling 500.  He is tolerating a diet without any nausea or vomiting.  No other complaints.  Objective: Vital signs in last 24 hours: Temp:  [98.3 F (36.8 C)-99.3 F (37.4 C)] 98.4 F (36.9 C) (03/11 0805) Pulse Rate:  [62-80] 74 (03/11 0805) Resp:  [11-20] 19 (03/11 0805) BP: (112-152)/(67-89) 152/89 (03/11 0805) SpO2:  [95 %-100 %] 97 % (03/11 0805) Last BM Date: 04/09/19  Intake/Output from previous day: 03/10 0701 - 03/11 0700 In: 500 [P.O.:500] Out: 1600 [Urine:1600] Intake/Output this shift: Total I/O In: 400 [P.O.:400] Out: 400 [Urine:400]  PE: Gen:  Alert, NAD, pleasant HEENT: EOM's intact, pupils equal and round Card:  RRR Pulm: Left-sided Rales with some expiratory wheezing throughout.  Mild tachypnea.  On 2 L O2.  Left chest wall tenderness. Abd: Soft, NT/ND, +BS Ext:  No LE edema  Psych: A&Ox3  Skin: no rashes noted, warm and dry  Lab Results:  Recent Labs    04/12/19 0140  WBC 9.1  HGB 13.7  HCT 41.8  PLT 208   BMET Recent Labs    04/12/19 0140 04/13/19 0525  NA 133* 134*  K 4.1 3.7  CL 99 96*  CO2 24 28  GLUCOSE 141* 105*  BUN 6* 13  CREATININE 0.76 0.83  CALCIUM 8.5* 8.5*   PT/INR No results for input(s): LABPROT, INR in the last 72 hours. CMP     Component Value Date/Time   NA 134 (L) 04/13/2019 0525   K 3.7 04/13/2019 0525   CL 96 (L) 04/13/2019 0525   CO2 28 04/13/2019 0525   GLUCOSE 105 (H) 04/13/2019 0525   BUN 13 04/13/2019 0525   CREATININE 0.83 04/13/2019 0525   CALCIUM 8.5 (L) 04/13/2019 0525   PROT 6.8 04/09/2019 1645   ALBUMIN 4.0 04/09/2019 1645   AST 27 04/09/2019 1645   ALT 26 04/09/2019 1645   ALKPHOS 104 04/09/2019 1645   BILITOT 0.4 04/09/2019 1645   GFRNONAA >60 04/13/2019 0525   GFRAA >60 04/13/2019 0525   Lipase  No results found for: LIPASE     Studies/Results: No results found.  Anti-infectives: Anti-infectives (From admission, onward)   Start     Dose/Rate Route Frequency Ordered Stop   04/13/19 1000  cefdinir (OMNICEF) capsule 300 mg     300 mg Oral Every 12 hours 04/13/19 0929     04/11/19 1000  levofloxacin (LEVAQUIN) tablet 750 mg  Status:  Discontinued     750 mg Oral Daily 04/11/19 0839 04/13/19 0915       Assessment/Plan 73M s/p Fall from ladder Concussion - f/u concussion clinic Left rib fractures 2 -12 with occult pneumothorax - pulm toilet (flutter valve, IS), scheduled duoneb Abrasion posterior left lower extremity- local wound care Incidental finding of4.4cm AAA - f/u as outpt with Dr. Carlis Abbott [facility] Long standing tobacco abuse - Nicotine patch  Possible subcortical impaction fracture of T8 - Noted on CT on arrival. Patient denies pain to this area.  A fib - episode 3/10, resolved after lopressor ID - PNA, Strep PNA on CX's. Cefdinir per Pharm FEN - Reg, nicotine patch VTE - SCDs, Lovenox Dispo - PT/OT now rec HH. Desat screen.  LOS: 5 days    Jacinto Halim , [facility] 04/14/2019, 9:02 AM Please see Amion for pager number during day hours 7:00am-4:30pm

## 2019-04-14 NOTE — Progress Notes (Signed)
Occupational Therapy Treatment Patient Details Name: Dale Perez MRN: [id number] DOB: 03/16/1955 Today's Date: 04/14/2019    History of present illness 64 y.o. male admitted after 60ft fall from ladder while cutting tree lims.  He was + for brief LOC.  He was found to have:  Lt sided rib fractures 2-12 with tiny pneumothorax., concussion - head CT negative for acute abnormalit GCS 15.   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 04/11/19 episode of HR up to 150 at rest. Required incr to 10L venturi mask.  PMH includes: s/p shoulder athroscopy.    OT comments  Pt demonstrating decreased occupational performance and is impacted by his cognition, dyspnea, and pain. Pt performing functional mobility with Min A and single hand held; requiring significant time due to processing and pain. Pt benefiting from calm environment to increased attention to task and following of commands. SpO2 maintaining in 90s with 6L O2 via [place]. Discussed with wife concussion/TBI symptoms. Due to change in functional performance, update dc recommendation to CIR for intensive OT to optimize safety and independence with ADLs as well as decrease caregiver burden.     Follow Up Recommendations  CIR;Supervision/Assistance - 24 hour    Equipment Recommendations  Other (comment)(Defer to next venue)    Recommendations for Other Services PT consult    Precautions / Restrictions Precautions Precautions: Fall Precaution Comments: watch sats Restrictions Weight Bearing Restrictions: No       Mobility Bed Mobility Overal bed mobility: Needs Assistance Bed Mobility: Supine to Sit     Supine to sit: Mod assist;HOB elevated     General bed mobility comments: exit bed to right with HOB fully elevated (pt plans to sleep in recliner at home, therefore did not practice logroll, side to sit) and pt splinting pillow to his left side with LUE; incr time with pt moving his legs off rt side of bed, grasping edge of bed frame and  pivoting on rt hip to come to sit EOB; mod assist to raise from propped on rt elbow up to full sitting  Transfers Overall transfer level: Needs assistance Equipment used: 1 person hand held assist Transfers: Sit to/from [employer] Sit to Stand: Min assist         General transfer comment: Min A to power up and gain balance in standing    Balance Overall balance assessment: Needs assistance Sitting-balance support: No upper extremity supported;Feet supported Sitting balance-Leahy Scale: Fair     Standing balance support: During functional activity;Single extremity supported Standing balance-Leahy Scale: Fair Standing balance comment: Able to maintain static standing without UE support                           ADL either performed or assessed with clinical judgement   ADL Overall ADL's : Needs assistance/impaired                                             Vision       Perception     Praxis      Cognition Arousal/Alertness: Awake/alert Behavior During Therapy: Anxious Overall Cognitive Status: Impaired/Different from baseline Area of Impairment: Following commands;Problem solving;Awareness;Memory;Attention;Rancho level               Rancho Levels of Cognitive Functioning [place]ant Scales of Cognitive Functioning: Automatic/appropriate  Current Attention Level: Selective Memory: Decreased short-term memory Following Commands: Follows one step commands with increased time;Follows multi-step commands inconsistently;Follows multi-step commands with increased time(internally distracted by pain)   Awareness: Emergent Problem Solving: Slow processing;Requires verbal cues;Decreased initiation;Difficulty sequencing;Requires tactile cues General Comments: Pt becoming quickly frustrated. Also noting pt with poor awareness of deficit's impact on performance and limiting behaviors. Pt requiringi ncreased time for  processing and to follow commands. Pt benefits greatly from calm cues and environment to increase attention to following of commands. Perseverating on topics of yesterday's session as well as pain. Cues for relaxation and calm breathing        Exercises Exercises: Other exercises Other Exercises Other Exercises: weight shifting RLE<>LLE while standing at EOB.  Other Exercises: Marching in standing; 5 steps   Shoulder Instructions       General Comments SpO2 90s on 6L via [place]. Facilitated conversation with wife on concussion/TBI symptoms as well as ways to address these symptoms. Wife very thanksful for information.     Pertinent Vitals/ Pain       Pain Assessment: Faces Faces Pain Scale: Hurts even more Pain Location: left chest,  lt side Pain Descriptors / Indicators: Grimacing;Guarding;Sharp;Stabbing Pain Intervention(s): Limited activity within patient's tolerance;Monitored during session;Repositioned  Home Living                                          Prior Functioning/Environment              Frequency  Min 2X/week        Progress Toward Goals  OT Goals(current goals can now be found in the care plan section)  Progress towards OT goals: Not progressing toward goals - comment(Pt limited by pain, decreased awareness, and O2 requirement)  Acute Rehab OT Goals Patient Stated Goal: ease pain; get home to see his dog "Bear" OT Goal Formulation: With patient Time For Goal Achievement: 04/24/19 Potential to Achieve Goals: Good ADL Goals Pt Will Perform Grooming: standing;with supervision Pt Will Perform Upper Body Bathing: with set-up;sitting Pt Will Perform Lower Body Bathing: sit to/from stand;with supervision;with adaptive equipment Pt Will Perform Upper Body Dressing: with set-up;sitting Pt Will Perform Lower Body Dressing: with supervision;sit to/from stand;with adaptive equipment Pt Will Transfer to Toilet: with  supervision;ambulating;regular height toilet;bedside commode;grab bars Pt Will Perform Toileting - Clothing Manipulation and hygiene: with supervision;sit to/from stand;with adaptive equipment Pt Will Perform Tub/Shower Transfer: Tub transfer;with min guard assist;ambulating;shower seat;3 in 1  Plan Discharge plan needs to be updated    Co-evaluation    PT/OT/SLP Co-Evaluation/Treatment: Yes            AM-PAC OT "6 Clicks" Daily Activity     Outcome Measure   Help from another person eating meals?: None Help from another person taking care of personal grooming?: A Little Help from another person toileting, which includes using toliet, bedpan, or urinal?: A Lot Help from another person bathing (including washing, rinsing, drying)?: A Lot Help from another person to put on and taking off regular upper body clothing?: A Lot Help from another person to put on and taking off regular lower body clothing?: Total 6 Click Score: 14    End of Session Equipment Utilized During Treatment: Oxygen(5-6L)  OT Visit Diagnosis: Unsteadiness on feet (R26.81);Other abnormalities of gait and mobility (R26.89);Muscle weakness (generalized) (M62.81);Pain Pain - Right/Left: Left Pain - part of body: (Ribs)  Activity Tolerance Patient tolerated treatment well   Patient Left in bed;with nursing/sitter in room;with family/visitor present;with call bell/phone within reach   Nurse Communication Mobility status;Patient requests pain meds        Time: 3748-2707 OT Time Calculation (min): 50 min  Charges: OT General Charges $OT Visit: 1 Visit OT Treatments $Self Care/Home Management : 38-52 mins  [place], OTR/L Acute Rehab Pager: [phone] Office: [place] 04/14/2019, 5:52 PM

## 2019-04-14 NOTE — Progress Notes (Signed)
Physical Therapy Treatment Patient Details Name: Dale Perez MRN: [id number] DOB: Jan 16, 1956 Today's Date: 04/14/2019    History of Present Illness 64 y.o. male admitted after 39ft fall from ladder while cutting tree lims.  He was + for brief LOC.  He was found to have:  Lt sided rib fractures 2-12 with tiny pneumothorax., concussion - head CT negative for acute abnormalit GCS 15.   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 04/11/19 episode of HR up to 150 at rest. Required incr to 10L venturi mask.  PMH includes: s/p shoulder athroscopy.     PT Comments    On arrival, pt on room air with finger probe on his forehead (peeling off) and reading 92%. Patient with worse mentation than seen previously and replaced probe with neonate probe to left index finger with good waveform reading 82% saturation. Re-applied Fontanet O2 at 3L with sats increasing slowly to 92%. Increased time to convince pt he must get OOB today--pt resisting due to increased left rib pain since PT session yesterday. Pt described feeling "something pop" in left chest and has been miserable since. Dr. Janee Morn entered room and explained to pt normal to feel ribs move for several weeks. Ultimately, pt slowly moved to sit EOB with moderate assistance with sats decreasing to 86% on 3L. Patient able to follow instructions for pursed lip breathing, however sats remained 85-86% and increased to 4L and then 6L  O2 with sats reaching 88%. Stood for several minutes before and after stepping to recliner with sats decreasing to 85% on 6L. After several minutes sitting in chair, sats continued 86-87% on 6L and RN in to assess patient and placed pt on partial NRB at 10L. Sats slowly increased to 90%. Discussed wife's concerns re: pt discharging home in current state. Discussed possibility of post-acute therapies and pt/wife agreed to pursue potential CIR admission. Trauma PA in to talk to pt/wife and agreed to order Rehab Consult. Updated OT re: PT's  recommendation and OT plans to see pt later today.     Follow Up Recommendations  CIR;Supervision/Assistance - 24 hour     Equipment Recommendations  None recommended by PT    Recommendations for Other Services Rehab consult     Precautions / Restrictions Precautions Precautions: Fall Precaution Comments: watch sats Restrictions Weight Bearing Restrictions: No    Mobility  Bed Mobility Overal bed mobility: Needs Assistance Bed Mobility: Supine to Sit     Supine to sit: Mod assist;HOB elevated     General bed mobility comments: exit bed to right with HOB fully elevated (pt plans to sleep in recliner at home, therefore did not practice logroll, side to sit) and pt splinting pillow to his left side with LUE; incr time with pt moving his legs off rt side of bed, grasping edge of bed frame and pivoting on rt hip to come to sit EOB; mod assist to raise from propped on rt elbow up to full sitting  Transfers Overall transfer level: Needs assistance Equipment used: 1 person hand held assist Transfers: Sit to/from [employer] Transfers Sit to Stand: Min assist(from EOB fully lowered) Stand pivot transfers: Min assist       General transfer comment: assist to power up to stand; did not use RW as pt splinting left side with pillow using LUE; incr time and very small steps to pivot to chair  Ambulation/Gait Ambulation/Gait assistance: Min assist Gait Distance (Feet): 3 Feet Assistive device: 1 person hand held assist Gait Pattern/deviations:  Step-to pattern;Shuffle;Decreased stride length     General Gait Details: pt stood ~2 minutes prior to agreeing to take steps to chair; stood ~3 minutes working on breathing and taking small steps forward and backward   Social research officer, [employer] (Stroke Patients Only)       Balance Overall balance assessment: Needs assistance Sitting-balance support: Feet supported;Single extremity  supported Sitting balance-Leahy Scale: Poor Sitting balance - Comments: pt could not tolerated sitting without use of RUE Postural control: Right lateral lean Standing balance support: During functional activity;Single extremity supported Standing balance-Leahy Scale: Poor Standing balance comment: shifted to his rt with +1 HHA on rt                            Cognition Arousal/Alertness: Awake/alert Behavior During Therapy: Anxious Overall Cognitive Status: Impaired/Different from baseline Area of Impairment: Following commands;Problem solving;Awareness;Memory;Attention;Rancho level               Rancho Levels of Cognitive Functioning Ranch[place] Mirant Scales of Cognitive Functioning: Automatic/appropriate   Current Attention Level: Selective Memory: Decreased short-term memory Following Commands: Follows one step commands with increased time;Follows multi-step commands inconsistently;Follows multi-step commands with increased time(internally distracted by pain)   Awareness: Emergent Problem Solving: Slow processing;Requires verbal cues;Decreased initiation;Difficulty sequencing;Requires tactile cues General Comments: incr time to process and follow commands; very focused on pain (to point of internally distracrted)       Exercises      General Comments General comments (skin integrity, edema, etc.): increase time educating pt/wife on benefits of activity as pt initially refusing due to pain despite premedication for pain (wife very supportive, pt reluctant due to "what happened in PT yesterday" with significant incr in left rib pain); educated on change in technique for getting OOB today (allow HOB elevated and use of rail as he will not sleep in bed at home and plans to use recliner), allowing pt to do as much for himself as possible (not pulling on him); educated on importance of IS use; RN in toward end of session due to desaturation with mobility; discussed  ?rehab stay to assist with pain management and more therapy/moving bettter prior to discharge home       Pertinent Vitals/Pain Pain Assessment: 0-10 Pain Score: 10-Worst pain ever Pain Location: left chest,  lt side Pain Descriptors / Indicators: Grimacing;Guarding;Sharp;Stabbing Pain Intervention(s): Limited activity within patient's tolerance;Monitored during session;Premedicated before session;Repositioned;Utilized relaxation techniques    Home Living                      Prior Function            PT Goals (current goals can now be found in the care plan section) Acute Rehab PT Goals Patient Stated Goal: ease pain; get home to see his dog "Bear" Time For Goal Achievement: 04/24/19 Potential to Achieve Goals: Good Progress towards PT goals: Not progressing toward goals - comment(incr left chest pain, incr O2 requirements)    Frequency    Min 5X/week      PT Plan Discharge plan needs to be updated    Co-evaluation              AM-PAC PT "6 Clicks" Mobility   Outcome Measure  Help needed turning from your back to your side while in a flat bed without using bedrails?: A  Lot Help needed moving from lying on your back to sitting on the side of a flat bed without using bedrails?: A Lot Help needed moving to and from a bed to a chair (including a wheelchair)?: A Little Help needed standing up from a chair using your arms (e.g., wheelchair or bedside chair)?: A Little Help needed to walk in hospital room?: A Lot Help needed climbing 3-5 steps with a railing? : Total 6 Click Score: 13    End of Session Equipment Utilized During Treatment: Oxygen Activity Tolerance: Patient limited by pain;Treatment limited secondary to medical complications (Comment)(desaturation) Patient left: with call bell/phone within reach;with family/visitor present;in chair;with nursing/sitter in room Nurse Communication: Mobility status;Other (comment)(decr sats; RN in to assess and  apply partial NRB) PT Visit Diagnosis: Difficulty in walking, not elsewhere classified (R26.2);Pain Pain - Right/Left: Left Pain - part of body: (chest)     Time: 6283-1517 PT Time Calculation (min) (ACUTE ONLY): 93 min  Charges:  $Gait Training: 8-22 mins $Therapeutic Activity: 23-37 mins $Self Care/Home Management: 38-52                      Arby Barrette, PT Pager [phone]    Rexanne Mano 04/14/2019, 1:37 PM

## 2019-04-15 MED ORDER — SIMETHICONE 80 MG PO CHEW
80.0000 mg | CHEWABLE_TABLET | Freq: Four times a day (QID) | ORAL | Status: DC | PRN
  Administered 2019-04-15: 80 mg via ORAL
  Filled 2019-04-15: qty 1

## 2019-04-15 MED ORDER — POLYETHYLENE GLYCOL 3350 17 G PO PACK
17.0000 g | PACK | Freq: Every day | ORAL | Status: DC
  Administered 2019-04-15 – 2019-04-18 (×4): 17 g via ORAL
  Filled 2019-04-15 (×3): qty 1

## 2019-04-15 MED ORDER — BISACODYL 10 MG RE SUPP
10.0000 mg | Freq: Every day | RECTAL | Status: DC | PRN
  Administered 2019-04-16: 10 mg via RECTAL
  Filled 2019-04-15: qty 1

## 2019-04-15 MED ORDER — FUROSEMIDE 10 MG/ML IJ SOLN
40.0000 mg | Freq: Once | INTRAMUSCULAR | Status: AC
  Administered 2019-04-15: 40 mg via INTRAVENOUS
  Filled 2019-04-15: qty 4

## 2019-04-15 MED ORDER — DOCUSATE SODIUM 100 MG PO CAPS
100.0000 mg | ORAL_CAPSULE | Freq: Two times a day (BID) | ORAL | Status: DC
  Administered 2019-04-15 – 2019-04-18 (×7): 100 mg via ORAL
  Filled 2019-04-15 (×7): qty 1

## 2019-04-15 NOTE — Progress Notes (Addendum)
       Subjective: CC: rib pain Now recommended for CIR. Weaned down to 4L overnight. Currently getting duo neb. Reports pain is about the same, maybe slightly better. Notes pain is mainly in left upper chest when he tries to move. Not much pain if he is sitting still. SOB has improved. Tolerating diet. No BM since 3/6.  Objective: Vital signs in last 24 hours: Temp:  [97.9 F (36.6 C)-99 F (37.2 C)] 98.7 F (37.1 C) (03/12 0745) Pulse Rate:  [70-88] 70 (03/12 0745) Resp:  [15-20] 17 (03/12 0745) BP: (118-156)/(72-88) 118/72 (03/12 0745) SpO2:  [91 %-96 %] 92 % (03/12 0745) Last BM Date: 04/09/19  Intake/Output from previous day: 03/11 0701 - 03/12 0700 In: 580 [P.O.:580] Out: 2500 [Urine:2500] Intake/Output this shift: Total I/O In: -  Out: 200 [Urine:200]  PE: Gen:  Alert, NAD, pleasant HEENT: EOM's intact, pupils equal and round Card:  RRR Pulm: Rales at bases. Getting duo-neb. Left chest wall tenderness. Abd: Soft, NT/ND, +BS Ext:  No LE edema  Back: no T spine tenderness Psych: A&Ox3  Skin: no rashes noted, warm and dry  Lab Results:  No results for input(s): WBC, HGB, HCT, PLT in the last 72 hours. BMET Recent Labs    04/13/19 0525  NA 134*  K 3.7  CL 96*  CO2 28  GLUCOSE 105*  BUN 13  CREATININE 0.83  CALCIUM 8.5*   PT/INR No results for input(s): LABPROT, INR in the last 72 hours. CMP     Component Value Date/Time   NA 134 (L) 04/13/2019 0525   K 3.7 04/13/2019 0525   CL 96 (L) 04/13/2019 0525   CO2 28 04/13/2019 0525   GLUCOSE 105 (H) 04/13/2019 0525   BUN 13 04/13/2019 0525   CREATININE 0.83 04/13/2019 0525   CALCIUM 8.5 (L) 04/13/2019 0525   PROT 6.8 04/09/2019 1645   ALBUMIN 4.0 04/09/2019 1645   AST 27 04/09/2019 1645   ALT 26 04/09/2019 1645   ALKPHOS 104 04/09/2019 1645   BILITOT 0.4 04/09/2019 1645   GFRNONAA >60 04/13/2019 0525   GFRAA >60 04/13/2019 0525   Lipase  No results found for: LIPASE     Studies/Results: No  results found.  Anti-infectives: Anti-infectives (From admission, onward)   Start     Dose/Rate Route Frequency Ordered Stop   04/13/19 1000  cefdinir (OMNICEF) capsule 300 mg     300 mg Oral Every 12 hours 04/13/19 0929     04/11/19 1000  levofloxacin (LEVAQUIN) tablet 750 mg  Status:  Discontinued     750 mg Oral Daily 04/11/19 0839 04/13/19 0915       Assessment/Plan 73M s/p Fall from ladder Concussion - f/u concussion clinic. PT/OT Left rib fractures 2 -12 with occult pneumothorax -pulm toilet (flutter valve, IS), scheduled duoneb, lasix. Check K in the AM.  Abrasion posterior left lower extremity- local wound care Incidental finding of4.4cm AAA- f/u as outpt with Dr. Dietrich Pates Long standing tobacco abuse - Nicotine patch  Possible subcortical impaction fracture of T8 - Noted on CT on arrival. Patient denies pain to this area. No TTP A fib- Episode 3/10, resolved after lopressor ID- PNA, Strep PNA on CX's. Cefdinir per Pharm FEN- Reg, nicotine patch VTE- SCDs, Lovenox Dispo- CIR   LOS: 6 days    Jacinto Halim , [facility] 04/15/2019, 9:42 AM Please see Amion for pager number during day hours 7:00am-4:30pm

## 2019-04-15 NOTE — Progress Notes (Addendum)
Patient's wife expressed to RN that the patient seems more lethargic than usual today, saying that he is whispering to speak and less vocal. When questioned about pain, the patient expressed that his left-sided rib pain comes intermittently as a sharp, stabbing feeling rated a 12/10 as opposed to its constant 6/10 at baseline. RN observed the patient grimace and wince sudddenly in pain twice in a 15 minute time period. The patient also complained of gas pain; RN noted a distended and tender abdomen. PRN medicine administered for pain and gas. The patient is sitting in bed getting a breathing treatment now.    2120: RN observed some light beads of sweat on patient's forehead. Patient stated he has mild sweats intermittently. Temperature WNL and wet rag placed on forehead with fan at bedside.

## 2019-04-15 NOTE — Progress Notes (Signed)
Physical Therapy Treatment Patient Details Name: Dale Perez MRN: [id number] DOB: January 10, 1956 Today's Date: 04/15/2019    History of Present Illness 64 y.o. male admitted after 55ft fall from ladder while cutting tree lims.  He was + for brief LOC.  He was found to have:  Lt sided rib fractures 2-12 with tiny pneumothorax., concussion - head CT negative for acute abnormalit GCS 15.   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 04/11/19 episode of HR up to 150 at rest. Required incr to 10L venturi mask.  PMH includes: s/p shoulder athroscopy.     PT Comments    Pt continues slow progress with mobility due to pain. Mod assist bed mobility, min assist sit to stand with RW and min assist ambulation 10' with RW. Mobilized on 2L with SpO2 90-92%. Increased time required to complete all mobility skills. Pt in recliner at end of session with wife present in room.    Follow Up Recommendations  CIR;Supervision/Assistance - 24 hour     Equipment Recommendations  None recommended by PT    Recommendations for Other Services       Precautions / Restrictions Precautions Precautions: Fall;Other (comment) Precaution Comments: watch sats    Mobility  Bed Mobility Overal bed mobility: Needs Assistance Bed Mobility: Supine to Sit     Supine to sit: Mod assist;HOB elevated     General bed mobility comments: +rail, increased time, continual cues to continue to move through pain  Transfers Overall transfer level: Needs assistance Equipment used: Rolling walker (2 wheeled) Transfers: Sit to/from Stand Sit to Stand: From elevated surface;Min assist         General transfer comment: min assist to power up from elevated bed, increased time to stabilize initial standing balance  Ambulation/Gait Ambulation/Gait assistance: Min assist Gait Distance (Feet): 10 Feet Assistive device: Rolling walker (2 wheeled) Gait Pattern/deviations: Step-to pattern;Decreased stride length;Shuffle Gait  velocity: very slow, disctracted by pain Gait velocity interpretation: <1.8 ft/sec, indicate of risk for recurrent falls General Gait Details: ambulated in room with RW on 2L O2 with SpO2 90-92%.   Stairs             Wheelchair Mobility    Modified Rankin (Stroke Patients Only)       Balance Overall balance assessment: Needs assistance Sitting-balance support: Feet supported;Single extremity supported Sitting balance-Leahy Scale: Fair     Standing balance support: Bilateral upper extremity supported;During functional activity;No upper extremity supported Standing balance-Leahy Scale: Fair Standing balance comment: Able to maintain static standing without UE support                            Cognition Arousal/Alertness: Awake/alert Behavior During Therapy: Anxious Overall Cognitive Status: Impaired/Different from baseline Area of Impairment: Following commands;Problem solving;Awareness;Memory;Attention;Rancho level                   Current Attention Level: Selective Memory: Decreased short-term memory Following Commands: Follows one step commands with increased time;Follows multi-step commands inconsistently;Follows multi-step commands with increased time(internally distracted by pain)   Awareness: Emergent Problem Solving: Slow processing;Requires verbal cues;Decreased initiation;Difficulty sequencing;Requires tactile cues General Comments: Easily frustrated. Perseverating on pain. Continual cues to stay on task and move through pain.      Exercises      General Comments General comments (skin integrity, edema, etc.): Mobilized on 2L with SpO2 90-92%.      Pertinent Vitals/Pain Pain Assessment: Faces Faces Pain Scale: Hurts  whole lot Pain Location: L flank Pain Descriptors / Indicators: Grimacing;Guarding;Sharp;Stabbing Pain Intervention(s): Repositioned;Limited activity within patient's tolerance;Monitored during session    Home  Living                      Prior Function            PT Goals (current goals can now be found in the care plan section) Acute Rehab PT Goals Patient Stated Goal: decrease pain Progress towards PT goals: Progressing toward goals    Frequency    Min 5X/week      PT Plan Current plan remains appropriate    Co-evaluation              AM-PAC PT "6 Clicks" Mobility   Outcome Measure  Help needed turning from your back to your side while in a flat bed without using bedrails?: A Lot Help needed moving from lying on your back to sitting on the side of a flat bed without using bedrails?: A Lot Help needed moving to and from a bed to a chair (including a wheelchair)?: A Little Help needed standing up from a chair using your arms (e.g., wheelchair or bedside chair)?: A Little Help needed to walk in hospital room?: A Little Help needed climbing 3-5 steps with a railing? : Total 6 Click Score: 14    End of Session Equipment Utilized During Treatment: Gait belt;Oxygen Activity Tolerance: Patient limited by pain Patient left: in chair;with call bell/phone within reach;with family/visitor present;with chair alarm set Nurse Communication: Mobility status PT Visit Diagnosis: Difficulty in walking, not elsewhere classified (R26.2);Pain     Time: 3716-9678 PT Time Calculation (min) (ACUTE ONLY): 38 min  Charges:  $Gait Training: 23-37 mins $Therapeutic Activity: 8-22 mins                     Lorrin Goodell, PT  Office # [phone] Pager [phone]    Lorriane Shire 04/15/2019, 1:36 PM

## 2019-04-15 NOTE — Progress Notes (Signed)
Inpatient Rehab Admissions Coordinator:   Opened insurance for authorization.  Will follow for timing of possible admission pending approval.   Estill Dooms, PT, DPT Admissions Coordinator [phone] 04/15/19  1:50 PM

## 2019-04-15 NOTE — Discharge Instructions (Signed)
RIB FRACTURES  HOME INSTRUCTIONS   1. PAIN CONTROL:  1. Pain is best controlled by a usual combination of three different methods TOGETHER:  i. Ice/Heat ii. Over the counter pain medication iii. Prescription pain medication 2. You may experience some swelling and bruising in area of broken ribs. Ice packs or heating pads (30-60 minutes up to 6 times a day) will help. Use ice for the first few days to help decrease swelling and bruising, then switch to heat to help relax tight/sore spots and speed recovery. Some people prefer to use ice alone, heat alone, alternating between ice & heat. Experiment to what works for you. Swelling and bruising can take several weeks to resolve.  3. It is helpful to take an over-the-counter pain medication regularly for the first few weeks. Choose one of the following that works best for you:  i. Naproxen (Aleve, etc) Two 220mg tabs twice a day ii. Ibuprofen (Advil, etc) Three 200mg tabs four times a day (every meal & bedtime) iii. Acetaminophen (Tylenol, etc) 500-650mg four times a day (every meal & bedtime) 4. A prescription for pain medication (such as oxycodone, hydrocodone, etc) may be given to you upon discharge. Take your pain medication as prescribed.  i. If you are having problems/concerns with the prescription medicine (does not control pain, nausea, vomiting, rash, itching, etc), please call us [phone] to see if we need to switch you to a different pain medicine that will work better for you and/or control your side effect better. ii. If you need a refill on your pain medication, please contact your pharmacy. They will contact our office to request authorization. Prescriptions will not be filled after 5 pm or on week-ends. 1. Avoid getting constipated. When taking pain medications, it is common to experience some constipation. Increasing fluid intake and taking a fiber supplement (such as Metamucil, Citrucel, FiberCon, MiraLax, etc) 1-2 times a day  regularly will usually help prevent this problem from occurring. A mild laxative (prune juice, Milk of Magnesia, MiraLax, etc) should be taken according to package directions if there are no bowel movements after 48 hours.  2. Watch out for diarrhea. If you have many loose bowel movements, simplify your diet to bland foods & liquids for a few days. Stop any stool softeners and decrease your fiber supplement. Switching to mild anti-diarrheal medications (Kayopectate, Pepto Bismol) can help. If this worsens or does not improve, please call us. 3. FOLLOW UP  a. If a follow up appointment is needed one will be scheduled for you. If none is needed with our trauma team, please follow up with your primary care provider within 2-3 weeks from discharge. Please call [facility] at [phone] if you have any questions about follow up.  b. If you have any orthopedic or other injuries you will need to follow up as outlined in your follow up instructions.   WHEN TO CALL US [phone]:  1. Poor pain control 2. Reactions / problems with new medications (rash/itching, nausea, etc)  3. Fever over 101.5 F (38.5 C) 4. Worsening swelling or bruising 5. Worsening pain, productive cough, difficulty breathing or any other concerning symptoms  The clinic staff is available to answer your questions during regular business hours (8:30am-5pm). Please don't hesitate to call and ask to speak to one of our nurses for clinical concerns.  If you have a medical emergency, go to the nearest emergency room or call 911.  A surgeon from [facility] is always on call   at the hospitals   [facility]  [address], [address], [place], [place] [address] ?  MAIN: [phone] ? TOLL FREE: [phone] ?  FAX [phone]  [email or web address]      Information on Rib Fractures  A rib fracture is a break or crack in one of the bones of the ribs. The ribs are long, curved bones that  wrap around your chest and attach to your spine and your breastbone. The ribs protect your heart, lungs, and other organs in the chest. A broken or cracked rib is often painful but is not usually serious. Most rib fractures heal on their own over time. However, rib fractures can be more serious if multiple ribs are broken or if broken ribs move out of place and push against other structures or organs. What are the causes? This condition is caused by:  Repetitive movements with high force, such as pitching a baseball or having severe coughing spells.  A direct blow to the chest, such as a sports injury, a car accident, or a fall.  Cancer that has spread to the bones, which can weaken bones and cause them to break. What are the signs or symptoms? Symptoms of this condition include:  Pain when you breathe in or cough.  Pain when someone presses on the injured area.  Feeling short of breath. How is this diagnosed? This condition is diagnosed with a physical exam and medical history. Imaging tests may also be done, such as:  Chest X-ray.  CT scan.  MRI.  Bone scan.  Chest ultrasound. How is this treated? Treatment for this condition depends on the severity of the fracture. Most rib fractures usually heal on their own in 1-3 months. Sometimes healing takes longer if there is a cough that does not stop or if there are other activities that make the injury worse (aggravating factors). While you heal, you will be given medicines to control the pain. You will also be taught deep breathing exercises. Severe injuries may require hospitalization or surgery. Follow these instructions at home: Managing pain, stiffness, and swelling  If directed, apply ice to the injured area. ? Put ice in a plastic bag. ? Place a towel between your skin and the bag. ? Leave the ice on for 20 minutes, 2-3 times a day.  Take over-the-counter and prescription medicines only as told by your health care  provider. Activity  Avoid a lot of activity and any activities or movements that cause pain. Be careful during activities and avoid bumping the injured rib.  Slowly increase your activity as told by your health care provider. General instructions  Do deep breathing exercises as told by your health care provider. This helps prevent pneumonia, which is a common complication of a broken rib. Your health care provider may instruct you to: ? Take deep breaths several times a day. ? Try to cough several times a day, holding a pillow against the injured area. ? Use a device called incentive spirometer to practice deep breathing several times a day.  Drink enough fluid to keep your urine pale yellow.  Do not wear a rib belt or binder. These restrict breathing, which can lead to pneumonia.  Keep all follow-up visits as told by your health care provider. This is important. Contact a health care provider if:  You have a fever. Get help right away if:  You have difficulty breathing or you are short of breath.  You develop a cough that does   not stop, or you cough up thick or bloody sputum.  You have nausea, vomiting, or pain in your abdomen.  Your pain gets worse and medicine does not help. Summary  A rib fracture is a break or crack in one of the bones of the ribs.  A broken or cracked rib is often painful but is not usually serious.  Most rib fractures heal on their own over time.  Treatment for this condition depends on the severity of the fracture.  Avoid a lot of activity and any activities or movements that cause pain. This information is not intended to replace advice given to you by your health care provider. Make sure you discuss any questions you have with your health care provider. Document Released: 01/20/2005 Document Revised: 04/21/2016 Document Reviewed: 04/21/2016 Elsevier Interactive Patient Education  2019 Elsevier Inc.   Concussion, Adult  A concussion is a  brain injury from a hard, direct hit (trauma) to the head or body. This direct hit causes the brain to shake quickly back and forth inside the skull. This can damage brain cells and cause chemical changes in the brain. A concussion may also be known as a mild traumatic brain injury (TBI). Concussions are usually not life-threatening, but the effects of a concussion can be serious. If you have a concussion, you should be very careful to avoid having a second concussion. What are the causes? This condition is caused by:  A direct hit to your head, such as: ? Running into another player during a game. ? Being hit in a fight. ? Hitting your head on a hard surface.  Sudden movement of your body that causes your brain to move back and forth inside the skull, such as in a car crash. What are the signs or symptoms? The signs of a concussion can be hard to notice. Early on, they may be missed by you, family members, and health care providers. You may look fine on the outside but may act or feel differently. Symptoms are usually temporary and most often improve in 7-10 days. Some symptoms appear right away, but other symptoms may not show up for hours or days. If your symptoms last longer than normal, you may have post-concussion syndrome. Every head injury is different. Physical symptoms  Headaches. This can include a feeling of pressure in the head or migraine-like symptoms.  Tiredness (fatigue).  Dizziness.  Problems with coordination or balance.  Vision or hearing problems.  Sensitivity to light or noise.  Nausea or vomiting.  Changes in eating or sleeping patterns.  Numbness or tingling.  Seizure. Mental and emotional symptoms  Memory problems.  Trouble concentrating, organizing, or making decisions.  Slowness in thinking, acting or reacting, speaking, or reading.  Irritability or mood changes.  Anxiety or depression. How is this diagnosed? This condition is diagnosed based  on:  Your symptoms.  A description of your injury. You may also have tests, including:  Imaging tests, such as a CT scan or MRI.  Neuropsychological tests. These measure your thinking, understanding, learning, and remembering abilities. How is this treated? Treatment for this condition includes:  Stopping sports or activity if you are injured. If you hit your head or show signs of concussion: ? Do not return to sports or activities the same day. ? Get checked by a health care provider before you return to your activities.  Physical and mental rest and careful observation, usually at home. Gradually return to your normal activities.  Medicines to help with   symptoms such as headaches, nausea, or difficulty sleeping. ? Avoid taking opioid pain medicine while recovering from a concussion.  Avoiding alcohol and drugs. These may slow your recovery and can put you at risk of further injury.  Referral to a concussion clinic or rehabilitation center. Recovery from a concussion can take time. How fast you recover depends on many factors. Return to activities only when:  Your symptoms are completely gone.  Your health care provider says that it is safe. Follow these instructions at home: Activity  Limit activities that require a lot of thought or concentration, such as: ? Doing homework or job-related work. ? Watching TV. ? Working on the computer or phone. ? Playing memory games and puzzles.  Rest. Rest helps your brain heal. Make sure you: ? Get plenty of sleep. Most adults should get 7-9 hours of sleep each night. ? Rest during the day. Take naps or rest breaks when you feel tired.  Avoid physical activity like exercise until your health care provider says it is safe. Stop any activity that worsens symptoms.  Do not do high-risk activities that could cause a second concussion, such as riding a bike or playing sports.  Ask your health care provider when you can return to your  normal activities, such as school, work, athletics, and driving. Your ability to react may be slower after a brain injury. Never do these activities if you are dizzy. Your health care provider will likely give you a plan for gradually returning to activities. General instructions   Take over-the-counter and prescription medicines only as told by your health care provider. Some medicines, such as blood thinners (anticoagulants) and aspirin, may increase the risk for complications, such as bleeding.  Do not drink alcohol until your health care provider says you can.  Watch your symptoms and tell others around you to do the same. Complications sometimes occur after a concussion. Older adults with a brain injury may have a higher risk of serious complications.  Tell your work manager, teachers, school nurse, school counselor, coach, or athletic trainer about your injury, symptoms, and restrictions.  Keep all follow-up visits as told by your health care provider. This is important. How is this prevented? Avoiding another brain injury is very important. In rare cases, another injury can lead to permanent brain damage, brain swelling, or death. The risk of this is greatest during the first 7-10 days after a head injury. Avoid injuries by:  Stopping activities that could lead to a second concussion, such as contact or recreational sports, until your health care provider says it is okay.  Taking these actions once you have returned to sports or activities: ? Avoiding plays or moves that can cause you to crash into another person. This is how most concussions occur. ? Following the rules and being respectful of other players. Do not engage in violent or illegal plays.  Getting regular exercise that includes strength and balance training.  Wearing a properly fitting helmet during sports, biking, or other activities. Helmets can help protect you from serious skull and brain injuries, but they do not  protect you from a concussion. Even when wearing a helmet, you should avoid being hit in the head. Contact a health care provider if:  Your symptoms get worse or they do not improve.  You have new symptoms.  You have another injury. Get help right away if:  You have severe or worsening headaches.  You have weakness or numbness in any part of your   body.  You are confused.  Your coordination gets worse.  You vomit repeatedly.  You are sleepier than normal.  Your speech is slurred.  You cannot recognize people or places.  You have a seizure.  It is difficult to wake you up.  You have unusual behavior changes.  You have changes in your vision.  You lose consciousness. Summary  A concussion is a brain injury that results from a hard, direct hit (trauma) to your head or body.  You may have imaging tests and neuropsychological tests to diagnose a concussion.  Treatment for this condition includes physical and mental rest and careful observation.  Ask your health care provider when you can return to your normal activities, such as school, work, athletics, and driving.  Get help right away if you have a severe headache, weakness on one side of the body, seizures, behavior changes, changes in vision, or if you are confused or sleepier than normal. This information is not intended to replace advice given to you by your health care provider. Make sure you discuss any questions you have with your health care provider. Document Revised: 09/10/2017 Document Reviewed: 09/10/2017 Elsevier Patient Education  2020 Elsevier Inc.  

## 2019-04-16 LAB — BASIC METABOLIC PANEL
Anion gap: 12 (ref 5–15)
BUN: 21 mg/dL (ref 8–23)
CO2: 29 mmol/L (ref 22–32)
Calcium: 8.9 mg/dL (ref 8.9–10.3)
Chloride: 92 mmol/L — ABNORMAL LOW (ref 98–111)
Creatinine, Ser: 0.68 mg/dL (ref 0.61–1.24)
GFR calc Af Amer: >60 mL/min (ref >60)
GFR calc non Af Amer: >60 mL/min (ref >60)
Glucose, Bld: 98 mg/dL (ref 70–99)
Potassium: 3.9 mmol/L (ref 3.5–5.1)
Sodium: 133 mmol/L — ABNORMAL LOW (ref 135–145)

## 2019-04-16 LAB — CBC
HCT: 42.3 % (ref 39.0–52.0)
Hemoglobin: 13.9 g/dL (ref 13.0–17.0)
MCH: 28.3 pg (ref 26.0–34.0)
MCHC: 32.9 g/dL (ref 30.0–36.0)
MCV: 86.2 fL (ref 80.0–100.0)
Platelets: 332 10*3/uL (ref 150–400)
RBC: 4.91 MIL/uL (ref 4.22–5.81)
RDW: 14 % (ref 11.5–15.5)
WBC: 8.1 10*3/uL (ref 4.0–10.5)
nRBC: 0 % (ref 0.0–0.2)

## 2019-04-16 MED ORDER — IPRATROPIUM-ALBUTEROL 0.5-2.5 (3) MG/3ML IN SOLN: 3.0000 mL | Freq: Four times a day (QID) | RESPIRATORY_TRACT | Status: DC | PRN

## 2019-04-16 MED ORDER — IPRATROPIUM-ALBUTEROL 0.5-2.5 (3) MG/3ML IN SOLN
3.0000 mL | Freq: Two times a day (BID) | RESPIRATORY_TRACT | Status: DC
  Administered 2019-04-16 – 2019-04-17 (×3): 3 mL via RESPIRATORY_TRACT
  Filled 2019-04-16 (×3): qty 3

## 2019-04-16 MED ORDER — IPRATROPIUM-ALBUTEROL 0.5-2.5 (3) MG/3ML IN SOLN: 3.0000 mL | Freq: Four times a day (QID) | RESPIRATORY_TRACT | Status: DC

## 2019-04-16 NOTE — Progress Notes (Signed)
   Subjective/Chief Complaint: On oxygen still, sleeping this am, pain better overnight   Objective: Vital signs in last 24 hours: Temp:  [97.8 F (36.6 C)-98.6 F (37 C)] 98.5 F (36.9 C) (03/13 0756) Pulse Rate:  [65-83] 65 (03/13 0756) Resp:  [12-23] 12 (03/13 0300) BP: (129-147)/(79-114) 143/85 (03/13 0300) SpO2:  [90 %-94 %] 93 % (03/13 0756) Last BM Date: 04/09/19  Intake/Output from previous day: 03/12 0701 - 03/13 0700 In: 200 [P.O.:200] Out: 475 [Urine:475] Intake/Output this shift: No intake/output data recorded.  Gen: Alert, NAD, sleepy HEENT: EOM's intact, pupils equal and round CV: RRR Pulm:decreased bilateral bases Left chest wall tenderness. Abd: Soft, NT/ND, +BS Ext: No LE edema Psych: A&Ox3  Skin: no rashes noted, warm and dry  Lab Results:  Recent Labs    04/16/19 0627  WBC 8.1  HGB 13.9  HCT 42.3  PLT 332   BMET Recent Labs    04/16/19 0627  NA 133*  K 3.9  CL 92*  CO2 29  GLUCOSE 98  BUN 21  CREATININE 0.68  CALCIUM 8.9   PT/INR No results for input(s): LABPROT, INR in the last 72 hours. ABG No results for input(s): PHART, HCO3 in the last 72 hours.  Invalid input(s): PCO2, PO2  Studies/Results: No results found.  Anti-infectives: Anti-infectives (From admission, onward)   Start     Dose/Rate Route Frequency Ordered Stop   04/13/19 1000  cefdinir (OMNICEF) capsule 300 mg     300 mg Oral Every 12 hours 04/13/19 0929     04/11/19 1000  levofloxacin (LEVAQUIN) tablet 750 mg  Status:  Discontinued     750 mg Oral Daily 04/11/19 0839 04/13/19 0915      Assessment/Plan: 17M s/p Fall from ladder Concussion- f/u concussion clinic. PT/OT Left rib fractures 2 -12 with occult pneumothorax -pulm toilet(flutter valve, IS),scheduled duoneb, lasix. Pain control, pulm toilet Abrasion posterior left lower extremity- local wound care Incidental finding of4.4cm AAA- f/u as outpt with Dr. Dietrich Pates Long standing tobacco  abuse- Nicotine patch  Possiblesubcortical impaction fractureof T8- Noted on CT on arrival. Patient denies pain to this area. No TTP A fib- Episode3/10, resolved after lopressor ID-PNA, Strep PNA on CX's. Cefdinir per Pharm FEN-Reg, nicotine patch VTE-SCDs,Lovenox Dispo- CIR  Emelia Loron 04/16/2019

## 2019-04-16 NOTE — Progress Notes (Signed)
Occupational Therapy Treatment Patient Details Name: Dale Perez MRN: [id number] DOB: January 13, 1956 Today's Date: 04/16/2019    History of present illness 64 y.o. male admitted after 47ft fall from ladder while cutting tree lims.  He was + for brief LOC.  He was found to have:  Lt sided rib fractures 2-12 with tiny pneumothorax., concussion - head CT negative for acute abnormalit GCS 15.   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 04/11/19 episode of HR up to 150 at rest. Required incr to 10L venturi mask.  PMH includes: s/p shoulder athroscopy.    OT comments  Pt progressing towards acute OT goals. Decreased balance, decreased activity tolerance, pain, and impaired cognition continue to impact ADLs. Focus of session was bed mobility, sidesteps and marching in place EOB, then pivoiting to recliner to simulate toilet transfer. Discussed being up to recliner for all meals and using BSC for toileting. D/c plan remains appropriate. Family member present throughout session.    Follow Up Recommendations  CIR;Supervision/Assistance - 24 hour    Equipment Recommendations  Other (comment)(TBD next venue)    Recommendations for Other Services      Precautions / Restrictions Precautions Precautions: Fall;Other (comment) Precaution Comments: watch sats Restrictions Weight Bearing Restrictions: No       Mobility Bed Mobility Overal bed mobility: Needs Assistance Bed Mobility: Supine to Sit     Supine to sit: Mod assist;HOB elevated     General bed mobility comments: cues for sequencing, increased time. utilized bed pad to pivot hips to full EOB position  Transfers Overall transfer level: Needs assistance Equipment used: Rolling walker (2 wheeled) Transfers: Sit to/from Stand Sit to Stand: From elevated surface;Min assist Stand pivot transfers: Min assist       General transfer comment: assist to steady. cues for technique. increased time to complete task    Balance Overall  balance assessment: Needs assistance Sitting-balance support: Feet supported;Single extremity supported Sitting balance-Leahy Scale: Fair     Standing balance support: Bilateral upper extremity supported;During functional activity;No upper extremity supported Standing balance-Leahy Scale: Fair Standing balance comment: Able to maintain static standing without UE support                           ADL either performed or assessed with clinical judgement   ADL Overall ADL's : Needs assistance/impaired                         Toilet Transfer: Minimal assistance;Stand-pivot;RW             General ADL Comments: Pt completed bed mobility, stood EOB and sidestepped and walked in place with encouragement to mobilize despite pain. Pt then pivoted to recliner     Vision       Perception     Praxis      Cognition Arousal/Alertness: Awake/alert Behavior During Therapy: Anxious Overall Cognitive Status: Impaired/Different from baseline Area of Impairment: Following commands;Awareness;Problem solving                   Current Attention Level: Selective Memory: Decreased short-term memory Following Commands: Follows one step commands with increased time;Follows multi-step commands inconsistently;Follows multi-step commands with increased time   Awareness: Emergent Problem Solving: Slow processing;Decreased initiation;Requires verbal cues General Comments: Easily frustrated. Internally distracted. flat affect. anxious. Slow/delayed processing. Needs encouragment to mobilize despite pain.         Exercises Other Exercises Other Exercises:  weight shifting RLE<>LLE while standing at EOB.  Other Exercises: Marching in standing; 5 steps   Shoulder Instructions       General Comments      Pertinent Vitals/ Pain       Pain Assessment: Faces Faces Pain Scale: Hurts whole lot Pain Location: L flank Pain Descriptors / Indicators:  Grimacing;Guarding;Sharp;Sore Pain Intervention(s): Limited activity within patient's tolerance;Monitored during session;Repositioned;Premedicated before session  Home Living                                          Prior Functioning/Environment              Frequency  Min 2X/week        Progress Toward Goals  OT Goals(current goals can now be found in the care plan section)  Progress towards OT goals: Progressing toward goals  Acute Rehab OT Goals Patient Stated Goal: decrease pain OT Goal Formulation: With patient Time For Goal Achievement: 04/24/19 Potential to Achieve Goals: Good ADL Goals Pt Will Perform Grooming: standing;with supervision Pt Will Perform Upper Body Bathing: with set-up;sitting Pt Will Perform Lower Body Bathing: sit to/from stand;with supervision;with adaptive equipment Pt Will Perform Upper Body Dressing: with set-up;sitting Pt Will Perform Lower Body Dressing: with supervision;sit to/from stand;with adaptive equipment Pt Will Transfer to Toilet: with supervision;ambulating;regular height toilet;bedside commode;grab bars Pt Will Perform Toileting - Clothing Manipulation and hygiene: with supervision;sit to/from stand;with adaptive equipment Pt Will Perform Tub/Shower Transfer: Tub transfer;with min guard assist;ambulating;shower seat;3 in 1  Plan Discharge plan remains appropriate    Co-evaluation                 AM-PAC OT "6 Clicks" Daily Activity     Outcome Measure   Help from another person eating meals?: None Help from another person taking care of personal grooming?: A Little Help from another person toileting, which includes using toliet, bedpan, or urinal?: A Lot Help from another person bathing (including washing, rinsing, drying)?: A Lot Help from another person to put on and taking off regular upper body clothing?: A Lot Help from another person to put on and taking off regular lower body clothing?: A  Lot 6 Click Score: 15    End of Session Equipment Utilized During Treatment: Oxygen(4L)  OT Visit Diagnosis: Unsteadiness on feet (R26.81);Other abnormalities of gait and mobility (R26.89);Muscle weakness (generalized) (M62.81);Pain Pain - Right/Left: Left   Activity Tolerance Patient tolerated treatment well;Patient limited by pain   Patient Left in chair;with call bell/phone within reach;with nursing/sitter in room;with family/visitor present   Nurse Communication          Time: 7371-0626 OT Time Calculation (min): 46 min  Charges: OT General Charges $OT Visit: 1 Visit OT Treatments $Self Care/Home Management : 38-52 mins  Raynald Kemp, OT Acute Rehabilitation Services Pager: [phone] Office: [phone]    Pilar Grammes 04/16/2019, 2:51 PM

## 2019-04-16 NOTE — Progress Notes (Addendum)
Physical Therapy Treatment Patient Details Name: Dale Perez MRN: [id number] DOB: July 12, 1955 Today's Date: 04/16/2019    History of Present Illness 64 y.o. male admitted after 58ft fall from ladder while cutting tree lims.  He was + for brief LOC.  He was found to have:  Lt sided rib fractures 2-12 with tiny pneumothorax., concussion - head CT negative for acute abnormalit GCS 15.   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 04/11/19 episode of HR up to 150 at rest. Required incr to 10L venturi mask.  PMH includes: s/p shoulder athroscopy.     PT Comments    Pt progressing well towards their physical therapy goals, although still requiring high amount of oxygen. Ambulating 50 feet with a walker at a min guard assist level. Needs increased level of assist with tasks such as bed mobility and transfers. SpO2 maintaining above 90% on 5L O2. Pain control and management remains an issue in addition to decreased cardiopulmonary endurance. D/c plan remains appropriate.     Follow Up Recommendations  CIR;Supervision/Assistance - 24 hour     Equipment Recommendations  None recommended by PT    Recommendations for Other Services       Precautions / Restrictions Precautions Precautions: Fall;Other (comment) Precaution Comments: watch sats Restrictions Weight Bearing Restrictions: No    Mobility  Bed Mobility Overal bed mobility: Needs Assistance Bed Mobility: Sit to Supine     Supine to sit: Mod assist;HOB elevated     General bed mobility comments: ModA for BLE negotiation back into bed  Transfers Overall transfer level: Needs assistance Equipment used: Rolling walker (2 wheeled) Transfers: Sit to/from Stand Sit to Stand: Min assist Stand pivot transfers: Min assist       General transfer comment: assist to steady. cues for technique. increased time to complete task  Ambulation/Gait Ambulation/Gait assistance: Min guard Gait Distance (Feet): 50 Feet Assistive device:  Rolling walker (2 wheeled) Gait Pattern/deviations: Decreased stride length;Shuffle;Step-through pattern;Trunk flexed     General Gait Details: Min guard for stability, tendency for downward gaze, decreased speed   Stairs             Wheelchair Mobility    Modified Rankin (Stroke Patients Only)       Balance Overall balance assessment: Needs assistance Sitting-balance support: Feet supported;Single extremity supported Sitting balance-Leahy Scale: Fair     Standing balance support: Bilateral upper extremity supported;During functional activity;No upper extremity supported Standing balance-Leahy Scale: Fair Standing balance comment: Able to maintain static standing without UE support                            Cognition Arousal/Alertness: Awake/alert Behavior During Therapy: Anxious Overall Cognitive Status: Impaired/Different from baseline Area of Impairment: Following commands;Awareness;Problem solving                   Current Attention Level: Selective Memory: Decreased short-term memory Following Commands: Follows one step commands with increased time;Follows multi-step commands inconsistently;Follows multi-step commands with increased time   Awareness: Emergent Problem Solving: Slow processing;Decreased initiation;Requires verbal cues General Comments: Easily frustrated. Internally distracted. flat affect. anxious. Slow/delayed processing. Needs encouragment to mobilize despite pain.       Exercises General Exercises - Lower Extremity Long Arc Quad: Both;10 reps;Seated Other Exercises Other Exercises: weight shifting RLE<>LLE while standing at EOB.  Other Exercises: Marching in standing; 5 steps    General Comments        Pertinent Vitals/Pain  Pain Assessment: Faces Faces Pain Scale: Hurts even more Pain Location: L flank Pain Descriptors / Indicators: Grimacing;Guarding;Sharp;Sore Pain Intervention(s): Monitored during session     Home Living                      Prior Function            PT Goals (current goals can now be found in the care plan section) Acute Rehab PT Goals Patient Stated Goal: decrease pain Potential to Achieve Goals: Good Progress towards PT goals: Progressing toward goals    Frequency    Min 5X/week      PT Plan Current plan remains appropriate    Co-evaluation              AM-PAC PT "6 Clicks" Mobility   Outcome Measure  Help needed turning from your back to your side while in a flat bed without using bedrails?: A Lot Help needed moving from lying on your back to sitting on the side of a flat bed without using bedrails?: A Lot Help needed moving to and from a bed to a chair (including a wheelchair)?: A Little Help needed standing up from a chair using your arms (e.g., wheelchair or bedside chair)?: A Little Help needed to walk in hospital room?: A Little Help needed climbing 3-5 steps with a railing? : A Lot 6 Click Score: 15    End of Session Equipment Utilized During Treatment: Gait belt;Oxygen Activity Tolerance: Patient tolerated treatment well Patient left: in bed;with call bell/phone within reach;with bed alarm set;with family/visitor present Nurse Communication: Mobility status PT Visit Diagnosis: Difficulty in walking, not elsewhere classified (R26.2);Pain Pain - Right/Left: Left     Time: 1610-9604 PT Time Calculation (min) (ACUTE ONLY): 27 min  Charges:  $Therapeutic Activity: 23-37 mins                       Lillia Pauls, PT, DPT Acute Rehabilitation Services Pager [phone] Office [phone]    Norval Morton 04/16/2019, 5:37 PM

## 2019-04-16 NOTE — Plan of Care (Signed)

## 2019-04-17 MED ORDER — POLYETHYLENE GLYCOL 3350 17 G PO PACK
17.0000 g | PACK | Freq: Once | ORAL | Status: DC
  Filled 2019-04-17: qty 1

## 2019-04-17 MED ORDER — BISACODYL 5 MG PO TBEC
5.0000 mg | DELAYED_RELEASE_TABLET | Freq: Once | ORAL | Status: AC
  Administered 2019-04-17: 5 mg via ORAL
  Filled 2019-04-17: qty 1

## 2019-04-17 NOTE — Plan of Care (Signed)

## 2019-04-17 NOTE — Progress Notes (Signed)
   Subjective/Chief Complaint: Walked out of room yesterday, feels better today, on 5L [place] this am   Objective: Vital signs in last 24 hours: Temp:  [98.6 F (37 C)-98.7 F (37.1 C)] 98.6 F (37 C) (03/14 0400) Pulse Rate:  [64-70] 64 (03/13 2015) Resp:  [15-22] 15 (03/13 2015) BP: (135-143)/(82-96) 140/96 (03/14 0400) SpO2:  [93 %-97 %] 95 % (03/13 2015) Last BM Date: 04/16/19  Intake/Output from previous day: 03/13 0701 - 03/14 0700 In: -  Out: 900 [Urine:900] Intake/Output this shift: No intake/output data recorded.  Gen: Alert, NAD, much more awake HEENT: EOM's intact, pupils equal and round CV: RRR Pulm:decreased bilateral basesleft chest wall tenderness. Abd: Soft, NT/ND, +BS Ext: No LE edema Psych: A&Ox3  Skin: no rashes noted, warm and dry   Lab Results:  Recent Labs    04/16/19 0627  WBC 8.1  HGB 13.9  HCT 42.3  PLT 332   BMET Recent Labs    04/16/19 0627  NA 133*  K 3.9  CL 92*  CO2 29  GLUCOSE 98  BUN 21  CREATININE 0.68  CALCIUM 8.9   PT/INR No results for input(s): LABPROT, INR in the last 72 hours. ABG No results for input(s): PHART, HCO3 in the last 72 hours.  Invalid input(s): PCO2, PO2  Studies/Results: No results found.  Anti-infectives: Anti-infectives (From admission, onward)   Start     Dose/Rate Route Frequency Ordered Stop   04/13/19 1000  cefdinir (OMNICEF) capsule 300 mg     300 mg Oral Every 12 hours 04/13/19 0929     04/11/19 1000  levofloxacin (LEVAQUIN) tablet 750 mg  Status:  Discontinued     750 mg Oral Daily 04/11/19 0839 04/13/19 0915      Assessment/Plan: 71M s/p Fall from ladder Concussion- f/u concussion clinic. PT/OT Left rib fractures 2 -12 with occult pneumothorax -pulm toilet(flutter valve, IS),scheduled duoneb, lasix. Pain control, pulm toilet. Wean oxygen as tolerated Abrasion posterior left lower extremity- local wound care Incidental finding of4.4cm AAA- f/u as outpt with Dr.  Dietrich Pates Long standing tobacco abuse- Nicotine patch  Possiblesubcortical impaction fractureof T8- Noted on CT on arrival. Patient denies pain to this area.No TTP A fib-Episode3/10, resolved after lopressor ID-PNA, Strep PNA on CX's. Cefdinir per Pharm, length per team not sure of plan FEN-Reg, nicotine patch, suppository yesterday without results, will add extra dose of miralax and dulcolax today VTE-SCDs,Lovenox Dispo-CIR  Emelia Loron 04/17/2019

## 2019-04-18 ENCOUNTER — Encounter (HOSPITAL_COMMUNITY): Payer: Self-pay | Admitting: Orthopedic Surgery

## 2019-04-18 ENCOUNTER — Other Ambulatory Visit: Payer: Self-pay

## 2019-04-18 ENCOUNTER — Inpatient Hospital Stay (HOSPITAL_COMMUNITY)
Admission: RE | Admit: 2019-04-18 | Discharge: 2019-04-22 | DRG: 561 | Disposition: A | Discharge status: Home Health | Payer: BC Managed Care – PPO | Source: Intra-hospital | Attending: Physical Medicine & Rehabilitation | Admitting: Physical Medicine & Rehabilitation

## 2019-04-18 ENCOUNTER — Encounter (HOSPITAL_COMMUNITY): Payer: Self-pay | Admitting: Physical Medicine & Rehabilitation

## 2019-04-18 DIAGNOSIS — I714 Abdominal aortic aneurysm, without rupture: Secondary | ICD-10-CM | POA: Diagnosis not present

## 2019-04-18 DIAGNOSIS — S2242XD Multiple fractures of ribs, left side, subsequent encounter for fracture with routine healing: Secondary | ICD-10-CM | POA: Diagnosis not present

## 2019-04-18 DIAGNOSIS — Z886 Allergy status to analgesic agent status: Secondary | ICD-10-CM

## 2019-04-18 DIAGNOSIS — S22069D Unspecified fracture of T7-T8 vertebra, subsequent encounter for fracture with routine healing: Secondary | ICD-10-CM

## 2019-04-18 DIAGNOSIS — F17210 Nicotine dependence, cigarettes, uncomplicated: Secondary | ICD-10-CM | POA: Diagnosis present

## 2019-04-18 DIAGNOSIS — S270XXA Traumatic pneumothorax, initial encounter: Secondary | ICD-10-CM

## 2019-04-18 DIAGNOSIS — N281 Cyst of kidney, acquired: Secondary | ICD-10-CM | POA: Diagnosis present

## 2019-04-18 DIAGNOSIS — S2232XA Fracture of one rib, left side, initial encounter for closed fracture: Secondary | ICD-10-CM

## 2019-04-18 DIAGNOSIS — K5900 Constipation, unspecified: Secondary | ICD-10-CM | POA: Diagnosis present

## 2019-04-18 DIAGNOSIS — Z880 Allergy status to penicillin: Secondary | ICD-10-CM

## 2019-04-18 DIAGNOSIS — R5381 Other malaise: Secondary | ICD-10-CM | POA: Diagnosis present

## 2019-04-18 DIAGNOSIS — W11XXXD Fall on and from ladder, subsequent encounter: Secondary | ICD-10-CM | POA: Diagnosis present

## 2019-04-18 DIAGNOSIS — W11XXXA Fall on and from ladder, initial encounter: Secondary | ICD-10-CM

## 2019-04-18 DIAGNOSIS — M7989 Other specified soft tissue disorders: Secondary | ICD-10-CM | POA: Diagnosis not present

## 2019-04-18 DIAGNOSIS — S2242XA Multiple fractures of ribs, left side, initial encounter for closed fracture: Secondary | ICD-10-CM

## 2019-04-18 DIAGNOSIS — S2242XS Multiple fractures of ribs, left side, sequela: Secondary | ICD-10-CM | POA: Diagnosis not present

## 2019-04-18 LAB — BASIC METABOLIC PANEL
Anion gap: 12 (ref 5–15)
BUN: 16 mg/dL (ref 8–23)
CO2: 31 mmol/L (ref 22–32)
Calcium: 8.9 mg/dL (ref 8.9–10.3)
Chloride: 92 mmol/L — ABNORMAL LOW (ref 98–111)
Creatinine, Ser: 0.77 mg/dL (ref 0.61–1.24)
GFR calc Af Amer: >60 mL/min (ref >60)
GFR calc non Af Amer: >60 mL/min (ref >60)
Glucose, Bld: 100 mg/dL — ABNORMAL HIGH (ref 70–99)
Potassium: 4 mmol/L (ref 3.5–5.1)
Sodium: 135 mmol/L (ref 135–145)

## 2019-04-18 LAB — CBC
HCT: 44 % (ref 39.0–52.0)
Hemoglobin: 14.2 g/dL (ref 13.0–17.0)
MCH: 28.7 pg (ref 26.0–34.0)
MCHC: 32.3 g/dL (ref 30.0–36.0)
MCV: 88.9 fL (ref 80.0–100.0)
Platelets: 458 10*3/uL — ABNORMAL HIGH (ref 150–400)
RBC: 4.95 MIL/uL (ref 4.22–5.81)
RDW: 13.8 % (ref 11.5–15.5)
WBC: 9 10*3/uL (ref 4.0–10.5)
nRBC: 0 % (ref 0.0–0.2)

## 2019-04-18 LAB — CREATININE, SERUM
Creatinine, Ser: 0.88 mg/dL (ref 0.61–1.24)
GFR calc Af Amer: >60 mL/min (ref >60)
GFR calc non Af Amer: >60 mL/min (ref >60)

## 2019-04-18 MED ORDER — ENOXAPARIN SODIUM 40 MG/0.4ML ~~LOC~~ SOLN
40.0000 mg | SUBCUTANEOUS | Status: DC
  Administered 2019-04-19 – 2019-04-21 (×3): 40 mg via SUBCUTANEOUS
  Filled 2019-04-18 (×3): qty 0.4

## 2019-04-18 MED ORDER — ACETAMINOPHEN 325 MG PO TABS
325.0000 mg | ORAL_TABLET | ORAL | Status: DC | PRN
  Administered 2019-04-18 – 2019-04-20 (×3): 650 mg via ORAL
  Filled 2019-04-18 (×4): qty 2

## 2019-04-18 MED ORDER — BISACODYL 10 MG RE SUPP
10.0000 mg | Freq: Every day | RECTAL | Status: DC | PRN
Start: 2019-04-18 — End: 2019-04-22

## 2019-04-18 MED ORDER — GABAPENTIN 100 MG PO CAPS
100.0000 mg | ORAL_CAPSULE | Freq: Three times a day (TID) | ORAL | Status: DC
  Administered 2019-04-18 – 2019-04-22 (×11): 100 mg via ORAL
  Filled 2019-04-18 (×11): qty 1

## 2019-04-18 MED ORDER — TRAMADOL HCL 50 MG PO TABS
100.0000 mg | ORAL_TABLET | Freq: Four times a day (QID) | ORAL | Status: DC
  Administered 2019-04-18 – 2019-04-22 (×13): 100 mg via ORAL
  Filled 2019-04-18 (×15): qty 2

## 2019-04-18 MED ORDER — ONDANSETRON HCL 4 MG/2ML IJ SOLN: 4.0000 mg | Freq: Four times a day (QID) | INTRAMUSCULAR | Status: DC | PRN

## 2019-04-18 MED ORDER — POLYETHYLENE GLYCOL 3350 17 G PO PACK
17.0000 g | PACK | Freq: Every day | ORAL | Status: DC
  Filled 2019-04-18 (×3): qty 1

## 2019-04-18 MED ORDER — CEFDINIR 300 MG PO CAPS
300.0000 mg | ORAL_CAPSULE | Freq: Two times a day (BID) | ORAL | Status: DC
  Administered 2019-04-18 – 2019-04-22 (×8): 300 mg via ORAL
  Filled 2019-04-18 (×8): qty 1

## 2019-04-18 MED ORDER — OXYCODONE HCL 5 MG PO TABS: 5.0000 mg | ORAL_TABLET | ORAL | Status: DC | PRN

## 2019-04-18 MED ORDER — DOCUSATE SODIUM 100 MG PO CAPS
100.0000 mg | ORAL_CAPSULE | Freq: Two times a day (BID) | ORAL | Status: DC
  Administered 2019-04-18 – 2019-04-22 (×6): 100 mg via ORAL
  Filled 2019-04-18 (×6): qty 1

## 2019-04-18 MED ORDER — ENOXAPARIN SODIUM 40 MG/0.4ML ~~LOC~~ SOLN: 40.0000 mg | SUBCUTANEOUS | Status: DC

## 2019-04-18 MED ORDER — ONDANSETRON 4 MG PO TBDP
4.0000 mg | ORAL_TABLET | Freq: Four times a day (QID) | ORAL | Status: DC | PRN
  Administered 2019-04-19: 09:00:00 4 mg via ORAL
  Filled 2019-04-18: qty 1

## 2019-04-18 MED ORDER — METHOCARBAMOL 500 MG PO TABS
1000.0000 mg | ORAL_TABLET | Freq: Three times a day (TID) | ORAL | Status: DC
  Administered 2019-04-18 – 2019-04-22 (×11): 1000 mg via ORAL
  Filled 2019-04-18 (×11): qty 2

## 2019-04-18 MED ORDER — NICOTINE 21 MG/24HR TD PT24
21.0000 mg | MEDICATED_PATCH | Freq: Every day | TRANSDERMAL | Status: DC
  Administered 2019-04-19 – 2019-04-22 (×4): 21 mg via TRANSDERMAL
  Filled 2019-04-18 (×4): qty 1

## 2019-04-18 MED ORDER — OXYCODONE HCL 5 MG PO TABS
10.0000 mg | ORAL_TABLET | ORAL | Status: DC | PRN
  Administered 2019-04-19 – 2019-04-22 (×5): 10 mg via ORAL
  Filled 2019-04-18 (×5): qty 2

## 2019-04-18 MED ORDER — IPRATROPIUM-ALBUTEROL 0.5-2.5 (3) MG/3ML IN SOLN: 3.0000 mL | Freq: Four times a day (QID) | RESPIRATORY_TRACT | Status: DC | PRN

## 2019-04-18 MED ORDER — MAGNESIUM CITRATE PO SOLN
0.5000 | Freq: Once | ORAL | Status: AC
  Administered 2019-04-18: 0.5 via ORAL
  Filled 2019-04-18: qty 296

## 2019-04-18 MED ORDER — SORBITOL 70 % SOLN: 30.0000 mL | Freq: Every day | Status: DC | PRN

## 2019-04-18 MED ORDER — SIMETHICONE 80 MG PO CHEW: 80.0000 mg | CHEWABLE_TABLET | Freq: Four times a day (QID) | ORAL | Status: DC | PRN

## 2019-04-18 NOTE — Progress Notes (Signed)
Inpatient Rehab Admissions Coordinator:   Received insurance authorization for admission to CIR. Have a bed available today and spoke to Barnetta Chapel, [place] who agrees.  Will let pt/family and CM know plan to admit today.   Estill Dooms, PT, DPT Admissions Coordinator [phone] 04/18/19  3:07 PM

## 2019-04-18 NOTE — Progress Notes (Signed)
[facility] Progress Note     Subjective: Patient complaining he is "tired of getting stuck" this AM. Some tightness in chest with deep breathing but was able to wean oxygen down. Was able to ambulate in halls yesterday. Asking about going home, discussed that PT recommendations were for CIR.   Review of Systems  Constitutional: Negative for chills and fever.  Respiratory: Positive for shortness of breath. Negative for wheezing.   Gastrointestinal: Positive for constipation. Negative for abdominal pain, nausea and vomiting.  Genitourinary: Negative for dysuria and urgency.     Objective: Vital signs in last 24 hours: Temp:  [98.3 F (36.8 C)-98.7 F (37.1 C)] 98.4 F (36.9 C) (03/15 0759) Pulse Rate:  [75-84] 76 (03/15 0759) Resp:  [18-23] 18 (03/15 0759) BP: (130-140)/(80-90) 130/90 (03/15 0759) SpO2:  [91 %-96 %] 96 % (03/15 0759) Last BM Date: 04/16/19  Intake/Output from previous day: 03/14 0701 - 03/15 0700 In: 600 [P.O.:600] Out: 1100 [Urine:1100] Intake/Output this shift: Total I/O In: -  Out: 350 [Urine:350]  PE: General: pleasant, WD, white male who is laying in bed in NAD HEENT: Sclera are noninjected.  PERRL.  Ears and nose without any masses or lesions.  Mouth is pink and moist Heart: regular, rate, and rhythm.  Normal s1,s2. No obvious murmurs, gallops, or rubs noted.  Palpable radial and pedal pulses bilaterally Lungs: CTAB, no wheezes, rhonchi, or rales noted.  Respiratory effort nonlabored Abd: soft, NT, ND, +BS, no masses, hernias, or organomegaly MS: all 4 extremities are symmetrical with no cyanosis, clubbing, or edema. Skin: warm and dry with no masses, lesions, or rashes Neuro: Cranial nerves 2-12 grossly intact, sensation grossly intact Psych: A&Ox3 with an appropriate affect.   Lab Results:  Recent Labs    04/16/19 0627  WBC 8.1  HGB 13.9  HCT 42.3  PLT 332   BMET Recent Labs    04/16/19 0627 04/18/19 0437  NA 133* 135   K 3.9 4.0  CL 92* 92*  CO2 29 31  GLUCOSE 98 100*  BUN 21 16  CREATININE 0.68 0.77  CALCIUM 8.9 8.9   PT/INR No results for input(s): LABPROT, INR in the last 72 hours. CMP     Component Value Date/Time   NA 135 04/18/2019 0437   K 4.0 04/18/2019 0437   CL 92 (L) 04/18/2019 0437   CO2 31 04/18/2019 0437   GLUCOSE 100 (H) 04/18/2019 0437   BUN 16 04/18/2019 0437   CREATININE 0.77 04/18/2019 0437   CALCIUM 8.9 04/18/2019 0437   PROT 6.8 04/09/2019 1645   ALBUMIN 4.0 04/09/2019 1645   AST 27 04/09/2019 1645   ALT 26 04/09/2019 1645   ALKPHOS 104 04/09/2019 1645   BILITOT 0.4 04/09/2019 1645   GFRNONAA >60 04/18/2019 0437   GFRAA >60 04/18/2019 0437   Lipase  No results found for: LIPASE     Studies/Results: No results found.  Anti-infectives: Anti-infectives (From admission, onward)   Start     Dose/Rate Route Frequency Ordered Stop   04/13/19 1000  cefdinir (OMNICEF) capsule 300 mg     300 mg Oral Every 12 hours 04/13/19 0929     04/11/19 1000  levofloxacin (LEVAQUIN) tablet 750 mg  Status:  Discontinued     750 mg Oral Daily 04/11/19 0839 04/13/19 0915       Assessment/Plan 43M s/p Fall from ladder Concussion- f/u concussion clinic. PT/OT Left rib fractures 2 -12 with occult pneumothorax -pulm toilet(flutter valve, IS),scheduled duoneb, lasix.Pain control,  pulm toilet. Wean oxygen as tolerated Abrasion posterior left lower extremity- local wound care Incidental finding of4.4cm AAA- f/u as outpt with Dr. Cena Benton Long standing tobacco abuse- Nicotine patch  Possiblesubcortical impaction fractureof T8- Noted on CT on arrival. Patient denies pain to this area.No TTP A fib-Episode3/10, resolved after lopressor  ID-PNA, Strep PNA on CX's. PO cefdinir 3/10>> FEN-Reg, nicotine patch, 1/2 bottle magnesium citrate today VTE-SCDs,Lovenox  Dispo-CIR pending insurance auth. Continue therapies, continue to wean oxygen   LOS: 9 days     Brigid Re , [facility] 04/18/2019, 8:42 AM Please see Amion for pager number during day hours 7:00am-4:30pm

## 2019-04-18 NOTE — PMR Pre-admission (Signed)
PMR Admission Coordinator Pre-Admission Assessment   Patient: Dale Perez is an 63 y.o., male MRN: [id number] DOB: 10/10/1955 Height: 6' 2" (188 cm) Weight: 74.3 kg   Insurance Information HMO:     PPO: yes     PCP:      IPA:      80/20:      OTHER:  PRIMARY: [place] [employer]      Policy#: [id number]      Subscriber: patient CM Name: Lisa B      Phone#: [phone] option 2     Fax#: [phone] Pre-Cert#: [id number] auth for CIR admission on 3/15 provided by Lisa with [employer] with updates due to fax listed above on 3/21      Employer: [employer] Benefits:  Phone #: [phone] > option 3 > option 2     Name:  Eff. Date: 04/15/2019     Deduct: $300 (met)      Out of Pocket Max: n/a      Life Max: n/a CIR: 100%      SNF: 100% (limit 70 days) Outpatient: 100%     Co-Pay:  Home Health: 100%      Limits: 80 visits, 4 hours=1 visit DME: 100% when ordered by a MD and in PPO network    Providers:  SECONDARY:       Policy#:       Subscriber:  CM Name:       Phone#:      Fax#:  Pre-Cert#:       Employer:  Benefits:  Phone #:      Name:  Eff. Date:      Deduct:       Out of Pocket Max:       Life Max:  CIR:       SNF:  Outpatient:      Co-Pay:  Home Health:       Co-Pay:  DME:      Co-Pay:    Medicaid Application Date:       Case Manager:  Disability Application Date:       Case Worker:    The "Data Collection Information Summary" for patients in Inpatient Rehabilitation Facilities with attached "Privacy Act Statement-Health Care Records" was provided and verbally reviewed with: N/A   Emergency Contact Information         Contact Information     Name Relation Home Work Mobile    Donofrio, LORI Spouse     [phone]         Current Medical History  Patient Admitting Diagnosis: debility and respiratory failure 2/2 multiple rib fractures, concussion   History of Present Illness: Dale Perez is a 63-year-old right-handed male with history of  tobacco abuse on no prescription medications.  Presented 04/09/2019 after a fall from a ladder approximately 20 feet while cutting down a tree limb.  There was reported brief loss of consciousness.  Admission chemistries unremarkable, WBC 10,600, alcohol negative, lactic acid 2.1.  Cranial CT scan as well as CT cervical spine showed no acute intracranial process.  No acute fracture or traumatic listhesis of the cervical spine.  CT of the chest abdomen and pelvis showed posterior left fourth through 12th minimally displaced rib fractures.  Additional anterior left second through sixth rib fracture.  Small left apical and medial pneumothorax and trace pleural thickening likely hemothorax.  Subtle sclerotic band subadjacent to the superior endplate cortex at T8 with some mild anterior wedging possibly reflecting   subcortical impaction fracture.  Incidental findings of 4.5 cm cyst within the upper pole of the right kidney as well as 4.4 cm AAA.  Hospital course pain management.  Noted episode of atrial fibrillation 04/13/2019 resolved after receiving Lopressor which is since been discontinued.  Strep PNA and currently maintained on Omnicef with latest chest x-ray 04/11/2019 showing no acute pulmonary process and patient is being weaned from oxygen therapy.  In regards to incidental findings of 4.4 cm infrarenal abdominal aortic aneurysm follow-up Dr. Clark vascular surgery as outpatient.  Subcutaneous Lovenox added for DVT prophylaxis.  Therapy evaluations completed and patient was recommended for a comprehensive rehab program.   Patient's medical record from [facility] has been reviewed by the rehabilitation admission coordinator and physician.   Past Medical History  History reviewed. No pertinent past medical history.   Family History   family history is not on file.   Prior Rehab/Hospitalizations Has the patient had prior rehab or hospitalizations prior to admission? No   Has the patient had major  surgery during 100 days prior to admission? No               Current Medications   Current Facility-Administered Medications:  .  acetaminophen (TYLENOL) tablet 650 mg, 650 mg, Oral, Q6H, Thompson, Burke, MD, 650 mg at 04/18/19 1322 .  bisacodyl (DULCOLAX) suppository 10 mg, 10 mg, Rectal, Daily PRN, Maczis, Michael M, PA-C, 10 mg at 04/16/19 0831 .  cefdinir (OMNICEF) capsule 300 mg, 300 mg, Oral, Q12H, Maczis, Michael M, PA-C, 300 mg at 04/18/19 1321 .  docusate sodium (COLACE) capsule 100 mg, 100 mg, Oral, BID, Maczis, Michael M, PA-C, 100 mg at 04/18/19 0859 .  enoxaparin (LOVENOX) injection 40 mg, 40 mg, Subcutaneous, Q24H, Thompson, Burke, MD, 40 mg at 04/18/19 0900 .  gabapentin (NEURONTIN) capsule 100 mg, 100 mg, Oral, TID, Thompson, Burke, MD, 100 mg at 04/18/19 0859 .  HYDROmorphone (DILAUDID) injection 1 mg, 1 mg, Intravenous, Q2H PRN, Thompson, Burke, MD, 1 mg at 04/15/19 2255 .  ipratropium-albuterol (DUONEB) 0.5-2.5 (3) MG/3ML nebulizer solution 3 mL, 3 mL, Nebulization, Q6H PRN, Wakefield, Matthew, MD .  methocarbamol (ROBAXIN) tablet 1,000 mg, 1,000 mg, Oral, TID, Maczis, Michael M, PA-C, 1,000 mg at 04/18/19 0859 .  metoprolol tartrate (LOPRESSOR) injection 5 mg, 5 mg, Intravenous, Q6H PRN, Connor, Chelsea A, MD, 5 mg at 04/12/19 0146 .  nicotine (NICODERM CQ - dosed in mg/24 hours) patch 21 mg, 21 mg, Transdermal, Daily, Thompson, Burke, MD, 21 mg at 04/18/19 0903 .  ondansetron (ZOFRAN-ODT) disintegrating tablet 4 mg, 4 mg, Oral, Q6H PRN **OR** ondansetron (ZOFRAN) injection 4 mg, 4 mg, Intravenous, Q6H PRN, Thompson, Burke, MD .  oxyCODONE (Oxy IR/ROXICODONE) immediate release tablet 10 mg, 10 mg, Oral, Q4H PRN, Thompson, Burke, MD, 10 mg at 04/18/19 0859 .  oxyCODONE (Oxy IR/ROXICODONE) immediate release tablet 5 mg, 5 mg, Oral, Q4H PRN, Thompson, Burke, MD .  polyethylene glycol (MIRALAX / GLYCOLAX) packet 17 g, 17 g, Oral, Daily, Maczis, Michael M, PA-C, 17 g at 04/18/19  0859 .  polyethylene glycol (MIRALAX / GLYCOLAX) packet 17 g, 17 g, Oral, Once, Wakefield, Matthew, MD .  simethicone (MYLICON) chewable tablet 80 mg, 80 mg, Oral, Q6H PRN, Connor, Chelsea A, MD, 80 mg at 04/15/19 2000 .  traMADol (ULTRAM) tablet 100 mg, 100 mg, Oral, Q6H, Thompson, Burke, MD, 100 mg at 04/18/19 1322   Patients Current Diet:     Diet Order                        Diet regular Room service appropriate? Yes; Fluid consistency: Thin  Diet effective now                   Precautions / Restrictions Precautions Precautions: Fall, Other (comment) Precaution Comments: watch sats Restrictions Weight Bearing Restrictions: No    Has the patient had 2 or more falls or a fall with injury in the past year? Yes, the fall that led to this admission.    Prior Activity Level Community (5-7x/wk): had been on FMLA but was planning to return on 3/23, no device for ambulation, driving   Prior Functional Level Self Care: Did the patient need help bathing, dressing, using the toilet or eating? Independent   Indoor Mobility: Did the patient need assistance with walking from room to room (with or without device)? Independent   Stairs: Did the patient need assistance with internal or external stairs (with or without device)? Independent   Functional Cognition: Did the patient need help planning regular tasks such as shopping or remembering to take medications? Independent   Home Assistive Devices / Equipment Home Assistive Devices/Equipment: Eyeglasses, Dentures (specify type) Home Equipment: Walker - 2 wheels(pt reports given to him on 51th birthday)   Prior Device Use: Indicate devices/aids used by the patient prior to current illness, exacerbation or injury? None of the above   Current Functional Level Cognition   Overall Cognitive Status: Impaired/Different from baseline Current Attention Level: Selective Orientation Level: Oriented X4 Following Commands: Follows multi-step  commands inconsistently, Follows multi-step commands with increased time, Follows one step commands consistently General Comments: Easily frustrated. Internally distracted. flat affect. anxious. Slow/delayed processing. Needs encouragment to mobilize despite pain.  R[place]igos Scales of Cognitive Functioning: Automatic/appropriate    Extremity Assessment (includes Sensation/Coordination)   Upper Extremity Assessment: Generalized weakness RUE Deficits / Details: difficult to fullly assess strength of bil. UEs and full AROM Of shoulders due to rib pain  LUE Deficits / Details: guarded  Lower Extremity Assessment: Defer to PT evaluation     ADLs   Overall ADL's : Needs assistance/impaired Eating/Feeding: Set up, Sitting Grooming: Wash/dry face, Wash/dry hands, Oral care, Minimal assistance, Sitting Upper Body Bathing: Moderate assistance, Sitting Lower Body Bathing: Maximal assistance, Sit to/from stand Upper Body Dressing : Moderate assistance, Sitting Lower Body Dressing: Total assistance, Sit to/from stand Toilet Transfer: Minimal assistance, Stand-pivot, RW Toilet Transfer Details (indicate cue type and reason): Min A for balance and safety.  Toileting- Clothing Manipulation and Hygiene: Moderate assistance, Sit to/from stand Functional mobility during ADLs: Minimal assistance, Rolling walker(stand pivot) General ADL Comments: Pt completed bed mobility, stood EOB and sidestepped and walked in place with encouragement to mobilize despite pain. Pt then pivoted to recliner     Mobility   Overal bed mobility: Needs Assistance Bed Mobility: Supine to Sit Supine to sit: HOB elevated, Min guard(with rail) Sit to supine: Min assist, HOB elevated General bed mobility comments: incr time and cues for sequencing; exit to his right; allowing HOB elevated, use of rail as he will use recliner at home     Transfers   Overall transfer level: Needs assistance Equipment used: Rolling walker (2  wheeled) Transfers: Sit to/from Stand Sit to Stand: Min assist Stand pivot transfers: Min assist General transfer comment: cues for technique and to move through the pain; assist to steady RW as he comes to stand; increased time to complete task     Ambulation / Gait / Stairs / Wheelchair Mobility   Ambulation/Gait Ambulation/Gait assistance: Min   guard Gait Distance (Feet): 160 Feet Assistive device: Rolling walker (2 wheeled) Gait Pattern/deviations: Decreased stride length, Step-through pattern, Trunk flexed General Gait Details: Min guard for safety with lines, cues for pursed lip breathing to maintain sats >88%, tendency for downward gaze, decreased speed Gait velocity: very slow, disctracted by pain Gait velocity interpretation: <1.8 ft/sec, indicate of risk for recurrent falls Stairs: (began to discuss 5 STE home, pt too fatigued with decr sats)     Posture / Balance Dynamic Sitting Balance Sitting balance - Comments: pt could not tolerated sitting without use of RUE Balance Overall balance assessment: Needs assistance Sitting-balance support: Feet supported, Single extremity supported Sitting balance-Leahy Scale: Fair Sitting balance - Comments: pt could not tolerated sitting without use of RUE Postural control: Right lateral lean Standing balance support: No upper extremity supported Standing balance-Leahy Scale: Fair Standing balance comment: Able to maintain static standing without UE support     Special needs/care consideration BiPAP/CPAP no CPM no Continuous Drip IV no Dialysis no        Days n/a Life Vest no Oxygen 2L *has been requiring up to 5L for mobility but is weaning well Special Bed no Trach Size no Wound Vac (area) no      Location n/a Skin                               Location  Bowel mgmt: continent Bladder mgmt: continent Diabetic mgmt: no Behavioral consideration no Chemo/radiation no    Previous Home Environment (from acute therapy  documentation) Living Arrangements: Spouse/significant other Available Help at Discharge: Family, Available 24 hours/day Type of Home: House Home Layout: One level Home Access: Stairs to enter Entrance Stairs-Rails: Can reach both Entrance Stairs-Number of Steps: 5 Bathroom Shower/Tub: Tub/shower unit, Curtain Bathroom Toilet: Standard(bathroom he uses; other has handicap ) Home Care Services: No   Discharge Living Setting Plans for Discharge Living Setting: Patient's home Type of Home at Discharge: House Discharge Home Layout: One level Discharge Home Access: Stairs to enter Entrance Stairs-Rails: Can reach both Entrance Stairs-Number of Steps: 5 Discharge Bathroom Shower/Tub: Tub/shower unit Discharge Bathroom Toilet: Standard Discharge Bathroom Accessibility: Yes How Accessible: Accessible via walker Does the patient have any problems obtaining your medications?: No   Social/Family/Support Systems Patient Roles: Spouse Anticipated Caregiver: Lori Lazcano Anticipated Caregiver's Contact Information: [phone] Ability/Limitations of Caregiver: works from home, supervision only Caregiver Availability: 24/7 Discharge Plan Discussed with Primary Caregiver: Yes Is Caregiver In Agreement with Plan?: Yes Does Caregiver/Family have Issues with Lodging/Transportation while Pt is in Rehab?: No   Goals/Additional Needs Patient/Family Goal for Rehab: PT/OT mod I, SLP n/a Expected length of stay: 3-5 days Dietary Needs: reg/thin Pt/Family Agrees to Admission and willing to participate: Yes Program Orientation Provided & Reviewed with Pt/Caregiver Including Roles  & Responsibilities: Yes   Decrease burden of Care through IP rehab admission: n/a   Possible need for SNF placement upon discharge: Not anticipated.    Patient Condition: I have reviewed medical records from [facility], spoken with CM, and patient and spouse. I met with patient at the bedside for inpatient  rehabilitation assessment.  Patient will benefit from ongoing PT and OT, can actively participate in 3 hours of therapy a day 5 days of the week, and can make measurable gains during the admission.  Patient will also benefit from the coordinated team approach during an Inpatient Acute Rehabilitation admission.  The patient will receive intensive   therapy as well as Rehabilitation physician, nursing, social worker, and care management interventions.  Due to safety, medication administration, pain management and patient education the patient requires 24 hour a day rehabilitation nursing.  The patient is currently min assist with mobility and basic ADLs.  Discharge setting and therapy post discharge at home is anticipated.  Patient has agreed to participate in the Acute Inpatient Rehabilitation Program and will admit today.   Preadmission Screen Completed By:  Caitlin E Warren, PT, DPT 04/18/2019 3:26 PM ______________________________________________________________________   Discussed status with Dr. Lacrystal Barbe on 04/18/19  at 3:38 PM  and received approval for admission today.   Admission Coordinator:  Caitlin E Warren, PT, DPT time 3:38 PM /Date 04/18/19     Assessment/Plan: Diagnosis:  Multiple rib fractures due to fall with impaired mobility and self care skills 1. Does the need for close, 24 hr/day Medical supervision in concert with the patient's rehab needs make it unreasonable for this patient to be served in a less intensive setting? Yes 2. Co-Morbidities requiring supervision/potential complications: Pneumonia, hypoxia due to rib fracture 3. Due to bladder management, bowel management, safety, skin/wound care, disease management, medication administration, pain management and patient education, does the patient require 24 hr/day rehab nursing? Yes 4. Does the patient require coordinated care of a physician, rehab nurse, PT, OT, and SLP to address physical and functional deficits in the context of  the above medical diagnosis(es)? Yes Addressing deficits in the following areas: balance, endurance, locomotion, strength, transferring, bowel/bladder control, bathing, dressing, toileting and psychosocial support 5. Can the patient actively participate in an intensive therapy program of at least 3 hrs of therapy 5 days a week? Yes 6. The potential for patient to make measurable gains while on inpatient rehab is good 7. Anticipated functional outcomes upon discharge from inpatient rehab: modified independent PT, supervision OT, n/a SLP 8. Estimated rehab length of stay to reach the above functional goals is: 5-7d 9. Anticipated discharge destination: Home 10. Overall Rehab/Functional Prognosis: good     MD Signature: Francia Verry E. Johnross Nabozny M.D.  Medical Group FAAPM&R (Neuromuscular Med) Diplomate Am Board of Electrodiagnostic Med Fellow Am Board of Interventional Pain  

## 2019-04-18 NOTE — Progress Notes (Signed)
SATURATION QUALIFICATIONS: (This note is used to comply with regulatory documentation for home oxygen)  Patient Saturations on Room Air at Rest = 87%  Patient Saturations on Room Air while Ambulating = NA%  Patient Saturations on 1 Liters of oxygen while Ambulating = 90-94%  Please briefly explain why patient needs home oxygen:  To maintain sats >87% for appropriate oxygenation during functional mobiity.   Jerolyn Center, PT Pager [phone]

## 2019-04-18 NOTE — TOC Transition Note (Signed)
Transition of Care [facility]) - CM/SW Discharge Note   Patient Details  Name: Dale Perez MRN: [id number] Date of Birth: January 25, 1956  Transition of [facility]) CM/SW Contact:  Glennon Mac, RN Phone Number: 04/18/2019, 3:19 PM   Clinical Narrative:   Pt medically stable for discharge, and insurance authorization received for admission to [facility] IP Rehab today.  Plan dc to CIR when bed available.     Final next level of care: IP Rehab Facility Barriers to Discharge: Continued Medical Work up   Patient Goals and CMS Choice Patient states their goals for this hospitalization and ongoing recovery are:: To go back home CMS Medicare.gov Compare Post Acute Care list provided to:: Patient Choice offered to / list presented to : Patient                         Discharge Plan and Services   Discharge Planning Services: CM Consult Post Acute Care Choice: IP Rehab                               Social Determinants of Health (SDOH) Interventions     Readmission Risk Interventions No flowsheet data found.  Quintella Baton, RN, BSN  Trauma/Neuro ICU Case Manager [phone]

## 2019-04-18 NOTE — Plan of Care (Signed)

## 2019-04-18 NOTE — Progress Notes (Signed)
Physical Therapy Treatment Patient Details Name: Dale Perez MRN: [id number] DOB: [birth year] Today's Date: 04/18/2019    History of Present Illness 64 y.o. male admitted after 74ft fall from ladder while cutting tree lims.  He was + for brief LOC.  He was found to have:  Lt sided rib fractures 2-12 with tiny pneumothorax., concussion - head CT negative for acute abnormalit GCS 15.   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 04/11/19 episode of HR up to 150 at rest. Required incr to 10L venturi mask.  PMH includes: s/p shoulder athroscopy.     PT Comments    Patient able to tolerate increased activity with lesser intensity of pain, however continues with slow velocity related to guarding to avoid pain. He continues to require supplemental O2 to maintain sats >87% (see ambulatory saturation progress note). Required incr time (~5 minutes) to recover to 90% on 1L after ambulation (hovering 87-88%). Reviewed discharge planning and pt feels he is better, but does not feel he is close to being able to manage at home with assist of his wife.     Follow Up Recommendations  CIR;Supervision/Assistance - 24 hour     Equipment Recommendations  None recommended by PT(pt now reports he has a walker given to him as a joke)    Recommendations for Other Services       Precautions / Restrictions Precautions Precautions: Fall;Other (comment) Precaution Comments: watch sats Restrictions Weight Bearing Restrictions: No    Mobility  Bed Mobility Overal bed mobility: Needs Assistance Bed Mobility: Supine to Sit     Supine to sit: HOB elevated;Min guard(with rail)     General bed mobility comments: incr time and cues for sequencing; exit to his right; allowing HOB elevated, use of rail as he will use recliner at home  Transfers Overall transfer level: Needs assistance Equipment used: Rolling walker (2 wheeled) Transfers: Sit to/from Stand Sit to Stand: Min assist         General transfer  comment: cues for technique and to move through the pain; assist to steady RW as he comes to stand; increased time to complete task  Ambulation/Gait Ambulation/Gait assistance: Min guard Gait Distance (Feet): 160 Feet Assistive device: Rolling walker (2 wheeled) Gait Pattern/deviations: Decreased stride length;Step-through pattern;Trunk flexed Gait velocity: very slow, disctracted by pain   General Gait Details: Min guard for safety with lines, cues for pursed lip breathing to maintain sats >88%, tendency for downward gaze, decreased speed   Stairs Stairs: (began to discuss 5 STE home, pt too fatigued with decr sats)           Wheelchair Mobility    Modified Rankin (Stroke Patients Only)       Balance Overall balance assessment: Needs assistance Sitting-balance support: Feet supported;Single extremity supported Sitting balance-Leahy Scale: Fair     Standing balance support: No upper extremity supported Standing balance-Leahy Scale: Fair Standing balance comment: Able to maintain static standing without UE support                            Cognition Arousal/Alertness: Awake/alert Behavior During Therapy: Anxious Overall Cognitive Status: Impaired/Different from baseline Area of Impairment: Following commands;Awareness;Problem solving;Attention               Rancho Levels of Cognitive Functioning Rancho Los Amigos Scales of Cognitive Functioning: Automatic/appropriate   Current Attention Level: Selective   Following Commands: Follows multi-step commands inconsistently;Follows multi-step commands with increased  time;Follows one step commands consistently   Awareness: Anticipatory Problem Solving: Slow processing;Decreased initiation;Requires verbal cues General Comments: Easily frustrated. Internally distracted. flat affect. anxious. Slow/delayed processing. Needs encouragment to mobilize despite pain.       Exercises      General Comments  General comments (skin integrity, edema, etc.): on 1L on arrival; sats 92%; performed ambulatory saturation monitoring with pt requiring 1L for sats >87%; on return to sitting, pt with sats hovering at 87% on 1L (with talking), increased to 88% without talking, however required ~5 minutes to recover to 90% with cues for pursed lip breathing; re-educated on importance of IS and flutter valve and both within reach; further activity deferred. RN made aware.       Pertinent Vitals/Pain Pain Assessment: Faces Faces Pain Scale: Hurts whole lot Pain Location: L flank Pain Descriptors / Indicators: Grimacing;Guarding;Sharp;Sore Pain Intervention(s): Limited activity within patient's tolerance;Monitored during session;Premedicated before session;Repositioned    Home Living               Home Equipment: Walker - 2 wheels(pt reports given to him on 64th birthday)      Prior Function            PT Goals (current goals can now be found in the care plan section) Acute Rehab PT Goals Patient Stated Goal: decrease pain Time For Goal Achievement: 04/24/19 Potential to Achieve Goals: Good Progress towards PT goals: Progressing toward goals    Frequency    Min 5X/week      PT Plan Current plan remains appropriate    Co-evaluation              AM-PAC PT "6 Clicks" Mobility   Outcome Measure  Help needed turning from your back to your side while in a flat bed without using bedrails?: A Lot Help needed moving from lying on your back to sitting on the side of a flat bed without using bedrails?: A Lot Help needed moving to and from a bed to a chair (including a wheelchair)?: A Little Help needed standing up from a chair using your arms (e.g., wheelchair or bedside chair)?: A Little Help needed to walk in hospital room?: A Little Help needed climbing 3-5 steps with a railing? : A Lot 6 Click Score: 15    End of Session Equipment Utilized During Treatment: Oxygen Activity  Tolerance: Treatment limited secondary to medical complications (Comment)(decreased sats) Patient left: with call bell/phone within reach;in chair Nurse Communication: Mobility status;Other (comment)(sats with activity) PT Visit Diagnosis: Difficulty in walking, not elsewhere classified (R26.2);Pain Pain - Right/Left: Left Pain - part of body: (trunk/ribs)     Time: 8416-6063 PT Time Calculation (min) (ACUTE ONLY): 33 min  Charges:  $Gait Training: 23-37 mins                      Dale Perez, PT Pager [phone]    Dale Perez 04/18/2019, 10:24 AM

## 2019-04-18 NOTE — H&P (Signed)
Physical Medicine and Rehabilitation Admission H&P        Chief Complaint  Patient presents with  . Fall  : HPI: Dale Perez is a 64 year old right-handed male with history of tobacco abuse on no prescription medications.  Per chart review patient lives with spouse.  Independent prior to admission.  1 level home 5 steps to entry.  By report patient had been on Worker's Comp. due to a elevator accident required knee and shoulder surgery was scheduled to go back to work 04/26/2019.  Presented 04/09/2019 after a fall from a ladder approximately 20 feet while cutting down a tree limb.  There was reported brief loss of consciousness.  Admission chemistries unremarkable, WBC 10,600, alcohol negative, lactic acid 2.1.  Cranial CT scan as well as CT cervical spine showed no acute intracranial process.  No acute fracture or traumatic listhesis of the cervical spine.  CT of the chest abdomen and pelvis showed posterior left fourth through 12th minimally displaced rib fractures.  Additional anterior left second through sixth rib fracture.  Small left apical and medial pneumothorax and trace pleural thickening likely hemothorax.  Subtle sclerotic band subadjacent to the superior endplate cortex at T8 with some mild anterior wedging possibly reflecting subcortical impaction fracture.  Incidental findings of 4.5 cm cyst within the upper pole of the right kidney as well as 4.4 cm AAA.  Hospital course pain management.  Noted episode of atrial fibrillation 04/13/2019 resolved after receiving Lopressor which is since been discontinued.  Strep PNA and currently maintained on Omnicef with latest chest x-ray 04/11/2019 showing no acute pulmonary process and patient is being weaned from oxygen therapy.  In regards to incidental findings of 4.4 cm infrarenal abdominal aortic aneurysm follow-up Dr. Chestine Spore vascular surgery as outpatient.  Subcutaneous Lovenox added for DVT prophylaxis.  Therapy evaluations completed and patient was  admitted for a comprehensive rehab program.   Review of Systems  Constitutional: Negative for chills and fever.  HENT: Negative for hearing loss.   Eyes: Negative for blurred vision and double vision.  Respiratory: Positive for shortness of breath. Negative for cough.   Cardiovascular: Positive for leg swelling. Negative for chest pain and palpitations.  Gastrointestinal: Positive for constipation. Negative for heartburn, nausea and vomiting.  Genitourinary: Negative for dysuria, flank pain and hematuria.  Musculoskeletal: Positive for joint pain and myalgias.  Skin: Negative for rash.  Neurological: Positive for weakness.       Transient loss of consciousness after recent fall.  All other systems reviewed and are negative.   History reviewed. No pertinent past medical history.      Past Surgical History:  Procedure Laterality Date  . KNEE CARTILAGE SURGERY Right 02/2019  . SHOULDER ARTHROSCOPY Left 08/2018    History reviewed. No pertinent family history. Social History:  reports that he has been smoking cigarettes. He has a 40.00 pack-year smoking history. He has never used smokeless tobacco. He reports current alcohol use of about 1.0 standard drinks of alcohol per week. He reports that he does not use drugs. Allergies:      Allergies  Allergen Reactions  . Aspirin    . Penicillins            Medications Prior to Admission  Medication Sig Dispense Refill  . naproxen sodium (ALEVE) 220 MG tablet Take 440 mg by mouth daily.          Drug Regimen Review Drug regimen was reviewed and remains appropriate with no significant issues identified  Home: Home Living Family/patient expects to be discharged to:: Private residence Living Arrangements: Spouse/significant other Available Help at Discharge: Family, Available 24 hours/day Type of Home: House Home Access: Stairs to enter [employer] of Steps: 5 Entrance Stairs-Rails: Can reach both Home Layout: One  level Bathroom Shower/Tub: Tub/shower unit, Engineer, building services: Standard(bathroom he uses; other has handicap ) Home Equipment: None(may have walker and shower seat from mother-in-law)   Functional History: Prior Function Level of Independence: Independent Comments: has been on Worker's Comp due to elevator accident required knee and shoulder surgery; was due to go back to work 04/26/19   Functional Status:  Mobility: Bed Mobility Overal bed mobility: Needs Assistance Bed Mobility: Sit to Supine Supine to sit: Mod assist, HOB elevated Sit to supine: Min assist, HOB elevated General bed mobility comments: ModA for BLE negotiation back into bed Transfers Overall transfer level: Needs assistance Equipment used: Rolling walker (2 wheeled) Transfers: Sit to/from Stand Sit to Stand: Min assist Stand pivot transfers: Min assist General transfer comment: assist to steady. cues for technique. increased time to complete task Ambulation/Gait Ambulation/Gait assistance: Min guard Gait Distance (Feet): 50 Feet Assistive device: Rolling walker (2 wheeled) Gait Pattern/deviations: Decreased stride length, Shuffle, Step-through pattern, Trunk flexed General Gait Details: Min guard for stability, tendency for downward gaze, decreased speed Gait velocity: very slow, disctracted by pain Gait velocity interpretation: <1.8 ft/sec, indicate of risk for recurrent falls   ADL: ADL Overall ADL's : Needs assistance/impaired Eating/Feeding: Set up, Sitting Grooming: Wash/dry face, Wash/dry hands, Oral care, Minimal assistance, Sitting Upper Body Bathing: Moderate assistance, Sitting Lower Body Bathing: Maximal assistance, Sit to/from stand Upper Body Dressing : Moderate assistance, Sitting Lower Body Dressing: Total assistance, Sit to/from stand Toilet Transfer: Minimal assistance, Stand-pivot, RW Toilet Transfer Details (indicate cue type and reason): Min A for balance and safety.  Toileting-  Clothing Manipulation and Hygiene: Moderate assistance, Sit to/from stand Functional mobility during ADLs: Minimal assistance, Rolling walker(stand pivot) General ADL Comments: Pt completed bed mobility, stood EOB and sidestepped and walked in place with encouragement to mobilize despite pain. Pt then pivoted to recliner   Cognition: Cognition Overall Cognitive Status: Impaired/Different from baseline Orientation Level: Oriented X4 Rancho Mirant Scales of Cognitive Functioning: Automatic/appropriate Cognition Arousal/Alertness: Awake/alert Behavior During Therapy: Anxious Overall Cognitive Status: Impaired/Different from baseline Area of Impairment: Following commands, Awareness, Problem solving Current Attention Level: Selective Memory: Decreased short-term memory Following Commands: Follows one step commands with increased time, Follows multi-step commands inconsistently, Follows multi-step commands with increased time Awareness: Emergent Problem Solving: Slow processing, Decreased initiation, Requires verbal cues General Comments: Easily frustrated. Internally distracted. flat affect. anxious. Slow/delayed processing. Needs encouragment to mobilize despite pain.    Physical Exam: Blood pressure 135/88, pulse 84, temperature 98.7 F (37.1 C), resp. rate (!) 23, height 6\' 2"  (1.88 m), weight 74.3 kg, SpO2 92 %. Physical Exam  Neurological:  Patient is alert sitting up in chair with wife at bedside.  Oriented to person place and time.  Follows commands.  He does recall the fall.    General: No acute distress Mood and affect are appropriate Heart: Regular rate and rhythm no rubs murmurs or extra sounds Lungs: Clear to auscultation, breathing unlabored, no rales or wheezes Chest tenderness left posterior/lateral and upper ant Abdomen: Positive bowel sounds, soft nontender to palpation, nondistended Extremities: No clubbing, cyanosis, or edema Skin: No evidence of breakdown, no  evidence of rash Neurologic: , motor strength is 5/5 in Right and 4- left  deltoid, bicep, tricep, grip,5/5 in bilateral  hip flexor, knee extensors, ankle dorsiflexor and plantar flexor Sensory exam normal sensation to light touch and proprioception in bilateral upper and lower extremities Cerebellar exam normal finger to nose to finger as well as heel to shin  bilateral upperMusculoskeletal:Reduced ROM Left shoulder abd and flexion due to pain     Lab Results Last 48 Hours        Results for orders placed or performed during the hospital encounter of 04/09/19 (from the past 48 hour(s))  CBC     Status: None    Collection Time: 04/16/19  6:27 AM  Result Value Ref Range    WBC 8.1 4.0 - 10.5 K/uL    RBC 4.91 4.22 - 5.81 MIL/uL    Hemoglobin 13.9 13.0 - 17.0 g/dL    HCT 42.3 39.0 - 52.0 %    MCV 86.2 80.0 - 100.0 fL    MCH 28.3 26.0 - 34.0 pg    MCHC 32.9 30.0 - 36.0 g/dL    RDW 14.0 11.5 - 15.5 %    Platelets 332 150 - 400 K/uL    nRBC 0.0 0.0 - 0.2 %      Comment: Performed at [facility], [place] [address], [place], [place] [address]  Basic metabolic panel     Status: Abnormal    Collection Time: 04/16/19  6:27 AM  Result Value Ref Range    Sodium 133 (L) 135 - 145 mmol/L    Potassium 3.9 3.5 - 5.1 mmol/L    Chloride 92 (L) 98 - 111 mmol/L    CO2 29 22 - 32 mmol/L    Glucose, Bld 98 70 - 99 mg/dL      Comment: Glucose reference range applies only to samples taken after fasting for at least 8 hours.    BUN 21 8 - 23 mg/dL    Creatinine, Ser 0.68 0.61 - 1.24 mg/dL    Calcium 8.9 8.9 - 10.3 mg/dL    GFR calc non Af Amer >60 >60 mL/min    GFR calc Af Amer >60 >60 mL/min    Anion gap 12 5 - 15      Comment: Performed at [facility] [address] [place], [place] [address]  Basic metabolic panel     Status: Abnormal    Collection Time: 04/18/19  4:37 AM  Result Value Ref Range    Sodium 135 135 - 145 mmol/L    Potassium 4.0 3.5 - 5.1 mmol/L    Chloride 92 (L) 98  - 111 mmol/L    CO2 31 22 - 32 mmol/L    Glucose, Bld 100 (H) 70 - 99 mg/dL      Comment: Glucose reference range applies only to samples taken after fasting for at least 8 hours.    BUN 16 8 - 23 mg/dL    Creatinine, Ser 0.77 0.61 - 1.24 mg/dL    Calcium 8.9 8.9 - 10.3 mg/dL    GFR calc non Af Amer >60 >60 mL/min    GFR calc Af Amer >60 >60 mL/min    Anion gap 12 5 - 15      Comment: Performed at [facility] [address], [place],  [address]      Imaging Results (Last 48 hours)  No results found.           Medical Problem List and Plan: 1.  Decreased functional mobility secondary to fall/concussion/multiple rib  fractures with occult pneumothorax/possible subcortical impaction fracture of T8.  Conservative care             -patient may  shower             -ELOS/Goals: 7-10d 2.  Antithrombotics: -DVT/anticoagulation: Lovenox initiated 04/10/2019.  Check vascular study             -antiplatelet therapy: N/A 3. Pain Management: Tramadol 100 mg every 6 hours, Neurontin 100 mg 3 times daily, Robaxin 1000 mg 3 times daily, oxycodone as needed 4. Mood: Provide emotional support             -antipsychotic agents: N/A 5. Neuropsych: This patient is capable of making decisions on his own behalf. 6. Skin/Wound Care: Routine skin checks 7. Fluids/Electrolytes/Nutrition: Routine in and outs with follow-up chemistries 8.  Tobacco abuse/pneumonia.  NicoDerm patch.  Provide counseling.  Complete course of Omnicef.  Check currently day 3/10. Oxygen saturations every shift 9.  Transient episode atrial fibrillation.  Resolved.  Cardiac rate controlled 10.  Constipation.  Laxative assistance.        Mcarthur Rossetti Angiulli, PA-C 04/18/2019  "I have personally performed a face to face diagnostic evaluation of this patient.  Additionally, I have reviewed and concur with the physician assistant's documentation above." Erick Colace M.D. [facility] FAAPM&R  (Neuromuscular Med) Diplomate Am Board of Electrodiagnostic Med Fellow Am Board of Interventional Pain

## 2019-04-18 NOTE — Progress Notes (Signed)
OT Cancellation Note  Patient Details Name: [name] MRN: [id number] DOB: 1955-07-08   Cancelled Treatment:    Reason Eval/Treat Not Completed: Other (comment); pt working with PT, mobilizing in hallway. Will follow up for OT treatment session at a later time/as schedule permits.  [name], OT Acute Rehabilitation Services Pager [phone] Office [phone]   [name] 04/18/2019, 9:50 AM

## 2019-04-18 NOTE — Discharge Summary (Signed)
Patient ID: Dale Perez [id number] 09-Nov-1955 64 y.o.  Admit date: 04/09/2019 Discharge date: 04/18/2019  Admitting Diagnosis: Fall from ladder Concussion Left rib fractures 2 through 12 with occult pneumothorax Abrasion posterior left lower extremity Incidental finding of 4.4 cm AAA  Discharge Diagnosis Patient Active Problem List   Diagnosis Date Noted  . Left rib fracture 04/09/2019  36M s/p Fall from ladder Concussion Left rib fractures 2 -12 with occult pneumothorax Abrasion posterior left lower extremity Incidental finding of4.4cm AAA Long standing tobacco abuse Possiblesubcortical impaction fractureof T8 A fib  Consultants none  Reason for Admission: Jule was up almost 20 feet on a ladder cutting down a limb in his yard.  When the limb fell, it swept the ladder out and he fell landing on his left side.  Brief loss of consciousness.  He was transported as a level 2 trauma.  Work-up has revealed multiple left-sided rib fractures with a tiny pneumothorax and I was asked to see him for admission.  He complains of pain along his left ribs as well as pain from an abrasion behind his left knee.  Procedures none  Hospital Course:  36M s/p Fall from ladder Concussion Patient noted to have a concussion.  SLP evaluated the patient.  We will refer him to the concussion clinic as an outpatient.  Left rib fractures 2 -12 with occult pneumothorax  The patient was admitted with multiple rib fractures as well an occult PTX.  This did not require a chest tube to be placed.  He had issues with pain control and needed several days to get control while working with therapies and to also wean off of O2.  CIR was recommended for continued care.aggressive pulmonary toileting was ordered during his stay as well.  Abrasion posterior left lower extremity  local wound care  Incidental finding of4.4cm AAA This was incidentally found on his imaging.  He will f/u as  outpt with Dr. Jacinto Halim vascular surgery.  Long standing tobacco abuse  Nicotine patch   Possiblesubcortical impaction fractureof T8  Noted on CT on arrival. Patient denies pain to this area.No TTP  A fib Had an episode on 3/10 for which he received lopressor.  This resolved with no further issues.  This information was taken from the chart as I have not personally seen this patient.  Physical Exam: General: pleasant, WD, white male who is laying in bed in NAD HEENT: Sclera are noninjected.  PERRL.  Ears and nose without any masses or lesions.  Mouth is pink and moist Heart: regular, rate, and rhythm.  Normal s1,s2. No obvious murmurs, gallops, or rubs noted.  Palpable radial and pedal pulses bilaterally Lungs: CTAB, no wheezes, rhonchi, or rales noted.  Respiratory effort nonlabored Abd: soft, NT, ND, +BS, no masses, hernias, or organomegaly MS: all 4 extremities are symmetrical with no cyanosis, clubbing, or edema. Skin: warm and dry with no masses, lesions, or rashes Neuro: Cranial nerves 2-12 grossly intact, sensation grossly intact Psych: A&Ox3 with an appropriate affect.  From progress note earlier today  Medications: All will be resumed on admit to CIR   Follow-up Information    Cephus Shelling, MD. Schedule an appointment as soon as possible for a visit.   Specialty: Vascular Surgery Why: For the Incidental finding of 4.4 cm AAA on CT Contact information: [address] [place] [address] [phone]        [facility], [facility]. Call.   Specialty: Family Medicine Why: Please call  your PCP and make them aware you were in the hospital and follow up as needed for rib fractures Contact information: [place] [address] [phone]        [facility] [place] Follow up.   Why: No follow up needed.  Call if you have questions Contact information: [place]  [phone]          Signed: Saverio Danker, [facility] 04/18/2019, 3:02 PM Please see Amion for pager number during day hours 7:00am-4:30pm, 7-11:30am on Weekends

## 2019-04-18 NOTE — Progress Notes (Signed)
Patient arrived to unit 4W10, no s/s of distress noted at this time. Patient on 1liter of oxygen via nasal cannula. A lot of pain noted to left rib cage area. Wife at bedside, no complications noted at this time. Jay Schlichter, LPN

## 2019-04-19 ENCOUNTER — Inpatient Hospital Stay (HOSPITAL_COMMUNITY): Payer: BC Managed Care – PPO | Admitting: Physical Therapy

## 2019-04-19 ENCOUNTER — Inpatient Hospital Stay (HOSPITAL_COMMUNITY): Payer: BC Managed Care – PPO | Admitting: Occupational Therapy

## 2019-04-19 ENCOUNTER — Inpatient Hospital Stay (HOSPITAL_COMMUNITY): Payer: BC Managed Care – PPO

## 2019-04-19 DIAGNOSIS — M7989 Other specified soft tissue disorders: Secondary | ICD-10-CM | POA: Diagnosis not present

## 2019-04-19 DIAGNOSIS — S2242XA Multiple fractures of ribs, left side, initial encounter for closed fracture: Secondary | ICD-10-CM | POA: Diagnosis not present

## 2019-04-19 DIAGNOSIS — S2242XS Multiple fractures of ribs, left side, sequela: Secondary | ICD-10-CM | POA: Diagnosis not present

## 2019-04-19 DIAGNOSIS — S2242XD Multiple fractures of ribs, left side, subsequent encounter for fracture with routine healing: Secondary | ICD-10-CM | POA: Diagnosis not present

## 2019-04-19 LAB — CBC WITH DIFFERENTIAL/PLATELET
Abs Immature Granulocytes: 0.52 10*3/uL — ABNORMAL HIGH (ref 0.00–0.07)
Basophils Absolute: 0.1 10*3/uL (ref 0.0–0.1)
Basophils Relative: 1 %
Eosinophils Absolute: 0.4 10*3/uL (ref 0.0–0.5)
Eosinophils Relative: 5 %
HCT: 40.9 % (ref 39.0–52.0)
Hemoglobin: 13.4 g/dL (ref 13.0–17.0)
Immature Granulocytes: 6 %
Lymphocytes Relative: 22 %
Lymphs Abs: 2.1 10*3/uL (ref 0.7–4.0)
MCH: 28.8 pg (ref 26.0–34.0)
MCHC: 32.8 g/dL (ref 30.0–36.0)
MCV: 87.8 fL (ref 80.0–100.0)
Monocytes Absolute: 0.9 10*3/uL (ref 0.1–1.0)
Monocytes Relative: 10 %
Neutro Abs: 5.4 10*3/uL (ref 1.7–7.7)
Neutrophils Relative %: 56 %
Platelets: 450 10*3/uL — ABNORMAL HIGH (ref 150–400)
RBC: 4.66 MIL/uL (ref 4.22–5.81)
RDW: 13.7 % (ref 11.5–15.5)
WBC: 9.4 10*3/uL (ref 4.0–10.5)
nRBC: 0 % (ref 0.0–0.2)

## 2019-04-19 LAB — COMPREHENSIVE METABOLIC PANEL
ALT: 52 U/L — ABNORMAL HIGH (ref 0–44)
AST: 32 U/L (ref 15–41)
Albumin: 2.7 g/dL — ABNORMAL LOW (ref 3.5–5.0)
Alkaline Phosphatase: 96 U/L (ref 38–126)
Anion gap: 12 (ref 5–15)
BUN: 17 mg/dL (ref 8–23)
CO2: 30 mmol/L (ref 22–32)
Calcium: 8.8 mg/dL — ABNORMAL LOW (ref 8.9–10.3)
Chloride: 93 mmol/L — ABNORMAL LOW (ref 98–111)
Creatinine, Ser: 0.77 mg/dL (ref 0.61–1.24)
GFR calc Af Amer: >60 mL/min (ref >60)
GFR calc non Af Amer: >60 mL/min (ref >60)
Glucose, Bld: 111 mg/dL — ABNORMAL HIGH (ref 70–99)
Potassium: 4.2 mmol/L (ref 3.5–5.1)
Sodium: 135 mmol/L (ref 135–145)
Total Bilirubin: 0.6 mg/dL (ref 0.3–1.2)
Total Protein: 6.5 g/dL (ref 6.5–8.1)

## 2019-04-19 NOTE — Progress Notes (Signed)
Occupational Therapy Session Note  Patient Details  Name: Dale Perez MRN: [id number] Date of Birth: Mar 07, 1955  Today's Date: 04/19/2019 OT Individual Time: 1300-1410 OT Individual Time Calculation (min): 70 min    Short Term Goals: Week 1:  OT Short Term Goal 1 (Week 1): STG = LTGs due to ELOS  Skilled Therapeutic Interventions/Progress Updates:    Upon entering the room, pt seated on recliner chair and wife present in room. Pt's wife asked to don face mask for therapist and pt safety. Pt on RA this session with O2 saturation remaining above 91% this session. OT educating caregiver on OT purpose, supervision level goals, and estimated LOS and she verbalized understanding. OT removed condom cath and pt agreeable to don clothing items from home. Pt required assistance to pull shirt down trunk with min cuing for technique to dress L UE first secondary to pain. Pt unable to demonstrate figure four for dressing tasks but later pulls L foot all the way up to seat and is observed to be sitting in this fashion. Pt needing assistance to thread L LE into pants and assist with balance as well as pulling over L hip with clothing management. Pt verbalized, " I can do all this myself" when reviewing goals and therapist described each task completed this far and how much help he needed with each one. Pt ambulating in hallway with therapist without use of AD at min A for 60' before returning back to room. Caregiver demonstrated ability to provide min A to ambulate pt into bathroom for toileting needs and was checked off for toilet transfers. OT notified RN. Pt returning to sit in recliner chair at end of session. All needs within reach and chair alarm activated.   Therapy Documentation Precautions:  Precautions Precautions: Fall, Other (comment) Precaution Comments: watch sats Restrictions Weight Bearing Restrictions: No General: General PT Missed Treatment Reason: Patient  fatigue ADL: ADL Grooming: Minimal assistance Where Assessed-Grooming: Standing at sink Lower Body Dressing: Maximal assistance Where Assessed-Lower Body Dressing: Chair Toileting: Minimal assistance Where Assessed-Toileting: Teacher, adult education: Curator Method: Proofreader: Raised toilet seat, Grab bars(BSC over toilet) ADL Comments: Pt declined bathing on eval due to pain Praxis Praxis: Intact   Therapy/Group: Individual Therapy  Alen Bleacher 04/19/2019, 4:03 PM
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
## 2019-04-19 NOTE — Care Management (Signed)
Inpatient Rehabilitation Center Individual Statement of Services  Patient Name:  Dale Perez  Date:  04/19/2019  Welcome to the Inpatient Rehabilitation Center.  Our goal is to provide you with an individualized program based on your diagnosis and situation, designed to meet your specific needs.  With this comprehensive rehabilitation program, you will be expected to participate in at least 3 hours of rehabilitation therapies Monday-Friday, with modified therapy programming on the weekends.  Your rehabilitation program will include the following services:  Physical Therapy (PT), Occupational Therapy (OT), 24 hour per day rehabilitation nursing, Therapeutic Recreaction (TR), Neuropsychology, Case Management (Social Worker), Rehabilitation Medicine, Nutrition Services and Pharmacy Services  Weekly team conferences will be held on Wednesdays to discuss your progress.  Your Social Automotive engineer will talk with you frequently to get your input and to update you on team discussions.  Team conferences with you and your family in attendance may also be held.  Expected length of stay: 5-7 days  Overall anticipated outcome: Modified Independent  Depending on your progress and recovery, your program may change. Your Social Automotive engineer will coordinate services and will keep you informed of any changes. Your Social Worker's/Care Manager's name and contact numbers are listed  below.  The following services may also be recommended but are not provided by the Inpatient Rehabilitation Center:    Home Health Rehabiltiation Services  Outpatient Rehabilitation Services   Arrangements will be made to provide these services after discharge if needed.  Arrangements include referral to agencies that provide these services.  Your insurance has been verified to be:  BCBS Your primary doctor is:  [facility]No PCP for a couple of years.  Pertinent information will be shared with your  doctor and your insurance company.  Care Manager: Chana Bode, RN (O) [phone] or [phone]  Social Worker:  Cecile Sheerer, SW [phone] or [phone]  Information discussed with and copy given to patient by: Pamelia Hoit, 04/19/2019, 10:22 AM

## 2019-04-19 NOTE — Progress Notes (Signed)
Inpatient Rehabilitation  Patient information reviewed and entered into eRehab system by Breckan Cafiero M. Kiari Hosmer, M.A., CCC/SLP, PPS Coordinator.  Information including medical coding, functional ability and quality indicators will be reviewed and updated through discharge.    

## 2019-04-19 NOTE — Progress Notes (Signed)
Inpatient Rehabilitation Medication Review by a Pharmacist  A complete drug regimen review was completed for this patient to identify any potential clinically significant medication issues.  Clinically significant medication issues were identified:  no  Name of provider notified: N/A  Method of Notification: N/A    Pharmacist comments: N/A  Time spent performing this drug regimen review (minutes):  15  Gerrit Halls, PharmD PGY1 Pharmacy Resident  04/19/2019 6:26 PM
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
## 2019-04-19 NOTE — Progress Notes (Addendum)
[facility] PHYSICAL MEDICINE & REHABILITATION PROGRESS NOTE   Subjective/Complaints:  Left sided upper chest and post rib pain with LUE movement as well as deep breathing  ROS- GU condom cath, + CP, +SOB with activity, neg N/V/D  Objective:   No results found. Recent Labs    04/18/19 1939 04/19/19 0516  WBC 9.0 9.4  HGB 14.2 13.4  HCT 44.0 40.9  PLT 458* 450*   Recent Labs    04/18/19 0437 04/18/19 0437 04/18/19 1939 04/19/19 0516  NA 135  --   --  135  K 4.0  --   --  4.2  CL 92*  --   --  93*  CO2 31  --   --  30  GLUCOSE 100*  --   --  111*  BUN 16  --   --  17  CREATININE 0.77   < > 0.88 0.77  CALCIUM 8.9  --   --  8.8*   < > = values in this interval not displayed.    Intake/Output Summary (Last 24 hours) at 04/19/2019 0906 Last data filed at 04/19/2019 0700 Gross per 24 hour  Intake 0 ml  Output 525 ml  Net -525 ml     Physical Exam: Vital Signs Blood pressure 119/70, pulse 71, temperature 98 F (36.7 C), temperature source Oral, resp. rate 19, height 6\' 1"  (1.854 m), weight 74.3 kg, SpO2 94 %.   General: No acute distress Mood and affect are appropriate Heart: Regular rate and rhythm no rubs murmurs or extra sounds Lungs: Clear to auscultation, breathing unlabored, no rales or wheezes Abdomen: Positive bowel sounds, soft nontender to palpation, nondistended Extremities: No clubbing, cyanosis, or edema Skin: No evidence of breakdown, no evidence of rash Neurologic: Cranial nerves II through XII intact, motor strength is 5/5 in Right , 4-/5 left  deltoid, bicep, tricep, grip,5/5 bilateral  hip flexor, knee extensors, ankle dorsiflexor and plantar flexor Sensory exam normal sensation to light touch and proprioception in bilateral upper and lower extremities Cerebellar exam normal finger to nose to finger as well as heel to shin in bilateral upper and lower extremities Musculoskeletal: Full range of motion in all 4 extremities. No joint  swelling   Assessment/Plan: 1. Functional deficits secondary to Multiple left sided rib fractures  which require 3+ hours per day of interdisciplinary therapy in a comprehensive inpatient rehab setting.  Physiatrist is providing close team supervision and 24 hour management of active medical problems listed below.  Physiatrist and rehab team continue to assess barriers to discharge/monitor patient progress toward functional and medical goals  Care Tool:  Bathing              Bathing assist       Upper Body Dressing/Undressing Upper body dressing   What is the patient wearing?: Hospital gown only    Upper body assist      Lower Body Dressing/Undressing Lower body dressing      What is the patient wearing?: Hospital gown only     Lower body assist       Toileting Toileting    Toileting assist Assist for toileting: Moderate Assistance - Patient 50 - 74%     Transfers Chair/bed transfer  Transfers assist           Locomotion Ambulation   Ambulation assist              Walk 10 feet activity   Assist  Walk 50 feet activity   Assist           Walk 150 feet activity   Assist           Walk 10 feet on uneven surface  activity   Assist           Wheelchair     Assist               Wheelchair 50 feet with 2 turns activity    Assist            Wheelchair 150 feet activity     Assist          Blood pressure 119/70, pulse 71, temperature 98 F (36.7 C), temperature source Oral, resp. rate 19, height 6\' 1"  (1.854 m), weight 74.3 kg, SpO2 94 %.      Medical Problem List and Plan: 1.Decreased functional mobilitysecondary to fall/concussion/multiple rib fractures with occult pneumothorax/possible subcortical impaction fracture of T8. Conservative care -patient may  shower -ELOS/Goals: 7-10d CIR PT, OT evals today , team conf in am  2.  Antithrombotics: -DVT/anticoagulation:Lovenox initiated 04/10/2019. Check vascular study -antiplatelet therapy: N/A 3. Pain Management:Tramadol 100 mg every 6 hours, Neurontin 100 mg 3 times daily, Robaxin 1000 mg 3 times daily, oxycodone as needed Has pain with deep breathing, may also desat due to this, may need some increased rest breaks with PT, OT  4. Mood:Provide emotional support -antipsychotic agents: N/A 5. Neuropsych: This patientiscapable of making decisions on hisown behalf. 6. Skin/Wound Care:Routine skin checks 7. Fluids/Electrolytes/Nutrition:Routine in and outs with follow-up chemistries 8. Tobacco abuse/pneumonia. NicoDerm patch. Provide counseling. Complete course of Omnicef. Checkcurrently day 3/10. Oxygen saturations every shift 9. Transient episode atrial fibrillation. Resolved. Cardiac rate controlled 10. Constipation. Laxative assistance.  LOS: 1 days A FACE TO FACE EVALUATION WAS PERFORMED  5/10 04/19/2019, 9:06 AM

## 2019-04-19 NOTE — Plan of Care (Signed)
  Problem: Consults Goal: RH GENERAL PATIENT EDUCATION Description: See Patient Education module for education specifics. Outcome: Progressing   Problem: RH BOWEL ELIMINATION Goal: RH STG MANAGE BOWEL W/MEDICATION W/ASSISTANCE Description: STG Manage Bowel with Medication with mod Assistance. Outcome: Progressing   Problem: RH BLADDER ELIMINATION Goal: RH STG MANAGE BLADDER WITH ASSISTANCE Description: STG Manage Bladder With mod Assistance Outcome: Progressing   Problem: RH KNOWLEDGE DEFICIT GENERAL Goal: RH STG INCREASE KNOWLEDGE OF SELF CARE AFTER HOSPITALIZATION Description: Patient and caregiver will demonstrate knowledge of of medication management, splinting ribs while coughing, pain management, and follow up care with the MD post discharge with min assist from staff. Outcome: Progressing

## 2019-04-19 NOTE — Progress Notes (Signed)
Bilateral lower extremity venous duplex complete.  Please see CV Proc tab for preliminary results. Levin Bacon- RDMS, RVT 5:18 PM  04/19/2019

## 2019-04-19 NOTE — Progress Notes (Signed)
PMR Admission Coordinator Pre-Admission Assessment   Patient: Dale Perez is an 64 y.o., male MRN: [id number] DOB: 04-30-1955 Height: 6' 2" (188 cm) Weight: 74.3 kg   Insurance Information HMO:     PPO: yes     PCP:      IPA:      80/20:      OTHER:  PRIMARY: [place] for [employer]      Policy#: [id number]      Subscriber: patient CM Name: Fredderick Phenix      Phone#: [phone] option 2     Fax#: [phone] Pre-Cert#: [id number] [place] for CIR admission on 3/15 provided by Lattie Haw with [facility] with updates due to fax listed above on 3/21      Employer: [employer] Benefits:  Phone #: [phone] > option 3 > option 2     Name:  Eff. Date: 04/15/2019     Deduct: $300 (met)      Out of Pocket Max: n/a      Life Max: n/a CIR: 100%      SNF: 100% (limit 70 days) Outpatient: 100%     Co-Pay:  Home Health: 100%      Limits: 80 visits, 4 hours=1 visit DME: 100% when ordered by a MD and in PPO network    Providers:  SECONDARY:       Policy#:       Subscriber:  CM Name:       Phone#:      Fax#:  Pre-Cert#:       Employer:  Benefits:  Phone #:      Name:  Eff. Date:      Deduct:       Out of Pocket Max:       Life Max:  CIR:       SNF:  Outpatient:      Co-Pay:  Home Health:       Co-Pay:  DME:      Co-Pay:    Medicaid Application Date:       Case Manager:  Disability Application Date:       Case Worker:    The "Data Collection Information Summary" for patients in Inpatient Rehabilitation Facilities with attached "Privacy Act [place] Records" was provided and verbally reviewed with: N/A   Emergency Contact Information         Contact Information     Name Relation Home Work Mobile    [place], [place]     [phone]         Current Medical History  Patient Admitting Diagnosis: debility and respiratory failure 2/2 multiple rib fractures, concussion   History of Present Illness: Dale Perez is a 64 year old right-handed male with history of  tobacco abuse on no prescription medications.  Presented 04/09/2019 after a fall from a ladder approximately 20 feet while cutting down a tree limb.  There was reported brief loss of consciousness.  Admission chemistries unremarkable, WBC 10,600, alcohol negative, lactic acid 2.1.  Cranial CT scan as well as CT cervical spine showed no acute intracranial process.  No acute fracture or traumatic listhesis of the cervical spine.  CT of the chest abdomen and pelvis showed posterior left fourth through 12th minimally displaced rib fractures.  Additional anterior left second through sixth rib fracture.  Small left apical and medial pneumothorax and trace pleural thickening likely hemothorax.  Subtle sclerotic band subadjacent to the superior endplate cortex at T8 with some mild anterior wedging possibly reflecting  subcortical impaction fracture.  Incidental findings of 4.5 cm cyst within the upper pole of the right kidney as well as 4.4 cm AAA.  Hospital course pain management.  Noted episode of atrial fibrillation 04/13/2019 resolved after receiving Lopressor which is since been discontinued.  Strep PNA and currently maintained on Omnicef with latest chest x-ray 04/11/2019 showing no acute pulmonary process and patient is being weaned from oxygen therapy.  In regards to incidental findings of 4.4 cm infrarenal abdominal aortic aneurysm follow-up Dr. Carlis Abbott vascular surgery as outpatient.  Subcutaneous Lovenox added for DVT prophylaxis.  Therapy evaluations completed and patient was recommended for a comprehensive rehab program.   Patient's medical record from [facility] has been reviewed by the rehabilitation admission coordinator and physician.   Past Medical History  History reviewed. No pertinent past medical history.   Family History   family history is not on file.   Prior Rehab/Hospitalizations Has the patient had prior rehab or hospitalizations prior to admission? No   Has the patient had major  surgery during 100 days prior to admission? No               Current Medications   Current Facility-Administered Medications:  .  acetaminophen (TYLENOL) tablet 650 mg, 650 mg, Oral, Q6H, Georganna Skeans, MD, 650 mg at 04/18/19 1322 .  bisacodyl (DULCOLAX) suppository 10 mg, 10 mg, Rectal, Daily PRN, Jillyn Ledger, PA-C, 10 mg at 04/16/19 0831 .  cefdinir (OMNICEF) capsule 300 mg, 300 mg, Oral, Q12H, Jillyn Ledger, PA-C, 300 mg at 04/18/19 1321 .  docusate sodium (COLACE) capsule 100 mg, 100 mg, Oral, BID, Jillyn Ledger, PA-C, 100 mg at 04/18/19 0859 .  enoxaparin (LOVENOX) injection 40 mg, 40 mg, Subcutaneous, Q24H, Georganna Skeans, MD, 40 mg at 04/18/19 0900 .  gabapentin (NEURONTIN) capsule 100 mg, 100 mg, Oral, TID, Georganna Skeans, MD, 100 mg at 04/18/19 0859 .  HYDROmorphone (DILAUDID) injection 1 mg, 1 mg, Intravenous, Q2H PRN, Georganna Skeans, MD, 1 mg at 04/15/19 2255 .  ipratropium-albuterol (DUONEB) 0.5-2.5 (3) MG/3ML nebulizer solution 3 mL, 3 mL, Nebulization, Q6H PRN, Rolm Bookbinder, MD .  methocarbamol (ROBAXIN) tablet 1,000 mg, 1,000 mg, Oral, TID, Jillyn Ledger, PA-C, 1,000 mg at 04/18/19 0859 .  metoprolol tartrate (LOPRESSOR) injection 5 mg, 5 mg, Intravenous, Q6H PRN, Romana Juniper A, MD, 5 mg at 04/12/19 0146 .  nicotine (NICODERM CQ - dosed in mg/24 hours) patch 21 mg, 21 mg, Transdermal, Daily, Georganna Skeans, MD, 21 mg at 04/18/19 0903 .  ondansetron (ZOFRAN-ODT) disintegrating tablet 4 mg, 4 mg, Oral, Q6H PRN **OR** ondansetron (ZOFRAN) injection 4 mg, 4 mg, Intravenous, Q6H PRN, Georganna Skeans, MD .  oxyCODONE (Oxy IR/ROXICODONE) immediate release tablet 10 mg, 10 mg, Oral, Q4H PRN, Georganna Skeans, MD, 10 mg at 04/18/19 0859 .  oxyCODONE (Oxy IR/ROXICODONE) immediate release tablet 5 mg, 5 mg, Oral, Q4H PRN, Georganna Skeans, MD .  polyethylene glycol (MIRALAX / GLYCOLAX) packet 17 g, 17 g, Oral, Daily, Jillyn Ledger, PA-C, 17 g at 04/18/19  0859 .  polyethylene glycol (MIRALAX / GLYCOLAX) packet 17 g, 17 g, Oral, Once, Rolm Bookbinder, MD .  simethicone [facility]) chewable tablet 80 mg, 80 mg, Oral, Q6H PRN, Clovis Riley, MD, 80 mg at 04/15/19 2000 .  traMADol (ULTRAM) tablet 100 mg, 100 mg, Oral, Q6H, Georganna Skeans, MD, 100 mg at 04/18/19 1322   Patients Current Diet:     Diet Order  Diet regular Room service appropriate? Yes; Fluid consistency: Thin  Diet effective now                   Precautions / Restrictions Precautions Precautions: Fall, Other (comment) Precaution Comments: watch sats Restrictions Weight Bearing Restrictions: No    Has the patient had 2 or more falls or a fall with injury in the past year? Yes, the fall that led to this admission.    Prior Activity Level Community (5-7x/wk): had been on FMLA but was planning to return on 3/23, no device for ambulation, driving   Prior Functional Level Self Care: Did the patient need help bathing, dressing, using the toilet or eating? Independent   Indoor Mobility: Did the patient need assistance with walking from room to room (with or without device)? Independent   Stairs: Did the patient need assistance with internal or external stairs (with or without device)? Independent   Functional Cognition: Did the patient need help planning regular tasks such as shopping or remembering to take medications? Independent   Home Assistive Devices / Equipment Home Assistive Devices/Equipment: Eyeglasses, Dentures (specify type) Home Equipment: Walker - 2 wheels(pt reports given to him on 51th birthday)   Prior Device Use: Indicate devices/aids used by the patient prior to current illness, exacerbation or injury? None of the above   Current Functional Level Cognition   Overall Cognitive Status: Impaired/Different from baseline Current Attention Level: Selective Orientation Level: Oriented X4 Following Commands: Follows multi-step  commands inconsistently, Follows multi-step commands with increased time, Follows one step commands consistently General Comments: Easily frustrated. Internally distracted. flat affect. anxious. Slow/delayed processing. Needs encouragment to mobilize despite pain.  [place] Scales of Cognitive Functioning: Automatic/appropriate    Extremity Assessment (includes Sensation/Coordination)   Upper Extremity Assessment: Generalized weakness RUE Deficits / Details: difficult to fullly assess strength of bil. UEs and full AROM Of shoulders due to rib pain  LUE Deficits / Details: guarded  Lower Extremity Assessment: Defer to PT evaluation     ADLs   Overall ADL's : Needs assistance/impaired Eating/Feeding: Set up, Sitting Grooming: Wash/dry face, Wash/dry hands, Oral care, Minimal assistance, Sitting Upper Body Bathing: Moderate assistance, Sitting Lower Body Bathing: Maximal assistance, Sit to/from stand Upper Body Dressing : Moderate assistance, Sitting Lower Body Dressing: Total assistance, Sit to/from stand Toilet Transfer: Minimal assistance, Stand-pivot, RW Toilet Transfer Details (indicate cue type and reason): Min A for balance and safety.  Toileting- Clothing Manipulation and Hygiene: Moderate assistance, Sit to/from stand Functional mobility during ADLs: Minimal assistance, Rolling walker(stand pivot) General ADL Comments: Pt completed bed mobility, stood EOB and sidestepped and walked in place with encouragement to mobilize despite pain. Pt then pivoted to recliner     Mobility   Overal bed mobility: Needs Assistance Bed Mobility: Supine to Sit Supine to sit: HOB elevated, Min guard(with rail) Sit to supine: Min assist, HOB elevated General bed mobility comments: incr time and cues for sequencing; exit to his right; allowing HOB elevated, use of rail as he will use recliner at home     Transfers   Overall transfer level: Needs assistance Equipment used: Rolling walker (2  wheeled) Transfers: Sit to/from Stand Sit to Stand: Min assist Stand pivot transfers: Min assist General transfer comment: cues for technique and to move through the pain; assist to steady RW as he comes to stand; increased time to complete task     Ambulation / Gait / Stairs / Wheelchair Mobility   Ambulation/Gait Ambulation/Gait assistance: [employer]  guard Gait Distance (Feet): 160 Feet Assistive device: Rolling walker (2 wheeled) Gait Pattern/deviations: Decreased stride length, Step-through pattern, Trunk flexed General Gait Details: Min guard for safety with lines, cues for pursed lip breathing to maintain sats >88%, tendency for downward gaze, decreased speed Gait velocity: very slow, disctracted by pain Gait velocity interpretation: <1.8 ft/sec, indicate of risk for recurrent falls Stairs: (began to discuss 5 STE home, pt too fatigued with decr sats)     Posture / Balance Dynamic Sitting Balance Sitting balance - Comments: pt could not tolerated sitting without use of RUE Balance Overall balance assessment: Needs assistance Sitting-balance support: Feet supported, Single extremity supported Sitting balance-Leahy Scale: Fair Sitting balance - Comments: pt could not tolerated sitting without use of RUE Postural control: Right lateral lean Standing balance support: No upper extremity supported Standing balance-Leahy Scale: Fair Standing balance comment: Able to maintain static standing without UE support     Special needs/care consideration BiPAP/CPAP no CPM no Continuous Drip IV no Dialysis no        Days n/a Life Vest no Oxygen 2L *has been requiring up to 5L for mobility but is weaning well Special Bed no Trach Size no Wound Vac (area) no      Location n/a Skin                               Location  Bowel mgmt: continent Bladder mgmt: continent Diabetic mgmt: no Behavioral consideration no Chemo/radiation no    Previous Home Environment (from acute therapy  documentation) Living Arrangements: Spouse/significant other Available Help at Discharge: Family, Available 24 hours/day Type of Home: House Home Layout: One level Home Access: Stairs to enter Entrance Stairs-Rails: Can reach both Entrance Stairs-Number of Steps: 5 Bathroom Shower/Tub: Tub/shower unit, Architectural technologist: Standard(bathroom he uses; other has handicap ) Home Care Services: No   Discharge Living Setting Plans for Discharge Living Setting: Patient's home Type of Home at Discharge: House Discharge Home Layout: One level Discharge Home Access: Stairs to enter Entrance Stairs-Rails: Can reach both Entrance Stairs-Number of Steps: 5 Discharge Bathroom Shower/Tub: Tub/shower unit Discharge Bathroom Toilet: Standard Discharge Bathroom Accessibility: Yes How Accessible: Accessible via walker Does the patient have any problems obtaining your medications?: No   Social/Family/Support Systems Patient Roles: Spouse Anticipated Caregiver: Tristan Proto Anticipated Caregiver's Contact Information: [phone] Ability/Limitations of Caregiver: works from home, supervision only Caregiver Availability: 24/7 Discharge Plan Discussed with Primary Caregiver: Yes Is Caregiver In Agreement with Plan?: Yes Does Caregiver/Family have Issues with Lodging/Transportation while Pt is in Rehab?: No   Goals/Additional Needs Patient/Family Goal for Rehab: PT/OT mod I, SLP n/a Expected length of stay: 3-5 days Dietary Needs: reg/thin Pt/Family Agrees to Admission and willing to participate: Yes Program Orientation Provided & Reviewed with Pt/Caregiver Including Roles  & Responsibilities: Yes   Decrease burden of Care through IP rehab admission: n/a   Possible need for SNF placement upon discharge: Not anticipated.    Patient Condition: I have reviewed medical records from [facility], spoken with CM, and patient and spouse. I met with patient at the bedside for inpatient  rehabilitation assessment.  Patient will benefit from ongoing PT and OT, can actively participate in 3 hours of therapy a day 5 days of the week, and can make measurable gains during the admission.  Patient will also benefit from the coordinated team approach during an Inpatient Acute Rehabilitation admission.  The patient will receive intensive  therapy as well as Rehabilitation physician, nursing, social worker, and care management interventions.  Due to safety, medication administration, pain management and patient education the patient requires 24 hour a day rehabilitation nursing.  The patient is currently min assist with mobility and basic ADLs.  Discharge setting and therapy post discharge at home is anticipated.  Patient has agreed to participate in the Acute Inpatient Rehabilitation Program and will admit today.   Preadmission Screen Completed By:  Michel Santee, PT, DPT 04/18/2019 3:26 PM ______________________________________________________________________   Discussed status with Dr. Letta Pate on 04/18/19  at 3:38 PM  and received approval for admission today.   Admission Coordinator:  Michel Santee, PT, DPT time 3:38 PM Sudie Grumbling 04/18/19     Assessment/Plan: Diagnosis:  Multiple rib fractures due to fall with impaired mobility and self care skills 1. Does the need for close, 24 hr/day Medical supervision in concert with the patient's rehab needs make it unreasonable for this patient to be served in a less intensive setting? Yes 2. Co-Morbidities requiring supervision/potential complications: Pneumonia, hypoxia due to rib fracture 3. Due to bladder management, bowel management, safety, skin/wound care, disease management, medication administration, pain management and patient education, does the patient require 24 hr/day rehab nursing? Yes 4. Does the patient require coordinated care of a physician, rehab nurse, PT, OT, and SLP to address physical and functional deficits in the context of  the above medical diagnosis(es)? Yes Addressing deficits in the following areas: balance, endurance, locomotion, strength, transferring, bowel/bladder control, bathing, dressing, toileting and psychosocial support 5. Can the patient actively participate in an intensive therapy program of at least 3 hrs of therapy 5 days a week? Yes 6. The potential for patient to make measurable gains while on inpatient rehab is good 7. Anticipated functional outcomes upon discharge from inpatient rehab: modified independent PT, supervision OT, n/a SLP 8. Estimated rehab length of stay to reach the above functional goals is: 5-7d 9. Anticipated discharge destination: Home 10. Overall Rehab/Functional Prognosis: good     MD Signature: Charlett Blake M.D. [facility] FAAPM&R (Neuromuscular Med) Diplomate Am Board of Electrodiagnostic Med Fellow Am Board of Interventional Pain

## 2019-04-19 NOTE — Evaluation (Signed)
Physical Therapy Assessment and Plan  Patient Details  Name: Dale Perez MRN: [id number] Date of Birth: 08-Feb-1955  PT Diagnosis: Difficulty walking, Pain in rib case, Decreased functional endurance Rehab Potential: Good ELOS: 7-10 days   Today's Date: 04/19/2019 PT Individual Time: 1100-1145 PT Individual Time Calculation (min): 45 min    Problem List:  Patient Active Problem List   Diagnosis Date Noted  . Debility 04/18/2019  . Left rib fracture 04/09/2019    Past Medical History: History reviewed. No pertinent past medical history. Past Surgical History:  Past Surgical History:  Procedure Laterality Date  . APPENDECTOMY    . I & D EXTREMITY Left 08/10/2015   Procedure: IRRIGATION AND DEBRIDEMENT EXTREMITY;  Surgeon: Milly Jakob, MD;  Location: [facility];  Service: Orthopedics;  Laterality: Left;  . KNEE CARTILAGE SURGERY Right 02/2019  . KNEE SURGERY    . SHOULDER ARTHROSCOPY Left 08/2018  . SHOULDER SURGERY    . TENDON REPAIR N/A 08/10/2015   Procedure: TENDON REPAIR WITH WOUND CLOSURE;  Surgeon: Milly Jakob, MD;  Location: [facility];  Service: Orthopedics;  Laterality: N/A;  . TONSILLECTOMY      Assessment & Plan Clinical Impression: [name] with history of tobacco abuse on no prescription medications. Presented 04/09/2019 after a fall from a ladder approximately 20 feet while cutting down a tree limb. There was reported brief loss of consciousness. Admission chemistries unremarkable, WBC 10,600, alcohol negative, lactic acid 2.1. Cranial CT scan as well as CT cervical spine showed no acute intracranial process. No acute fracture or traumatic listhesis of the cervical spine. CT of the chest abdomen and pelvis showed posterior left fourth through 12th minimally displaced rib fractures. Additional anterior left second through sixth rib fracture. Small left apical and medial pneumothorax and trace pleural thickening likely hemothorax.  Subtle sclerotic band subadjacent to the superior endplate cortex at T8 with some mild anterior wedging possibly reflecting subcortical impaction fracture. Incidental findings of 4.5 cm cyst within the upper pole of the right kidney as well as 4.4 cm AAA. Hospital course pain management. Noted episode of atrial fibrillation 04/13/2019 resolved after receiving Lopressor which is since been discontinued. Strep PNAand currently maintained on Omnicef with latest chest x-ray 04/11/2019 showing no acute pulmonary process and patient is being weaned from oxygen therapy. In regards to incidental findings of 4.4 cm infrarenal abdominal aortic aneurysm follow-up Dr. Carlis Abbott vascular surgery as outpatient. Subcutaneous Lovenox added for DVT prophylaxis. Patient transferred to CIR on 04/18/2019 .   Patient currently requires supervision with mobility secondary to muscle joint tightness, decreased cardiorespiratoy endurance and decreased oxygen support and decreased sitting balance, decreased standing balance and decreased balance strategies.  Prior to hospitalization, patient was independent  with mobility and lived with Spouse in a House home.  Home access is 5Stairs to enter.  Patient will benefit from skilled PT intervention to maximize safe functional mobility, minimize fall risk and decrease caregiver burden for planned discharge home with 24 hour supervision.  Anticipate patient will benefit from follow up [place] at discharge.  PT - End of Session Activity Tolerance: Tolerates < 10 min activity, no significant change in vital signs Endurance Deficit: Yes Endurance Deficit Description: decreased, increased work of breathing w/ all activity PT Assessment Rehab Potential (ACUTE/IP ONLY): Good PT Barriers to Discharge: Medical stability;New oxygen PT Patient demonstrates impairments in the following area(s): Balance;Endurance;Safety;Pain PT Transfers Functional Problem(s): Floor;Bed to Chair;Car;Furniture PT  Locomotion Functional Problem(s): Ambulation;Stairs PT Plan PT Intensity: Minimum  of 1-2 x/day ,45 to 90 minutes PT Frequency: 5 out of 7 days PT Duration Estimated Length of Stay: 7-10 days PT Treatment/Interventions: UE/LE Strength taining/ROM;Therapeutic Activities;Splinting/orthotics;Psychosocial support;Pain management;Functional mobility training;DME/adaptive equipment instruction;Discharge planning;Ambulation/gait training;Balance/vestibular training;Community reintegration;Disease management/prevention;Functional electrical stimulation;Neuromuscular re-education;Patient/family education;Skin care/wound management;Stair training;Therapeutic Exercise;UE/LE Coordination activities PT Transfers Anticipated Outcome(s): mod/i PT Locomotion Anticipated Outcome(s): mod/i household gait, supervision community gait PT Recommendation Follow Up Recommendations: Home health PT Patient destination: Home Equipment Recommended: To be determined  Skilled Therapeutic Intervention  Pt received in recliner, agreeable to therapy, reports pain and stiffness in rib cage and across chest. Pt does not rate and c/o increased pain w/ all mobility, resolves w/ rest. Sit>stand w/ CGA and ambulated 125' w/ RW and CGA. Seated rest break, pt on 1 L/min O2 and spO2 >96%. Total assist remainder of way to therapy gym 2/2 fatigue and for energy conservation. Negotiated 4 steps w/ B rails w/ CGA. Attempted to perform car transfer, however pt ultimately refusing 2/2 car seat being too low (car simulator unable to raise). Discussed options pt has for d/c, pt states wife has small SUV that he can use for a higher car seated 2/2 pain in rib cage from standing at a low surface. Returned to room and ambulated 40' w/ min assist w/o AD to recliner. Pt declined attempting bed mobility 2/2 rib pain, states he would like to avoid performing task when not absolutely necessary. Instructed pt and wife in results of PT evaluation as detailed  below, PT POC, rehab potential, rehab goals, and discharge recommendations. Ended session in recliner, all needs in reach. Missed 15 min of skilled PT 2/2 fatigue/rib pain.   Pt did not use O2 prior to admission. Removed O2 and spO2 was >93% on room air at rest. Pt agreeable to don supplemental O2 should he feel SOB. Made RN aware who is in agreement for pt to trial room air at rest.   PT Evaluation Precautions/Restrictions Precautions Precautions: Fall;Other (comment) Precaution Comments: watch sats Restrictions Weight Bearing Restrictions: No Home Living/Prior Functioning Home Living Living Arrangements: Spouse/significant other Available Help at Discharge: Family;Available 24 hours/day Type of Home: House Home Access: Stairs to enter [employer] of Steps: 5 Entrance Stairs-Rails: Can reach both Home Layout: One level Bathroom Shower/Tub: Product/process development scientist: Standard  Lives With: Spouse Prior Function Level of Independence: Independent with basic ADLs;Independent with homemaking with ambulation;Independent with gait;Independent with transfers  Able to Take Stairs?: Yes Driving: Yes Vocation: Full time employment Vocation Requirements: Was currently on FMLA/worker's comp 2/2 accident, 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 Comments: has been on [employer] due to elevator accident required knee and shoulder surgery; was due to go back to work 04/26/19 Vision/Perception  Perception Perception: Within Functional Limits Praxis Praxis: Intact  Cognition Overall Cognitive Status: Impaired/Different from baseline Arousal/Alertness: Awake/alert Attention: Selective Selective Attention: Appears intact Immediate Memory Recall: Sock;Blue;Bed Memory Recall Sock: Without Cue Memory Recall Blue: Without Cue Memory Recall Bed: With Cue Awareness: Appears intact Problem Solving: Appears intact Behaviors: Verbal  agitation Safety/Judgment: Appears intact [employer] Scales of Cognitive Functioning: Automatic/appropriate Sensation Sensation Light Touch: Appears Intact Proprioception: Appears Intact Coordination Gross Motor Movements are Fluid and Coordinated: No Finger Nose Finger Test: limited on Lt, mostly by rib pain Motor  Motor Motor: Within Functional Limits Motor - Skilled Clinical Observations: generalized weakness  Mobility Transfers Transfers: Sit to Stand;Stand to Sit;Stand Pivot Transfers Sit to Stand: Contact Guard/Touching assist Stand to Sit: Contact  Guard/Touching assist Stand Pivot Transfers: Contact Guard/Touching assist Transfer (Assistive device): None Locomotion  Gait Ambulation: Yes Gait Assistance: Minimal Assistance - Patient > 75%(CGA w/ RW x125') Gait Distance (Feet): 40 Feet Assistive device: None Gait Gait velocity: very slow, guarding 2/2 rib pain Stairs / Additional Locomotion Stairs: Yes Stairs Assistance: Contact Guard/Touching assist Stair Management Technique: Two rails Number of Stairs: 4 Height of Stairs: 6 Wheelchair Mobility Wheelchair Mobility: No  Trunk/Postural Assessment  Cervical Assessment Cervical Assessment: Within Functional Limits Thoracic Assessment Thoracic Assessment: Exceptions to WFL(stiffness 2/2 pain guarding) Lumbar Assessment Lumbar Assessment: Exceptions to WFL(stiffness 2/2 pain guarding) Postural Control Postural Control: Within Functional Limits  Balance Balance Balance Assessed: Yes Static Sitting Balance Static Sitting - Level of Assistance: 5: Stand by assistance Dynamic Sitting Balance Dynamic Sitting - Level of Assistance: 5: Stand by assistance Static Standing Balance Static Standing - Level of Assistance: 5: Stand by assistance(CGA) Dynamic Standing Balance Dynamic Standing - Level of Assistance: 4: Min assist Extremity Assessment  RLE Assessment RLE Assessment: Within Functional Limits(appears  WNL, pt w/ pain in rib cage with resisted LE movements) LLE Assessment LLE Assessment: Within Functional Limits(appears WNL, pt w/ pain in rib cage with resisted LE movements)    Refer to Care Plan for Long Term Goals  Recommendations for other services: None   Discharge Criteria: Patient will be discharged from PT if patient refuses treatment 3 consecutive times without medical reason, if treatment goals not met, if there is a change in medical status, if patient makes no progress towards goals or if patient is discharged from hospital.  The above assessment, treatment plan, treatment alternatives and goals were discussed and mutually agreed upon: by patient and by family  Deryck Hippler Clent Demark 04/19/2019, 12:27 PM

## 2019-04-20 ENCOUNTER — Encounter: Payer: BC Managed Care – PPO | Admitting: Vascular Surgery

## 2019-04-20 ENCOUNTER — Inpatient Hospital Stay (HOSPITAL_COMMUNITY): Payer: Self-pay | Admitting: Occupational Therapy

## 2019-04-20 ENCOUNTER — Inpatient Hospital Stay (HOSPITAL_COMMUNITY): Payer: Self-pay | Admitting: Physical Therapy

## 2019-04-20 MED ORDER — LIDOCAINE 5 % EX PTCH
2.0000 | MEDICATED_PATCH | Freq: Every day | CUTANEOUS | Status: DC
  Administered 2019-04-20 – 2019-04-22 (×3): 2 via TRANSDERMAL
  Filled 2019-04-20 (×3): qty 2

## 2019-04-20 NOTE — IPOC Note (Signed)
Overall Plan of Care [facility]) Patient Details Name: Dale Perez MRN: [id number] DOB: 1955/12/31  Admitting Diagnosis: Left rib fracture  Hospital Problems: Principal Problem:   Left rib fracture Active Problems:   Debility     Functional Problem List: Nursing Bladder, Edema, Endurance, Medication Management, Pain, Safety  PT Balance, Endurance, Safety, Pain  OT Balance, Endurance, Motor, Pain, Safety  SLP    TR         Basic ADL's: OT Grooming, Bathing, Dressing, Toileting     Advanced  ADL's: OT Light Housekeeping     Transfers: PT Floor, Bed to Chair, Car, Furniture, [employer] Mobility  OT Toilet, Tub/Shower     Locomotion: PT Ambulation, Stairs     Additional Impairments: OT None  SLP        TR      Anticipated Outcomes Item Anticipated Outcome  Self Feeding    Swallowing      Basic self-care  Supervision/setup  Toileting  Mod I   Bathroom Transfers Mod I toilet, Supervision shower  Bowel/Bladder  Min assist and maintain continence  Transfers  mod/i  Locomotion  mod/i household gait, supervision community gait  Communication     Cognition     Pain  <4 on a 0-10 pain  Safety/Judgment  patient will continue to adhere to all safety pre cautions   Therapy Plan: PT Intensity: Minimum of 1-2 x/day ,45 to 90 minutes PT Frequency: 5 out of 7 days PT Duration Estimated Length of Stay: 7-10 days OT Intensity: Minimum of 1-2 x/day, 45 to 90 minutes OT Frequency: 5 out of 7 days OT Duration/Estimated Length of Stay: 7-10 days     Due to the current state of emergency, patients may not be receiving their 3-hours of Medicare-mandated therapy.   Team Interventions: Nursing Interventions Patient/Family Education, Bladder Management, Disease Management/Prevention, Discharge Planning, Pain Management, Psychosocial Support, Medication Management  PT interventions UE/LE Strength taining/ROM, Therapeutic Activities, Splinting/orthotics, Psychosocial  support, Pain management, Functional mobility training, DME/adaptive equipment instruction, Discharge planning, Ambulation/gait training, Warden/ranger, Community reintegration, Disease management/prevention, Functional electrical stimulation, Neuromuscular re-education, Patient/family education, Skin care/wound management, Stair training, Therapeutic Exercise, UE/LE Coordination activities  OT Interventions Warden/ranger, Discharge planning, Disease mangement/prevention, DME/adaptive equipment instruction, Functional mobility training, Pain management, Patient/family education, Psychosocial support, Self Care/advanced ADL retraining, Skin care/wound managment, Therapeutic Activities, Therapeutic Exercise, UE/LE Coordination activities  SLP Interventions    TR Interventions    SW/CM Interventions Discharge Planning, Psychosocial Support, Patient/Family Education, Disease Management/Prevention   Barriers to Discharge MD  Medical stability  Nursing Inaccessible home environment, Decreased caregiver support, Lack of/limited family support, Behavior, Medical stability, Home environment access/layout, Incontinence, Medication compliance, Weight bearing restrictions    PT Medical stability, New oxygen    OT      SLP      SW Decreased caregiver support, Home environment access/layout 1 level home with 5 step entry; wife working remotely temporarily   Team Discharge Planning: Destination: PT-Home ,OT- Home , SLP-  Projected Follow-up: PT-Home health PT, OT-  Home health OT, SLP-  Projected Equipment Needs: PT-To be determined, OT- Tub/shower bench, 3 in 1 bedside comode, SLP-  Equipment Details: PT- , OT-  Patient/family involved in discharge planning: PT- Patient, Family member/caregiver,  OT-Patient, SLP-   MD ELOS: 3-5d Medical Rehab Prognosis:  Excellent Assessment:  64 year old right-handed male with history of tobacco abuse on no prescription medications. Per  chart review patient lives with spouse. Independent prior to admission. 1 level home 5  steps to entry. By report patient had been on Worker's Comp. due to a elevator accident required knee and shoulder surgery was scheduled to go back to work 04/26/2019. Presented 04/09/2019 after a fall from a ladder approximately 20 feet while cutting down a tree limb. There was reported brief loss of consciousness. Admission chemistries unremarkable, WBC 10,600, alcohol negative, lactic acid 2.1. Cranial CT scan as well as CT cervical spine showed no acute intracranial process. No acute fracture or traumatic listhesis of the cervical spine. CT of the chest abdomen and pelvis showed posterior left fourth through 12th minimally displaced rib fractures. Additional anterior left second through sixth rib fracture. Small left apical and medial pneumothorax and trace pleural thickening likely hemothorax. Subtle sclerotic band subadjacent to the superior endplate cortex at T8 with some mild anterior wedging possibly reflecting subcortical impaction fracture. Incidental findings of 4.5 cm cyst within the upper pole of the right kidney as well as 4.4 cm AAA. Hospital course pain management. Noted episode of atrial fibrillation 04/13/2019 resolved after receiving Lopressor which is since been discontinued. Strep PNAand currently maintained on Omnicef with latest chest x-ray 04/11/2019 showing no acute pulmonary process and patient is being weaned from oxygen therapy. In regards to incidental findings of 4.4 cm infrarenal abdominal aortic aneurysm follow-up Dr. Carlis Abbott vascular surgery as outpatient. Subcutaneous Lovenox added for DVT prophylaxis. Therapy evaluations completed and patient was admitted for a comprehensive rehab program.    Now requiring 24/7 Rehab RN,MD, as well as CIR level PT, OT and SLP.  Treatment team will focus on ADLs and mobility with goals set at [facility] Notes for weekly updates to  the plan of care

## 2019-04-20 NOTE — Progress Notes (Signed)
[facility] PHYSICAL MEDICINE & REHABILITATION PROGRESS NOTE   Subjective/Complaints:  Left sided upper chest and post rib pain with LUE movement as well as deep breathing Loose stools yesterday   ROS- GU condom cath, + CP, +SOB with activity, neg N/V/D  Objective:   VAS [place] LOWER EXTREMITY VENOUS (DVT)  Result Date: 04/19/2019  Lower Venous DVTStudy Indications: Swelling.  Performing Technologist: Antonieta Pert RDMS, RVT  Examination Guidelines: A complete evaluation includes B-mode imaging, spectral Doppler, color Doppler, and power Doppler as needed of all accessible portions of each vessel. Bilateral testing is considered an integral part of a complete examination. Limited examinations for reoccurring indications may be performed as noted. The reflux portion of the exam is performed with the patient in reverse Trendelenburg.  +---------+---------------+---------+-----------+----------+--------------+ RIGHT    CompressibilityPhasicitySpontaneityPropertiesThrombus Aging +---------+---------------+---------+-----------+----------+--------------+ CFV      Full           Yes      Yes                                 +---------+---------------+---------+-----------+----------+--------------+ SFJ      Full                                                        +---------+---------------+---------+-----------+----------+--------------+ FV Prox  Full                                                        +---------+---------------+---------+-----------+----------+--------------+ FV Mid   Full                                                        +---------+---------------+---------+-----------+----------+--------------+ FV DistalFull                                                        +---------+---------------+---------+-----------+----------+--------------+ PFV      Full                                                         +---------+---------------+---------+-----------+----------+--------------+ POP      Full           Yes      Yes                                 +---------+---------------+---------+-----------+----------+--------------+ PTV      Full                                                        +---------+---------------+---------+-----------+----------+--------------+  PERO     Full                                                        +---------+---------------+---------+-----------+----------+--------------+ GSV      Full                                                        +---------+---------------+---------+-----------+----------+--------------+   +---------+---------------+---------+-----------+----------+--------------+ LEFT     CompressibilityPhasicitySpontaneityPropertiesThrombus Aging +---------+---------------+---------+-----------+----------+--------------+ CFV      Full           Yes      Yes                                 +---------+---------------+---------+-----------+----------+--------------+ SFJ      Full                                                        +---------+---------------+---------+-----------+----------+--------------+ FV Prox  Full                                                        +---------+---------------+---------+-----------+----------+--------------+ FV Mid   Full                                                        +---------+---------------+---------+-----------+----------+--------------+ FV DistalFull                                                        +---------+---------------+---------+-----------+----------+--------------+ PFV      Full                                                        +---------+---------------+---------+-----------+----------+--------------+ POP      Full           Yes      Yes                                  +---------+---------------+---------+-----------+----------+--------------+ PTV      Full                                                        +---------+---------------+---------+-----------+----------+--------------+  PERO     Full                                                        +---------+---------------+---------+-----------+----------+--------------+ GSV      Full                                                        +---------+---------------+---------+-----------+----------+--------------+     Summary: BILATERAL: - No evidence of deep vein thrombosis seen in the lower extremities, bilaterally.  RIGHT: - No cystic structure found in the popliteal fossa.  LEFT: - No cystic structure found in the popliteal fossa.  *See table(s) above for measurements and observations. Electronically signed by Harold Barban MD on 04/19/2019 at 9:42:21 PM.    Final    Recent Labs    04/18/19 1939 04/19/19 0516  WBC 9.0 9.4  HGB 14.2 13.4  HCT 44.0 40.9  PLT 458* 450*   Recent Labs    04/18/19 0437 04/18/19 0437 04/18/19 1939 04/19/19 0516  NA 135  --   --  135  K 4.0  --   --  4.2  CL 92*  --   --  93*  CO2 31  --   --  30  GLUCOSE 100*  --   --  111*  BUN 16  --   --  17  CREATININE 0.77   < > 0.88 0.77  CALCIUM 8.9  --   --  8.8*   < > = values in this interval not displayed.    Intake/Output Summary (Last 24 hours) at 04/20/2019 1004 Last data filed at 04/20/2019 0900 Gross per 24 hour  Intake 510 ml  Output 675 ml  Net -165 ml     Physical Exam: Vital Signs Blood pressure 132/72, pulse 66, temperature 99.4 F (37.4 C), temperature source Oral, resp. rate 19, height 6' 1" (1.854 m), weight 74.3 kg, SpO2 91 %.    General: No acute distress Mood and affect are appropriate Heart: Regular rate and rhythm no rubs murmurs or extra sounds Lungs: Clear to auscultation, breathing unlabored, no rales or wheezes Abdomen: Positive bowel sounds, soft nontender to  palpation, nondistended Extremities: No clubbing, cyanosis, or edema Skin: No evidence of breakdown, no evidence of rash Neurologic: Cranial nerves II through XII intact, motor strength is 5/5 in right , 4- left  deltoid, bicep, tricep, grip, hip flexor, knee extensors, ankle dorsiflexor and plantar flexor  Musculoskeletal: Full range of motion in all 4 extremities. No joint swelling    Assessment/Plan: 1. Functional deficits secondary to Multiple left sided rib fractures  which require 3+ hours per day of interdisciplinary therapy in a comprehensive inpatient rehab setting.  Physiatrist is providing close team supervision and 24 hour management of active medical problems listed below.  Physiatrist and rehab team continue to assess barriers to discharge/monitor patient progress toward functional and medical goals  Care Tool:  Bathing  Bathing activity did not occur: Refused           Bathing assist       Upper Body Dressing/Undressing Upper body dressing   What is the patient wearing?: Pull  over shirt    Upper body assist Assist Level: Minimal Assistance - Patient > 75%    Lower Body Dressing/Undressing Lower body dressing      What is the patient wearing?: Pants, Underwear/pull up     Lower body assist Assist for lower body dressing: Moderate Assistance - Patient 50 - 74%     Toileting Toileting    Toileting assist Assist for toileting: Minimal Assistance - Patient > 75%     Transfers Chair/bed transfer  Transfers assist     Chair/bed transfer assist level: Contact Guard/Touching assist     Locomotion Ambulation   Ambulation assist      Assist level: Minimal Assistance - Patient > 75% Assistive device: No Device Max distance: 60'   Walk 10 feet activity   Assist     Assist level: Minimal Assistance - Patient > 75% Assistive device: No Device   Walk 50 feet activity   Assist Walk 50 feet with 2 turns activity did not occur:  Safety/medical concerns         Walk 150 feet activity   Assist Walk 150 feet activity did not occur: Safety/medical concerns         Walk 10 feet on uneven surface  activity   Assist Walk 10 feet on uneven surfaces activity did not occur: Safety/medical concerns         Wheelchair     Assist Will patient use wheelchair at discharge?: No   Wheelchair activity did not occur: N/A         Wheelchair 50 feet with 2 turns activity    Assist    Wheelchair 50 feet with 2 turns activity did not occur: N/A       Wheelchair 150 feet activity     Assist  Wheelchair 150 feet activity did not occur: N/A       Blood pressure 132/72, pulse 66, temperature 99.4 F (37.4 C), temperature source Oral, resp. rate 19, height 6' 1" (1.854 m), weight 74.3 kg, SpO2 91 %.      Medical Problem List and Plan: 1.Decreased functional mobilitysecondary to fall/concussion/multiple rib fractures with occult pneumothorax/possible subcortical impaction fracture of T8. Conservative care -patient may  shower -ELOS/Goals: 7-10d CIR PT, OT evals today , Team conference today please see physician documentation under team conference tab, met with team  to discuss problems,progress, and goals. Formulized individual treatment plan based on medical history, underlying problem and comorbidities. 2. Antithrombotics: -DVT/anticoagulation:Lovenox initiated 04/10/2019. Check vascular study -antiplatelet therapy: N/A 3. Pain Management:Tramadol 100 mg every 6 hours, Neurontin 100 mg 3 times daily, Robaxin 1000 mg 3 times daily, oxycodone as needed Has pain with deep breathing, may also desat due to this, may need some increased rest breaks with PT, OT - add Lido derm patch  4. Mood:Provide emotional support -antipsychotic agents: N/A 5. Neuropsych: This patientiscapable of making decisions on hisown behalf. 6. Skin/Wound  Care:Routine skin checks 7. Fluids/Electrolytes/Nutrition:Routine in and outs with follow-up chemistries 8. Tobacco abuse/pneumonia. NicoDerm patch. Provide counseling. Complete course of Omnicef. Checkcurrently day 3/10. Oxygen saturations every shift 9. Transient episode atrial fibrillation. Resolved. Cardiac rate controlled 10. Constipation. Laxative assistance.  LOS: 2 days A FACE TO [place] E Kirsteins 04/20/2019, 10:04 AM

## 2019-04-20 NOTE — Progress Notes (Signed)
Occupational Therapy Session Note  Patient Details  Name: Dale Perez MRN: [id number] Date of Birth: 12-22-1955  Today's Date: 04/20/2019 OT Individual Time: 0705-0800 OT Individual Time Calculation (min): 55 min    Short Term Goals: Week 1:  OT Short Term Goal 1 (Week 1): STG = LTGs due to ELOS  Skilled Therapeutic Interventions/Progress Updates:    Upon entering the room, pt supine in bed with 4/10 c/o pain in L ribs. Pt agreeable to shower this session. Pt ambulating with CGA progressing to supervision without use of AD. Pt declined toileting needs and needed supervision with min cuing for safety awareness with clothing management. Pt seated on shower chair and IV covered for safety. Pt bathing while seated with supervision overall. Pt transferred back to sit on EOB with CGA for dressing tasks. Pt taking seated rest break secondary increased coughing with mobility increasing pain in ribs as well. Pt donning B socks and shoes without assistance while seated on EOB. Pt returned to supine to rest until next session. Bed alarm activated and call bell within reach.   Therapy Documentation Precautions:  Precautions Precautions: Fall, Other (comment) Precaution Comments: watch sats Restrictions Weight Bearing Restrictions: No Pain: Pain Assessment Pain Scale: 0-10 Pain Score: 0-No pain ADL: ADL Grooming: Minimal assistance Where Assessed-Grooming: Standing at sink Lower Body Dressing: Maximal assistance Where Assessed-Lower Body Dressing: Chair Toileting: Minimal assistance Where Assessed-Toileting: Teacher, adult education: Curator Method: Proofreader: Raised toilet seat, Grab bars(BSC over toilet) ADL Comments: Pt declined bathing on eval due to pain   Therapy/Group: Individual Therapy  Alen Bleacher 04/20/2019, 10:07 AM

## 2019-04-20 NOTE — Patient Care Conference (Signed)
Inpatient RehabilitationTeam Conference and Plan of Care Update Date: 04/20/2019   Time: 10:05 AM    Patient Name: Dale Perez      Medical Record Number: [id number]  Date of Birth: 1955/03/17 Sex: Male         Room/Bed: 4W10C/4W10C-01 Payor Info: Payor: /    Admit Date/Time:  04/18/2019  6:33 PM  Primary Diagnosis:  Left rib fracture  Patient Active Problem List   Diagnosis Date Noted  . Debility 04/18/2019  . Left rib fracture 04/09/2019    Expected Discharge Date: Expected Discharge Date: 04/22/19  Team Members Present: Physician leading conference: Dr. Claudette Laws Care Coodinator Present: Cheyenne Adas, RN, BSN, CRRN;Genie Fuad Forget, RN, MSN Nurse Present: Doran Durand, LPN PT Present: Grier Rocher, PT OT Present: Jackquline Denmark, OT SLP Present: Suzzette Righter, CF-SLP PPS Coordinator present : Fae Pippin, SLP     Current Status/Progress Goal Weekly Team Focus  Bowel/Bladder   Continent B/B, uses urinal, LBM 3/16  Remain continent  PRN/QS toileting assessment   Swallow/Nutrition/ Hydration             ADL's   Min assist ambulatory transfers, Min assist toileting, Max assist LB dressing, refused bathing on eval  Supervision bathing/dressing, Mod I toileting and toilet transfers  ADL retraining, AE education for LB dressing, dynamic standing balance, endurance, pain management, pt/family education   Mobility   supervision assist-CGA for all mobility including bed mobility, transfers, gait and stairs  Mod I transfers and gait, supervision assist stairs  safety with functional mobility.and family education.   Communication             Safety/Cognition/ Behavioral Observations            Pain   Moderate pain on left side due to rib fractures, prn pain medications control pain level  Tolerable pain level  PRN/QS pain assessment   Skin   Yellow bruising on left thigh/hip and left rib cage, no evidence of skin breakdown  Remain free of any skin breakdown   PRN/QS skin assement    Rehab Goals Patient on target to meet rehab goals: Yes *See Care Plan and progress notes for long and short-term goals.     Barriers to Discharge  Current Status/Progress Possible Resolutions Date Resolved   Nursing  Inaccessible home environment;Decreased caregiver support;Lack of/limited family support;Behavior;Medical stability;Home environment access/layout;Incontinence;Medication compliance;Weight bearing restrictions               PT  Medical stability;New oxygen                 OT                  SLP                SW Decreased caregiver support;Home environment access/layout 1 level home with 5 step entry; wife working remotely temporarily            Discharge Planning/Teaching Needs:  Home with wife  TBD   Team Discussion: MD previous workers comp injury, fell out of tree, L rib fxs, limited use LUE, pain, on oxy, needs med before therapies, constipation.  RN cont B/B, had loose stools yesterday, prod cough, can DC IV per MD.  OT CGA/S, mod I goals toileting, S other goals, wife here yesterday, close to goal level.  PT bed S, amb 150' S.   Revisions to Treatment Plan: N/A     Medical Summary Current Status: posterolateral 4th-12th rib and  2nd to 6th ant rib fractures after a fall from ladder, pain control an issue Weekly Focus/Goal: pain control  Barriers to Discharge: Medical stability   Possible Resolutions to Barriers: adding lidoderm patch   Continued Need for Acute Rehabilitation Level of Care: The patient requires daily medical management by a physician with specialized training in physical medicine and rehabilitation for the following reasons: Direction of a multidisciplinary physical rehabilitation program to maximize functional independence : Yes Medical management of patient stability for increased activity during participation in an intensive rehabilitation regime.: Yes Analysis of laboratory values and/or radiology reports with any  subsequent need for medication adjustment and/or medical intervention. : Yes   I attest that I was present, lead the team conference, and concur with the assessment and plan of the team.   Retta Diones 04/20/2019, 10:21 PM   Team conference was held via web/ teleconference due to [place] - 19

## 2019-04-20 NOTE — Progress Notes (Signed)
Physical Therapy Session Note  Patient Details  Name: Dale Perez MRN: [id number] Date of Birth: 11/21/1955  Today's Date: 04/20/2019 PT Individual Time: 0830-0925 and 1135-1200 PT Individual Time Calculation (min): 55 min 25min  Short Term Goals: Week 1:  PT Short Term Goal 1 (Week 1): =LTGs due to ELOS  Skilled Therapeutic Interventions/Progress Updates:   Pt received supine in bed and agreeable to PT. PT obtained WC. Supine>sit transfer with distant supervision assist and use of bed features. Stand pivot transfer to WC with supervision assist from low bed height.   Pt transported to day in WC . Gait training with supervision assist and pillow pressed against R side of ribs x 150ft. No LOB noted. Pt reports increased pain with increased distance due to sputum production and cough. Prolonged therapeutic rest break following gait for pain management and with us ef pursed lip breathing.  Dynamic balance with wii fit penguin slide x2 with supervision assist. Pt with adequate use of ankle strategy to shift and control COM. Increasing pain with L lateral weight shift in ribs    Patient returned to room and left sitting in WC with call bell in reach and all needs met.     Session 2.   Pt received sitting in WC and agreeable to PT. Pt transported to rehab gym in WC. Dynamic gait training with no AD, stepping over 1 inch obstacles 4 x 4 with distant supervision assist. Weave through 6 cones x 3 with distant supervision assist. Min cues for safety with transfers due to poor weight shifting 2/2 pain.  Patient returned to room and left sitting in WC with call bell in reach and all needs met.          Therapy Documentation Precautions:  Precautions Precautions: Fall, Other (comment) Precaution Comments: watch sats Restrictions Weight Bearing Restrictions: No Pain: Pain Assessment Pain Scale: 0-10 Pain Score: 0-No pain at rest.  8/10 with Gait training. RN made aware.      Therapy/Group: Individual Therapy  Austin E Tucker 04/20/2019, 10:20 AM  

## 2019-04-20 NOTE — Progress Notes (Signed)
Occupational Therapy Session Note  Patient Details  Name: Dale Perez MRN: [id number] Date of Birth: 08/02/1955  Today's Date: 04/20/2019 OT Individual Time: 1400-1435 OT Individual Time Calculation (min): 35 min    Short Term Goals: Week 1:  OT Short Term Goal 1 (Week 1): STG = LTGs due to ELOS  Skilled Therapeutic Interventions/Progress Updates:    1:1 Pt received in the recliner. Pt able to ambulate from the room to the ADL apartment without a seated rest break with supervision. Pt able to transfer in and out recliner with supervision with extra time. Discussed DME options for showering at home. Pt reports they have some bath equipment already.  He was able to demonstrate stepping over the tub threshold with supervision with extra time and stepping over first with right side (as well as coming out with the right side first. Pt reports the equipment they have will work.  Returned to the room and presented with increased pain with need to cough (productivly). Once able to spit out phlegm some of pain able to reside.   Declined to try to don shoes at this time to trial his performance with this task.  Therapy Documentation Precautions:  Precautions Precautions: Fall, Other (comment) Precaution Comments: watch sats Restrictions Weight Bearing Restrictions: No General: General OT Amount of Missed Time: 10 Minutes Vital Signs: Therapy Vitals Temp: 97.8 F (36.6 C) Pulse Rate: 65 Resp: 17 BP: 139/76 Patient Position (if appropriate): Sitting Oxygen Therapy SpO2: 90 % O2 Device: Room Air Pain:    Therapy/Group: Individual Therapy  Roney Mans [facility] 04/20/2019, 9:20 PM

## 2019-04-20 NOTE — Discharge Summary (Signed)
Physician Discharge Summary  Patient ID: Dale Perez MRN: [id number] DOB/AGE: [birth year] 64 y.o.  Admit date: 04/18/2019 Discharge date: 04/22/2019  Discharge Diagnoses:  Principal Problem:   Left rib fracture Active Problems:   Debility DVT prophylaxis Tobacco abuse Transient episode atrial fibrillation Pain management Constipation  Discharged Condition: Stable  Significant Diagnostic Studies: CT HEAD WO CONTRAST  Result Date: 04/09/2019 CLINICAL DATA:  Head trauma, headache, fell 17 feet from a tree. No obvious deformities. Abrasion to left posterior thigh, positive loss of consciousness, GCS 15 EXAM: CT HEAD WITHOUT CONTRAST CT CERVICAL SPINE WITHOUT CONTRAST TECHNIQUE: Multidetector CT imaging of the head and cervical spine was performed following the standard protocol without intravenous contrast. Multiplanar CT image reconstructions of the cervical spine were also generated. COMPARISON:  CT 08/07/2017 FINDINGS: CT HEAD FINDINGS Brain: No evidence of acute infarction, hemorrhage, hydrocephalus, extra-axial collection or mass lesion/mass effect. Symmetric prominence of the ventricles, cisterns and sulci compatible with parenchymal volume loss. Patchy areas of white matter hypoattenuation are most compatible with chronic microvascular angiopathy. Vascular: Atherosclerotic calcification of the carotid siphons. No hyperdense vessel. Skull: No calvarial fracture or scalp swelling. Mild soft tissue thickening superficial to the left zygoma. Most compatible with a small dermal inclusion cyst. Few benign scalp calcifications are present. Sinuses/Orbits: Minimal mucosal thickening in the ethmoid sinuses. Remaining paranasal sinuses are predominantly clear. Hypo pneumatization of the mastoids with a left mastoid effusion and hyperostotic features similar to prior and suggesting chronicity. Soft tissue attenuation attic of the left middle ear cavity with destruction of the left ossicular chain  most suspicious for a left cholesteatoma. Other: Small amount of debris in the external auditory canal on the right. CT CERVICAL SPINE FINDINGS Alignment: Cervical stabilization collar is in place at the time of examination. Preservation of the normal cervical lordosis without traumatic listhesis. No abnormally widened, perched or jumped facets. Normal alignment of the craniocervical and atlantoaxial articulations. Skull base and vertebrae: No acute fracture. No primary bone lesion or focal pathologic process. Soft tissues and spinal canal: No pre or paravertebral fluid or swelling. No visible canal hematoma. Disc levels: Multilevel intervertebral disc height loss with spondylitic endplate changes. Features are most pronounced C4-C7 where disc osteophyte complexes efface the ventral thecal sac without significant canal stenosis. Mild uncinate spurring and facet arthropathic changes at these levels do result in mild bilateral neural foramina narrowing. Upper chest: No acute abnormality in the upper chest or imaged lung apices. Other: Normal thyroid. Question postsurgical changes along the posterior left thyroid lobe. Cervical carotid atherosclerosis is noted. IMPRESSION: 1. No CT evidence of acute intracranial process. No acute scalp swelling or calvarial fracture. 2. Mild parenchymal volume loss and chronic microvascular angiopathy. 3. Chronic left-sided mastoiditis. 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 4. No acute fracture or traumatic listhesis of the cervical spine. 5. Mild multilevel intervertebral spondylitic changes, most pronounced C4-C7. Detailed above. Electronically Signed   By: Kreg Shropshire M.D.   On: 04/09/2019 17:26   CT Chest W Contrast  Result Date: 04/09/2019 CLINICAL DATA:  Lower back pain, trauma, fall approximately 17 foot from tree PE stating is EXAM: CT CHEST,  ABDOMEN, AND PELVIS WITH CONTRAST TECHNIQUE: Multidetector CT imaging of the chest, abdomen and pelvis was performed following the standard protocol during bolus administration of intravenous contrast. CONTRAST:  OMNIPAQUE IOHEXOL 300 MG/ML  SOLN COMPARISON:  CT abdomen pelvis 07/24/2015 FINDINGS:  CT CHEST FINDINGS Cardiovascular: The aorta is normal caliber. No intramural hematoma, dissection flap or other acute luminal abnormality of the aorta is seen. No periaortic stranding or hemorrhage. Atheromatous plaque seen throughout the thoracic aorta and within the normally branching proximal great vessels including some mild proximal atheromatous narrowing of the left common carotid and subclavian arteries and right vertebral artery. Normal heart size. No pericardial effusion. Coronary artery calcifications are present central pulmonary arteries are normal caliber. No large central filling defects on this non tailored examination of the pulmonary arteries. Mediastinum/Nodes: No mediastinal fluid or gas. Normal thyroid gland and thoracic inlet. No acute abnormality of the trachea or esophagus. No worrisome mediastinal, hilar or axillary adenopathy. Lungs/Pleura: Small left apical and medial pneumothorax and trace pleural thickening, likely hemothorax. Dependent ground-glass towards the lung bases most likely reflects atelectasis though some underlying pulmonary contusion could have a similar appearance. No other acute traumatic findings of the lung parenchyma. Musculoskeletal: Posterior left fourth through twelfth minimally displaced rib fractures. Additional anterior left second through sixth rib fractures. Findings result in segmental fractures of 4th to 6th ribs. No visible right rib fractures. Scapula appear intact. No acute traumatic abnormality of the imaged shoulder girdles. No sternal fracture is seen. Complete fusion across the sternomanubrial joint is noted incidentally. Subtle sclerotic band subjacent  to the superior endplate cortex at T8 with some mild anterior wedging may reflect a subcortical impaction fracture. No adjacent paraspinal hemorrhage or visible canal hematoma. CT ABDOMEN PELVIS FINDINGS Hepatobiliary: No direct hepatic injury or perihepatic hematoma. Small subcentimeter hypoattenuating foci in the anterior left lobe and posterior right lobe liver too small to fully characterize on CT imaging but statistically likely benign. No focal liver abnormality is seen. No gallstones, gallbladder wall thickening, or biliary dilatation. Mild low-attenuation nodule at the tip of the gallbladder fundus, likely adenomyomatosis. Pancreas: Normal uniform enhancement of the pancreas. No evidence of pancreatic contusion or ductal disruption. No peripancreatic inflammation. Spleen: No direct splenic injury or perisplenic hematoma. Adrenals/Urinary Tract: No adrenal hemorrhage or suspicious adrenal lesions. 4.5 cm cystic appearing lesion in the upper pole right kidney with thin inferior septation, Bosniak II. Few punctate nonobstructing calculi in both kidneys as well as several vascular calcifications. No obstructive urolithiasis or hydronephrosis. Kidneys enhance and excrete symmetrically without extravasation of contrast on excretory phase imaging. No evidence of direct bladder injury. Stomach/Bowel: Distal esophagus, stomach and duodenum are unremarkable. No small bowel dilatation or wall thickening. No colonic dilatation or wall thickening. The appendix is surgically absent. Scattered colonic diverticula without focal pericolonic inflammation to suggest diverticulitis. No mesenteric hematoma or mesenteric contusion is evident. Vascular/Lymphatic: Atherosclerotic plaque within the normal caliber aorta. Fusiform aneurysmal dilatation of the infrarenal abdominal aorta to 4.4 cm in size with eccentric calcified and noncalcified mural plaque. Additional plaque noted in the proximal great vessels including mild  narrowing in the proximal splenic artery and at the IMA origin. Atheromatous disease is seen extending into the proximal inflow and outflow levels. Moderate narrowing at the distal right common iliac at the level of the bifurcation. No periaortic stranding or hemorrhage. No evidence of acute vascular injury or dissection. Retroaortic left renal vein is noted. No suspicious or enlarged lymph nodes in the included lymphatic chains. Reproductive: Mild prostatomegaly. Coarse eccentric benign calcifications. Other: No abdominopelvic free fluid or air. No large abdominal wall dehiscence. Contusive changes noted across the left flank and chest wall. No bowel containing hernias. Musculoskeletal: Multilevel degenerative changes noted in the lower lumbar spine. Acute fracture  or traumatic listhesis. Degenerative retrolisthesis L5 on S1 is noted with maximal discogenic changes at this level. Bony pelvis is intact. Proximal femora are intact and well seated within the acetabula. IMPRESSION: Traumatic 1. Posterior left fourth through twelfth minimally displaced rib fractures. Additional anterior left second through sixth rib fractures. Given the extent of fractures including contiguous segmental fractures, close clinical assessment for flail chest morphology is recommended. 2. Small left apical and medial pneumothorax and trace pleural thickening, likely hemothorax. Some dependent atelectasis versus pulmonary contusion noted in bases. 3. Subtle sclerotic band subjacent to the superior endplate cortex at T8 with some mild anterior wedging may reflect a subcortical impaction fracture. No adjacent paraspinal hemorrhage or visible canal hematoma. Nontraumatic 1. 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 2. Coronary artery atherosclerosis. 3. Colonic diverticulosis without evidence of acute diverticulitis. 4. 4.4 cm infrarenal abdominal aortic aneurysm. Reflects interval enlargement since 2017 comparison.  Recommend followup by ultrasound in 1 year. This recommendation follows ACR consensus guidelines: White Paper of the ACR Incidental Findings Committee II on Vascular Findings. J Am Coll Radiol 2013; 10:789-794. Aortic aneurysm NOS (ICD10-I71.9). 5. Multilevel degenerative changes in the lower lumbar spine with associated grade 1 retrolisthesis L5 on S1. These results were called by telephone at the time of interpretation on 04/09/2019 at 5:46 pm to provider [facility] , who verbally acknowledged these results. Electronically Signed   By: Kreg Shropshire M.D.   On: 04/09/2019 17:48   CT CERVICAL SPINE WO CONTRAST  Result Date: 04/09/2019 CLINICAL DATA:  Head trauma, headache, fell 17 feet from a tree. No obvious deformities. Abrasion to left posterior thigh, positive loss of consciousness, GCS 15 EXAM: CT HEAD WITHOUT CONTRAST CT CERVICAL SPINE WITHOUT CONTRAST TECHNIQUE: Multidetector CT imaging of the head and cervical spine was performed following the standard protocol without intravenous contrast. Multiplanar CT image reconstructions of the cervical spine were also generated. COMPARISON:  CT 08/07/2017 FINDINGS: CT HEAD FINDINGS Brain: No evidence of acute infarction, hemorrhage, hydrocephalus, extra-axial collection or mass lesion/mass effect. Symmetric prominence of the ventricles, cisterns and sulci compatible with parenchymal volume loss. Patchy areas of white matter hypoattenuation are most compatible with chronic microvascular angiopathy. Vascular: Atherosclerotic calcification of the carotid siphons. No hyperdense vessel. Skull: No calvarial fracture or scalp swelling. Mild soft tissue thickening superficial to the left zygoma. Most compatible with a small dermal inclusion cyst. Few benign scalp calcifications are present. Sinuses/Orbits: Minimal mucosal thickening in the ethmoid sinuses. Remaining paranasal sinuses are predominantly clear. Hypo pneumatization of the mastoids with a left mastoid effusion  and hyperostotic features similar to prior and suggesting chronicity. Soft tissue attenuation attic of the left middle ear cavity with destruction of the left ossicular chain most suspicious for a left cholesteatoma. Other: Small amount of debris in the external auditory canal on the right. CT CERVICAL SPINE FINDINGS Alignment: Cervical stabilization collar is in place at the time of examination. Preservation of the normal cervical lordosis without traumatic listhesis. No abnormally widened, perched or jumped facets. Normal alignment of the craniocervical and atlantoaxial articulations. Skull base and vertebrae: No acute fracture. No primary bone lesion or focal pathologic process. Soft tissues and spinal canal: No pre or paravertebral fluid or swelling. No visible canal hematoma. Disc levels: Multilevel intervertebral disc height loss with spondylitic endplate changes. Features are most pronounced C4-C7 where disc osteophyte complexes efface the ventral thecal sac without significant canal stenosis. Mild uncinate spurring and facet arthropathic changes at these levels  do result in mild bilateral neural foramina narrowing. Upper chest: No acute abnormality in the upper chest or imaged lung apices. Other: Normal thyroid. Question postsurgical changes along the posterior left thyroid lobe. Cervical carotid atherosclerosis is noted. IMPRESSION: 1. No CT evidence of acute intracranial process. No acute scalp swelling or calvarial fracture. 2. Mild parenchymal volume loss and chronic microvascular angiopathy. 3. Chronic left-sided mastoiditis. 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 4. No acute fracture or traumatic listhesis of the cervical spine. 5. Mild multilevel intervertebral spondylitic changes, most pronounced C4-C7. Detailed above. Electronically Signed   By: Kreg Shropshire M.D.   On:  04/09/2019 17:26   CT ABDOMEN PELVIS W CONTRAST  Result Date: 04/09/2019 CLINICAL DATA:  Lower back pain, trauma, fall approximately 17 foot from tree PE stating is EXAM: CT CHEST, ABDOMEN, AND PELVIS WITH CONTRAST TECHNIQUE: Multidetector CT imaging of the chest, abdomen and pelvis was performed following the standard protocol during bolus administration of intravenous contrast. CONTRAST:  OMNIPAQUE IOHEXOL 300 MG/ML  SOLN COMPARISON:  CT abdomen pelvis 07/24/2015 FINDINGS: CT CHEST FINDINGS Cardiovascular: The aorta is normal caliber. No intramural hematoma, dissection flap or other acute luminal abnormality of the aorta is seen. No periaortic stranding or hemorrhage. Atheromatous plaque seen throughout the thoracic aorta and within the normally branching proximal great vessels including some mild proximal atheromatous narrowing of the left common carotid and subclavian arteries and right vertebral artery. Normal heart size. No pericardial effusion. Coronary artery calcifications are present central pulmonary arteries are normal caliber. No large central filling defects on this non tailored examination of the pulmonary arteries. Mediastinum/Nodes: No mediastinal fluid or gas. Normal thyroid gland and thoracic inlet. No acute abnormality of the trachea or esophagus. No worrisome mediastinal, hilar or axillary adenopathy. Lungs/Pleura: Small left apical and medial pneumothorax and trace pleural thickening, likely hemothorax. Dependent ground-glass towards the lung bases most likely reflects atelectasis though some underlying pulmonary contusion could have a similar appearance. No other acute traumatic findings of the lung parenchyma. Musculoskeletal: Posterior left fourth through twelfth minimally displaced rib fractures. Additional anterior left second through sixth rib fractures. Findings result in segmental fractures of 4th to 6th ribs. No visible right rib fractures. Scapula appear intact. No acute  traumatic abnormality of the imaged shoulder girdles. No sternal fracture is seen. Complete fusion across the sternomanubrial joint is noted incidentally. Subtle sclerotic band subjacent to the superior endplate cortex at T8 with some mild anterior wedging may reflect a subcortical impaction fracture. No adjacent paraspinal hemorrhage or visible canal hematoma. CT ABDOMEN PELVIS FINDINGS Hepatobiliary: No direct hepatic injury or perihepatic hematoma. Small subcentimeter hypoattenuating foci in the anterior left lobe and posterior right lobe liver too small to fully characterize on CT imaging but statistically likely benign. No focal liver abnormality is seen. No gallstones, gallbladder wall thickening, or biliary dilatation. Mild low-attenuation nodule at the tip of the gallbladder fundus, likely adenomyomatosis. Pancreas: Normal uniform enhancement of the pancreas. No evidence of pancreatic contusion or ductal disruption. No peripancreatic inflammation. Spleen: No direct splenic injury or perisplenic hematoma. Adrenals/Urinary Tract: No adrenal hemorrhage or suspicious adrenal lesions. 4.5 cm cystic appearing lesion in the upper pole right kidney with thin inferior septation, Bosniak II. Few punctate nonobstructing calculi in both kidneys as well as several vascular calcifications. No obstructive urolithiasis or hydronephrosis. Kidneys enhance and excrete symmetrically without extravasation of contrast on excretory phase  imaging. No evidence of direct bladder injury. Stomach/Bowel: Distal esophagus, stomach and duodenum are unremarkable. No small bowel dilatation or wall thickening. No colonic dilatation or wall thickening. The appendix is surgically absent. Scattered colonic diverticula without focal pericolonic inflammation to suggest diverticulitis. No mesenteric hematoma or mesenteric contusion is evident. Vascular/Lymphatic: Atherosclerotic plaque within the normal caliber aorta. Fusiform aneurysmal  dilatation of the infrarenal abdominal aorta to 4.4 cm in size with eccentric calcified and noncalcified mural plaque. Additional plaque noted in the proximal great vessels including mild narrowing in the proximal splenic artery and at the IMA origin. Atheromatous disease is seen extending into the proximal inflow and outflow levels. Moderate narrowing at the distal right common iliac at the level of the bifurcation. No periaortic stranding or hemorrhage. No evidence of acute vascular injury or dissection. Retroaortic left renal vein is noted. No suspicious or enlarged lymph nodes in the included lymphatic chains. Reproductive: Mild prostatomegaly. Coarse eccentric benign calcifications. Other: No abdominopelvic free fluid or air. No large abdominal wall dehiscence. Contusive changes noted across the left flank and chest wall. No bowel containing hernias. Musculoskeletal: Multilevel degenerative changes noted in the lower lumbar spine. Acute fracture or traumatic listhesis. Degenerative retrolisthesis L5 on S1 is noted with maximal discogenic changes at this level. Bony pelvis is intact. Proximal femora are intact and well seated within the acetabula. IMPRESSION: Traumatic 1. Posterior left fourth through twelfth minimally displaced rib fractures. Additional anterior left second through sixth rib fractures. Given the extent of fractures including contiguous segmental fractures, close clinical assessment for flail chest morphology is recommended. 2. Small left apical and medial pneumothorax and trace pleural thickening, likely hemothorax. Some dependent atelectasis versus pulmonary contusion noted in bases. 3. Subtle sclerotic band subjacent to the superior endplate cortex at T8 with some mild anterior wedging may reflect a subcortical impaction fracture. No adjacent paraspinal hemorrhage or visible canal hematoma. Nontraumatic 1. 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 2. Coronary artery  atherosclerosis. 3. Colonic diverticulosis without evidence of acute diverticulitis. 4. 4.4 cm infrarenal abdominal aortic aneurysm. Reflects interval enlargement since 2017 comparison. Recommend followup by ultrasound in 1 year. This recommendation follows ACR consensus guidelines: White Paper of the ACR Incidental Findings Committee II on Vascular Findings. J Am Coll Radiol 2013; 10:789-794. Aortic aneurysm NOS (ICD10-I71.9). 5. Multilevel degenerative changes in the lower lumbar spine with associated grade 1 retrolisthesis L5 on S1. These results were called by telephone at the time of interpretation on 04/09/2019 at 5:46 pm to provider [place] , who verbally acknowledged these results. Electronically Signed   By: Kreg Shropshire M.D.   On: 04/09/2019 17:48   DG Pelvis Portable  Result Date: 04/09/2019 CLINICAL DATA:  64 year old who fell approximately 17 feet out of a tree. Initial encounter. EXAM: PORTABLE PELVIS 1-2 VIEWS COMPARISON:  None. FINDINGS: No evidence of acute fracture. Hip joints anatomically aligned with well-preserved joint spaces. Sacroiliac joints and symphysis pubis anatomically aligned without diastasis and without degenerative change. Note is made of a distal abdominal aortic aneurysm measuring approximately 5 cm (uncorrected for magnification). IMPRESSION: 1. No acute osseous abnormality. 2. Distal abdominal aortic aneurysm measuring approximately 5 cm (uncorrected for magnification). This will be better evaluated on the CT abdomen and pelvis obtained subsequently. Electronically Signed   By: Hulan Saas M.D.   On: 04/09/2019 17:02   DG CHEST PORT 1 VIEW  Result Date: 04/11/2019 CLINICAL DATA:  Short of breath.  Left rib fracture from a  fall. EXAM: PORTABLE CHEST 1 VIEW COMPARISON:  04/10/2019 FINDINGS: Stable appearance of the left anterior 6 rib fracture. Other fractures seen on the prior CT are not well visualized. There is mild lung base opacity consistent with atelectasis.  Remainder of the lungs is clear. No convincing pleural effusion and no pneumothorax. Cardiac silhouette is normal in size. No mediastinal or hilar masses. IMPRESSION: 1. No significant change from the previous day's exam. Mild lung base opacity consistent with atelectasis. 2. No acute cardiopulmonary disease.  No pneumothorax. Electronically Signed   By: Amie Portland M.D.   On: 04/11/2019 06:53   DG Chest Port 1 View  Result Date: 04/10/2019 CLINICAL DATA:  64 year old who fell approximately 17 feet out of a tree yesterday. Followup small LEFT pneumothorax related to multiple minimally displaced LEFT rib fractures. EXAM: PORTABLE CHEST 1 VIEW COMPARISON:  Chest x-rays 04/09/2019 and earlier. CT chest 04/09/2019. FINDINGS: Cardiac silhouette upper normal in size, unchanged. The trace apicomedial LEFT pneumothorax identified on CT is inconspicuous on the chest x-ray. Mild bibasilar atelectasis, slightly increased since yesterday. Lungs remain clear otherwise. Normal pulmonary vascularity. The LEFT rib fractures identified on CT are also inconspicuous apart from the fracture involving the anterolateral 6th rib fracture. IMPRESSION: 1. The trace apicomedial LEFT pneumothorax identified on CT is inconspicuous on the chest x-ray. 2. Mild bibasilar atelectasis. No acute cardiopulmonary disease otherwise. Electronically Signed   By: Hulan Saas M.D.   On: 04/10/2019 08:23   DG Chest Port 1 View  Result Date: 04/09/2019 CLINICAL DATA:  Level 2 trauma. Pt fell 17 ft from a ladder leaning on a tree. EXAM: PORTABLE CHEST 1 VIEW COMPARISON:  Chest radiograph 12/14/2007 FINDINGS: Cardiomediastinal contours within normal limits. The lungs are clear. No pneumothorax or significant pleural effusion. Left lateral rib fracture. IMPRESSION: No acute cardiopulmonary abnormality. Left lateral rib fracture. Electronically Signed   By: Emmaline Kluver M.D.   On: 04/09/2019 17:00   VAS [place] LOWER EXTREMITY VENOUS  (DVT)  Result Date: 04/19/2019  Lower Venous DVTStudy Indications: Swelling.  Performing Technologist: Levada Schilling RDMS, RVT  Examination Guidelines: A complete evaluation includes B-mode imaging, spectral Doppler, color Doppler, and power Doppler as needed of all accessible portions of each vessel. Bilateral testing is considered an integral part of a complete examination. Limited examinations for reoccurring indications may be performed as noted. The reflux portion of the exam is performed with the patient in reverse Trendelenburg.  +---------+---------------+---------+-----------+----------+--------------+ RIGHT    CompressibilityPhasicitySpontaneityPropertiesThrombus Aging +---------+---------------+---------+-----------+----------+--------------+ CFV      Full           Yes      Yes                                 +---------+---------------+---------+-----------+----------+--------------+ SFJ      Full                                                        +---------+---------------+---------+-----------+----------+--------------+ FV Prox  Full                                                        +---------+---------------+---------+-----------+----------+--------------+  FV Mid   Full                                                        +---------+---------------+---------+-----------+----------+--------------+ FV DistalFull                                                        +---------+---------------+---------+-----------+----------+--------------+ PFV      Full                                                        +---------+---------------+---------+-----------+----------+--------------+ POP      Full           Yes      Yes                                 +---------+---------------+---------+-----------+----------+--------------+ PTV      Full                                                         +---------+---------------+---------+-----------+----------+--------------+ PERO     Full                                                        +---------+---------------+---------+-----------+----------+--------------+ GSV      Full                                                        +---------+---------------+---------+-----------+----------+--------------+   +---------+---------------+---------+-----------+----------+--------------+ LEFT     CompressibilityPhasicitySpontaneityPropertiesThrombus Aging +---------+---------------+---------+-----------+----------+--------------+ CFV      Full           Yes      Yes                                 +---------+---------------+---------+-----------+----------+--------------+ SFJ      Full                                                        +---------+---------------+---------+-----------+----------+--------------+ FV Prox  Full                                                        +---------+---------------+---------+-----------+----------+--------------+  FV Mid   Full                                                        +---------+---------------+---------+-----------+----------+--------------+ FV DistalFull                                                        +---------+---------------+---------+-----------+----------+--------------+ PFV      Full                                                        +---------+---------------+---------+-----------+----------+--------------+ POP      Full           Yes      Yes                                 +---------+---------------+---------+-----------+----------+--------------+ PTV      Full                                                        +---------+---------------+---------+-----------+----------+--------------+ PERO     Full                                                         +---------+---------------+---------+-----------+----------+--------------+ GSV      Full                                                        +---------+---------------+---------+-----------+----------+--------------+     Summary: BILATERAL: - No evidence of deep vein thrombosis seen in the lower extremities, bilaterally.  RIGHT: - No cystic structure found in the popliteal fossa.  LEFT: - No cystic structure found in the popliteal fossa.  *See table(s) above for measurements and observations. Electronically signed by Coral ElseVance Brabham MD on 04/19/2019 at 9:42:21 PM.    Final     Labs:  Basic Metabolic Panel: Recent Labs  Lab 04/16/19 0627 04/18/19 0437 04/18/19 1939 04/19/19 0516  NA 133* 135  --  135  K 3.9 4.0  --  4.2  CL 92* 92*  --  93*  CO2 29 31  --  30  GLUCOSE 98 100*  --  111*  BUN 21 16  --  17  CREATININE 0.68 0.77 0.88 0.77  CALCIUM 8.9 8.9  --  8.8*    CBC: Recent Labs  Lab 04/16/19 0627 04/18/19 1939 04/19/19 0516  WBC 8.1 9.0 9.4  NEUTROABS  --   --  5.4  HGB 13.9 14.2 13.4  HCT 42.3 44.0 40.9  MCV 86.2 88.9 87.8  PLT 332 458* 450*    CBG: No results for input(s): GLUCAP in the last 168 hours.  Family history.  Mother and father with hypertension as well as hyperlipidemia.  Denies any diabetes mellitus colon cancer or rectal cancer  Brief HPI:   Dale Perez is a 64 y.o. right-handed male with history of tobacco abuse on no prescription medications who lives with spouse independent prior to admission.  By report he had been on [employer]. due to a elevator accident required knee and shoulder surgery was scheduled to go back to work 04/26/2019.  Presented 04/09/2019 after a fall from a ladder approximately 20 feet while cutting down a tree limb.  There was reported brief loss of consciousness.  Admission chemistries unremarkable.  Cranial CT scan CT cervical spine no acute changes.  CT of the chest and abdomen pelvis showed posterior left fourth  through 12th minimally displaced rib fractures.  Additional anterior left second through sixth rib fractures small left apical and medial pneumothorax and trace pleural thickening likely hemothorax.  Subtle sclerotic band subadjacent to the superior endplate cortex at T8 with some mild anterior wedging possibly reflecting subcortical impaction fracture.  Incidental findings of 4.5 cm cyst within the upper pole of the right kidney as well as 4.4 cm AAA.  Hospital course pain management.  Noted episode of atrial fibrillation 04/13/2019 resolved after receiving Lopressor which was later discontinued.  Strep pneumonia completed course of Omnicef.  He remained afebrile.  In regards to incidental findings of 4.4 cm infrarenal abdominal aortic aneurysm follow-up Dr. Chestine Spore vascular surgery and was to see as outpatient.  Subcutaneous Lovenox for DVT prophylaxis.  Patient was admitted for a comprehensive rehab program   Hospital Course: Dale Perez was admitted to rehab 04/18/2019 for inpatient therapies to consist of PT, ST and OT at least three hours five days a week. Past admission physiatrist, therapy team and rehab RN have worked together to provide customized collaborative inpatient rehab.  Pertaining to patient's decreased functional ability after a fall with multiple rib fractures occult pneumothorax conservative care he was attending full therapies.  Subcutaneous Lovenox for DVT prophylaxis initiated 04/10/2019 and vascular studies negative.  Pain management with the use of tramadol and Neurontin he was using oxycodone for breakthrough pain.  He did have a history of tobacco abuse received counts regards to cessation of nicotine products he completed a 10-day course of Omnicef for pneumonia he remained afebrile oxygen saturations greater than 90% on room air.  Hospital course transient episode of atrial fibrillation no further episodes reported no chest pain or shortness of breath cardiac rate controlled.  Bouts  of constipation resolved with laxative assistance.   Blood pressures were monitored on TID basis and controlled  He/ is continent of bowel and bladder.  He/ has made gains during rehab stay and is attending therapies  He/ will continue to receive follow up therapies   after discharge  Rehab course: During patient's stay in rehab weekly team conferences were held to monitor patient's progress, set goals and discuss barriers to discharge. At admission, patient required minimal guard 50 feet rolling walker, minimal assist stand pivot transfers, minimal assist sit to supine.  Moderate assist upper body bathing max is lower body bathing moderate assist upper body dressing total assist lower body dressing  Physical exam.  Blood pressure 135/88 pulse 84 temperature 97 respirations 18 oxygen saturation  92% room air Constitutional.  Well-developed well-nourished HEENT Head.  Normocephalic and atraumatic Eyes.  Pupils round and reactive to light no discharge.nystagmus Neck.  Supple nontender no JVD without thyromegaly Cardiac regular rate rhythm without any extra sounds or murmur heard Abdomen.  Soft nontender positive bowel sounds Extremities.  No clubbing cyanosis or edema Neurological.  Motor strength 5 out of 5 in the right 4 - left deltoid bicep tricep grip 5 out of 5 bilateral hip flexors knee extensors ankle dorsi plantarflexion.  Sensation intact  He/  has had improvement in activity tolerance, balance, postural control as well as ability to compensate for deficits. He/ has had improvement in functional use RUE/LUE  and RLE/LLE as well as improvement in awareness.  Patient requires supervision for mobility navigate stairs with contact-guard assist got his belongings for activities day living and homemaking.  He can ambulate up to 125 feet and oxygen saturations greater than 93% room air.  Full family teaching completed plan discharge to home       Disposition: Discharge to  home    Diet: Regular  Special Instructions: No driving smoking or alcohol  Medications at discharge 1.  Tylenol as needed 2.  Colace 100 mg p.o. twice daily 3.  Neurontin 100 mg p.o. 3 times daily 4.  Lidoderm patch 2 patches change as directed 5.  Robaxin 1000 mg p.o. 3 times daily 6.  NicoDerm patch taper as directed 7.  Oxycodone 10 mg every 4 hours as needed pain 8.  MiraLAX daily hold for loose stools  Discharge Instructions    Ambulatory referral to Physical Therapy   Complete by: As directed    eval and treat      Follow-up Information    Kirsteins, Luanna Salk, MD Follow up.   Specialty: Physical Medicine and Rehabilitation Why: No follow-up needed Contact information: [place] [address] [phone]        Marty Heck, MD Follow up.   Specialty: Vascular Surgery Why: Follow-up regarding infrarenal abdominal aortic aneurysm Contact information: [place] [address] [phone]           Signed: Lavon Paganini [place] 04/22/2019, 5:19 AM

## 2019-04-21 ENCOUNTER — Inpatient Hospital Stay (HOSPITAL_COMMUNITY): Payer: Self-pay | Admitting: Physical Therapy

## 2019-04-21 ENCOUNTER — Inpatient Hospital Stay (HOSPITAL_COMMUNITY): Payer: Self-pay | Admitting: Occupational Therapy

## 2019-04-21 MED ORDER — ACETAMINOPHEN 325 MG PO TABS: 325.0000 mg | ORAL_TABLET | ORAL | Status: DC | PRN

## 2019-04-21 MED ORDER — DOCUSATE SODIUM 100 MG PO CAPS: 100.0000 mg | ORAL_CAPSULE | Freq: Two times a day (BID) | ORAL | 0 refills | Status: DC

## 2019-04-21 MED ORDER — LIDOCAINE 5 % EX PTCH: 2.0000 | MEDICATED_PATCH | Freq: Every day | CUTANEOUS | 0 refills | Status: DC

## 2019-04-21 MED ORDER — METHOCARBAMOL 500 MG PO TABS: 1000.0000 mg | ORAL_TABLET | Freq: Three times a day (TID) | ORAL | 0 refills | Status: DC

## 2019-04-21 MED ORDER — OXYCODONE HCL 10 MG PO TABS: 10.0000 mg | ORAL_TABLET | ORAL | 0 refills | Status: DC | PRN

## 2019-04-21 MED ORDER — POLYETHYLENE GLYCOL 3350 17 G PO PACK: 17.0000 g | PACK | Freq: Every day | ORAL | 0 refills | Status: DC

## 2019-04-21 MED ORDER — GABAPENTIN 100 MG PO CAPS: 100.0000 mg | ORAL_CAPSULE | Freq: Three times a day (TID) | ORAL | 1 refills | Status: DC

## 2019-04-21 MED ORDER — NICOTINE 21 MG/24HR TD PT24: MEDICATED_PATCH | TRANSDERMAL | 0 refills | Status: DC

## 2019-04-21 NOTE — Progress Notes (Signed)
Physical Therapy Session Note  Patient Details  Name: Dale Perez MRN: [id number] Date of Birth: 05-06-1955  Today's Date: 04/21/2019 PT Individual Time: 0915-1008 PT Individual Time Calculation (min): 53 min   Short Term Goals: Week 1:  PT Short Term Goal 1 (Week 1): =LTGs due to ELOS  Skilled Therapeutic Interventions/Progress Updates:   Pt received in recliner and agreeable to therapy, pain in L rib cage varies from 4-6/10 throughout session. Sit<>stands throughout session performed independently. Encouraged pt to not push up on BUEs to minimize pressure through rib cage and to push up on BLEs. Pt reports no difference in pain in between pushing up w/ UEs vs not. Pt ambulated to/from therapy gym and around unit, independent w/o AD, 150-200' at a time and very slow gait speed 2/2 pain. Increased work of breathing w/ all mobility, however pt denied SOB and reports it was 2/2 rib pain. Worked on functional endurance w/ ambulation and standing tolerance. Stood ~3 min w/o UE support to perform bimanual pipe tree task. Emphasis on breathing strategies w/ BUE use/reaching. Attempted to work on stretching of chest/rib cage by laying in reclined position on mat, pt unable to tolerate more than 30-40 sec 2/2 pain and returned to upright sitting. Educated pt on continuing to gradually work on being able to lay flatter and w/ arms open to stretch chest musculature. Pt planning to sleep in recliner until pain improves and verbalizes understanding. Practiced ambulating over ramp/uneven surfaces and over curb, all performed independently. Performed NuStep w/ BLEs only @ level 4 x5 min to work on Teaching laboratory technician. Ambulated back to room and ended session in recliner, all needs in reach.   Therapy Documentation Precautions:  Precautions Precautions: Fall Precaution Comments: watch sats Restrictions Weight Bearing Restrictions: No  Therapy/Group: Individual Therapy  Marijo Quizon K Toi Stelly 04/21/2019, 10:10 AM

## 2019-04-21 NOTE — Progress Notes (Signed)
Physical Therapy Discharge Summary  Patient Details  Name: Dale Perez MRN: [id number] Date of Birth: [birth year]  Today's Date: 04/21/2019 PT Individual Time: 1300-1408 PT Individual Time Calculation (min): 68 min    Patient has met 8 of 8 long term goals due to improved activity tolerance, improved balance, improved postural control, increased strength, decreased pain, ability to compensate for deficits and improved coordination.  Patient to discharge at an ambulatory level Independent.   Patient's care partner is independent to provide the necessary physical assistance at discharge.  Reasons goals not met: n/a  Recommendation:  Patient will benefit from ongoing skilled PT services in home health setting to continue to advance safe functional mobility, address ongoing impairments in balance, endurance, and minimize fall risk.  Equipment: No equipment provided  Reasons for discharge: treatment goals met and discharge from hospital  Patient/family agrees with progress made and goals achieved: Yes   PT treatment:  Pt received sitting in WC and agreeable to PT. Dynamic gait training to ambulate down to entrance to of [place], through hall of hospital, over cement sidewalk, through simulated food court. No cues or assist required from PT. Stair management training at grand staircase x 12 with 1 UE support and supervision assist with cues for decreased speed to reduce pain in ribs. Patient demonstrates increased fall risk as noted by score of   48/56 on Berg Balance Scale.  (<36= high risk for falls, close to 100%; 37-45 significant >80%; 46-51 moderate >50%; 52-55 lower >25%). PT instructed pt in otaga level b dynamic balance training HEP with hand out provided.   PT instructed pt in Grad day assessment to measure progress toward goals. See below for details.  Patient returned to room and left sitting in [facility] with call bell in reach and all needs met.       PT  Discharge Precautions/Restrictions    Fall  Vital Signs Therapy Vitals Temp: 97.8 F (36.6 C) Pulse Rate: 63 Resp: 18 BP: 119/72 Patient Position (if appropriate): Sitting Oxygen Therapy SpO2: 92 % O2 Device: Room Air Pain Pain Assessment Pain Scale: 0-10 Pain Score: 6  Pain Type: Acute pain Pain Location: Rib cage Pain Orientation: Left Pain Descriptors / Indicators: Aching;Discomfort;Tightness Pain Onset: On-going Pain Intervention(s): Rest;Relaxation;Other (Comment)(Breathing techniques) Vision/Perception     WFL Cognition Overall Cognitive Status: Within Functional Limits for tasks assessed Arousal/Alertness: Awake/alert Orientation Level: Oriented X4 Sensation Sensation Light Touch: Appears Intact Proprioception: Appears Intact Coordination Gross Motor Movements are Fluid and Coordinated: No Finger Nose Finger Test: limited on Lt, mostly by rib pain Motor  Motor Motor: Within Functional Limits;Other (comment) Motor - Discharge Observations: pain limiting, but greatly improved from eval  Mobility Bed Mobility Bed Mobility: Supine to Sit;Sit to Supine Rolling Right: Independent Supine to Sit: Independent Sit to Supine: Independent Transfers Transfers: Sit to Stand;Stand to Sit;Stand Pivot Transfers Sit to Stand: Independent Stand to Sit: Independent Stand Pivot Transfers: Independent Transfer (Assistive device): None Locomotion  Gait Ambulation: Yes Gait Assistance: Independent Gait Distance (Feet): 500 Feet Assistive device: None Stairs / Additional Locomotion Stairs Assistance: Supervision/Verbal cueing Stair Management Technique: One rail Right Number of Stairs: 12 Height of Stairs: 6 Wheelchair Mobility Wheelchair Mobility: No  Trunk/Postural Assessment  Cervical Assessment Cervical Assessment: Within Functional Limits Thoracic Assessment Thoracic Assessment: Exceptions to WFL(multiple rib fx) Lumbar Assessment Lumbar Assessment: Within  Functional Limits Postural Control Postural Control: Within Functional Limits  Balance Balance Balance Assessed: Yes Standardized Balance Assessment Standardized Balance Assessment: Merrilee Jansky Balance Test Merrilee Jansky  Balance Test Sit to Stand: Able to stand without using hands and stabilize independently Standing Unsupported: Able to stand safely 2 minutes Sitting with Back Unsupported but Feet Supported on Floor or Stool: Able to sit safely and securely 2 minutes Stand to Sit: Sits safely with minimal use of hands Transfers: Able to transfer safely, minor use of hands Standing Unsupported with Eyes Closed: Able to stand 10 seconds safely Standing Ubsupported with Feet Together: Able to place feet together independently and stand 1 minute safely From Standing, Reach Forward with Outstretched Arm: Can reach confidently >25 cm (10") From Standing Position, Pick up Object from Floor: Able to pick up shoe, needs supervision From Standing Position, Turn to Look Behind Over each Shoulder: Turn sideways only but maintains balance Turn 360 Degrees: Able to turn 360 degrees safely but slowly Standing Unsupported, Alternately Place Feet on Step/Stool: Able to stand independently and safely and complete 8 steps in 20 seconds Standing Unsupported, One Foot in Front: Able to plae foot ahead of the other independently and hold 30 seconds Standing on One Leg: Able to lift leg independently and hold equal to or more than 3 seconds Total Score: 48 Static Sitting Balance Static Sitting - Level of Assistance: 7: Independent Dynamic Sitting Balance Dynamic Sitting - Level of Assistance: 7: Independent Static Standing Balance Static Standing - Level of Assistance: 7: Independent Dynamic Standing Balance Dynamic Standing - Level of Assistance: 7: Independent Extremity Assessment      RLE Assessment RLE Assessment: Within Functional Limits(appears WNL, pt w/ pain in rib cage with resisted LE movements) LLE  Assessment LLE Assessment: Within Functional Limits(appears WNL, pt w/ pain in rib cage with resisted LE movements)    Lorie Phenix 04/21/2019, 2:19 PM

## 2019-04-21 NOTE — Progress Notes (Signed)
[place] PHYSICAL MEDICINE & REHABILITATION PROGRESS NOTE   Subjective/Complaints:  Chest pain with deep inspirations.  ROS- + CP, +SOB with activity, neg N/V/D  Objective:   VAS [place] LOWER EXTREMITY VENOUS (DVT)  Result Date: 04/19/2019  Lower Venous DVTStudy Indications: Swelling.  Performing Technologist: Antonieta Pert RDMS, RVT  Examination Guidelines: A complete evaluation includes B-mode imaging, spectral Doppler, color Doppler, and power Doppler as needed of all accessible portions of each vessel. Bilateral testing is considered an integral part of a complete examination. Limited examinations for reoccurring indications may be performed as noted. The reflux portion of the exam is performed with the patient in reverse Trendelenburg.  +---------+---------------+---------+-----------+----------+--------------+ RIGHT    CompressibilityPhasicitySpontaneityPropertiesThrombus Aging +---------+---------------+---------+-----------+----------+--------------+ CFV      Full           Yes      Yes                                 +---------+---------------+---------+-----------+----------+--------------+ SFJ      Full                                                        +---------+---------------+---------+-----------+----------+--------------+ FV Prox  Full                                                        +---------+---------------+---------+-----------+----------+--------------+ FV Mid   Full                                                        +---------+---------------+---------+-----------+----------+--------------+ FV DistalFull                                                        +---------+---------------+---------+-----------+----------+--------------+ PFV      Full                                                        +---------+---------------+---------+-----------+----------+--------------+ POP      Full           Yes      Yes                                  +---------+---------------+---------+-----------+----------+--------------+ PTV      Full                                                        +---------+---------------+---------+-----------+----------+--------------+  PERO     Full                                                        +---------+---------------+---------+-----------+----------+--------------+ GSV      Full                                                        +---------+---------------+---------+-----------+----------+--------------+   +---------+---------------+---------+-----------+----------+--------------+ LEFT     CompressibilityPhasicitySpontaneityPropertiesThrombus Aging +---------+---------------+---------+-----------+----------+--------------+ CFV      Full           Yes      Yes                                 +---------+---------------+---------+-----------+----------+--------------+ SFJ      Full                                                        +---------+---------------+---------+-----------+----------+--------------+ FV Prox  Full                                                        +---------+---------------+---------+-----------+----------+--------------+ FV Mid   Full                                                        +---------+---------------+---------+-----------+----------+--------------+ FV DistalFull                                                        +---------+---------------+---------+-----------+----------+--------------+ PFV      Full                                                        +---------+---------------+---------+-----------+----------+--------------+ POP      Full           Yes      Yes                                 +---------+---------------+---------+-----------+----------+--------------+ PTV      Full                                                         +---------+---------------+---------+-----------+----------+--------------+  PERO     Full                                                        +---------+---------------+---------+-----------+----------+--------------+ GSV      Full                                                        +---------+---------------+---------+-----------+----------+--------------+     Summary: BILATERAL: - No evidence of deep vein thrombosis seen in the lower extremities, bilaterally.  RIGHT: - No cystic structure found in the popliteal fossa.  LEFT: - No cystic structure found in the popliteal fossa.  *See table(s) above for measurements and observations. Electronically signed by Coral Else MD on 04/19/2019 at 9:42:21 PM.    Final    Recent Labs    04/18/19 1939 04/19/19 0516  WBC 9.0 9.4  HGB 14.2 13.4  HCT 44.0 40.9  PLT 458* 450*   Recent Labs    04/18/19 1939 04/19/19 0516  NA  --  135  K  --  4.2  CL  --  93*  CO2  --  30  GLUCOSE  --  111*  BUN  --  17  CREATININE 0.88 0.77  CALCIUM  --  8.8*    Intake/Output Summary (Last 24 hours) at 04/21/2019 1059 Last data filed at 04/21/2019 0800 Gross per 24 hour  Intake 238 ml  Output --  Net 238 ml     Physical Exam: Vital Signs Blood pressure 139/89, pulse 67, temperature 98 F (36.7 C), temperature source Oral, resp. rate 18, height 6\' 1"  (1.854 m), weight 74.3 kg, SpO2 94 %.    General: No acute distress Mood and affect are appropriate Heart: Regular rate and rhythm no rubs murmurs or extra sounds Lungs: Clear to auscultation, breathing unlabored, no rales or wheezes Abdomen: Positive bowel sounds, soft nontender to palpation, nondistended Extremities: No clubbing, cyanosis, or edema Skin: No evidence of breakdown, no evidence of rash Neurologic: Cranial nerves II through XII intact, motor strength is 5/5 in right , 4- left  deltoid, bicep, tricep, grip, hip flexor, knee extensors, ankle dorsiflexor and plantar  flexor  Musculoskeletal: Full range of motion in all 4 extremities. No joint swelling    Assessment/Plan: 1. Functional deficits secondary to Multiple left sided rib fractures  which require 3+ hours per day of interdisciplinary therapy in a comprehensive inpatient rehab setting.  Physiatrist is providing close team supervision and 24 hour management of active medical problems listed below.  Physiatrist and rehab team continue to assess barriers to discharge/monitor patient progress toward functional and medical goals  Care Tool:  Bathing  Bathing activity did not occur: Refused Body parts bathed by patient: Right arm, Left arm, Chest, Abdomen, Front perineal area, Buttocks, Right upper leg, Left upper leg, Right lower leg, Left lower leg, Face         Bathing assist Assist Level: Supervision/Verbal cueing     Upper Body Dressing/Undressing Upper body dressing   What is the patient wearing?: Pull over shirt    Upper body assist Assist Level: Independent    Lower Body Dressing/Undressing Lower body dressing  What is the patient wearing?: Pants, Underwear/pull up     Lower body assist Assist for lower body dressing: Supervision/Verbal cueing     Toileting Toileting    Toileting assist Assist for toileting: Independent     Transfers Chair/bed transfer  Transfers assist     Chair/bed transfer assist level: Independent     Locomotion Ambulation   Ambulation assist      Assist level: Independent Assistive device: No Device Max distance: >150'   Walk 10 feet activity   Assist     Assist level: Independent Assistive device: No Device   Walk 50 feet activity   Assist Walk 50 feet with 2 turns activity did not occur: Safety/medical concerns  Assist level: Independent Assistive device: No Device    Walk 150 feet activity   Assist Walk 150 feet activity did not occur: Safety/medical concerns  Assist level: Independent Assistive  device: No Device    Walk 10 feet on uneven surface  activity   Assist Walk 10 feet on uneven surfaces activity did not occur: Safety/medical concerns   Assist level: Supervision/Verbal cueing Assistive device: Other (comment)(no device)   Wheelchair     Assist Will patient use wheelchair at discharge?: No   Wheelchair activity did not occur: N/A         Wheelchair 50 feet with 2 turns activity    Assist    Wheelchair 50 feet with 2 turns activity did not occur: N/A       Wheelchair 150 feet activity     Assist  Wheelchair 150 feet activity did not occur: N/A       Blood pressure 139/89, pulse 67, temperature 98 F (36.7 C), temperature source Oral, resp. rate 18, height 6\' 1"  (1.854 m), weight 74.3 kg, SpO2 94 %.      Medical Problem List and Plan: 1.Decreased functional mobilitysecondary to fall/concussion/multiple rib fractures with occult pneumothorax/possible subcortical impaction fracture of T8. Conservative care -patient may  shower -ELOS/Goals: 7-10d CIR PT, OT Should be ready for discharge tomorrow 2. Antithrombotics: -DVT/anticoagulation:Lovenox initiated 04/10/2019. Negative vascular study -antiplatelet therapy: N/A 3. Pain Management:Tramadol 100 mg every 6 hours, Neurontin 100 mg 3 times daily, Robaxin 1000 mg 3 times daily, oxycodone as needed May have some improvement with Lido derm patch  4. Mood:Provide emotional support -antipsychotic agents: N/A 5. Neuropsych: This patientiscapable of making decisions on hisown behalf. 6. Skin/Wound Care:Routine skin checks 7. Fluids/Electrolytes/Nutrition:Routine in and outs with follow-up chemistries 8. Tobacco abuse/pneumonia. NicoDerm patch. Provide counseling. Complete course of Omnicef. Checkcurrently day 3/10. Oxygen saturations every shift 9. Transient episode atrial fibrillation. Resolved. Cardiac rate  controlled Vitals:   04/21/19 0531 04/21/19 0800  BP: 115/78 139/89  Pulse: 62 67  Resp: 17 18  Temp: 97.9 F (36.6 C) 98 F (36.7 C)  SpO2: 92% 94%   10. Constipation. Laxative assistance.  LOS: 3 days A FACE TO FACE EVALUATION WAS PERFORMED  04/23/19 04/21/2019, 10:59 AM

## 2019-04-21 NOTE — Progress Notes (Signed)
Occupational Therapy Discharge Summary  Patient Details  Name: Dale Perez MRN: [id number] Date of Birth: 1955-12-04  Today's Date: 04/21/2019 OT Individual Time: 0705-0800 OT Individual Time Calculation (min): 55 min    Patient has met 9 of 9 long term goals due to improved activity tolerance, improved balance, postural control and ability to compensate for deficits.  Patient to discharge at overall S - mod I level.  Patient's care partner is independent to provide the necessary physical assistance at discharge.    Reasons goals not met: all goals met  Recommendation:  No OT follow up needed at discharge  Equipment: Family reports having TTB  Reasons for discharge: treatment goals met  Patient/family agrees with progress made and goals achieved: Yes    OT Intervention: Upon entering the room, pt supine in bed with 6/10 c/o pain this session. Pt performed bed mobility independently and ate a few bites of food before agreeing to shower. Pt ambulating into bathroom for toileting needs at mod I level. Pt doffing clothing items and transferred onto shower chair for bathing at intermittent supervision level. Pt exiting the bathroom and performing UB self care at mod I level and LB self care at supervision level. Pt taking rest breaks as needed secondary to increased rib pain with mobility. Pt with no further concerns at this time or questions about recommendations. OT discussed home safety and energy conservation throughout session. Pt returned to supine position and student nurse present for assessment. Call bell and all needs within reach. Bed alarm activated.    OT Discharge Precautions/Restrictions  Precautions Precautions: Fall Restrictions Weight Bearing Restrictions: No Vital Signs Therapy Vitals Temp: 97.9 F (36.6 C) Pulse Rate: 62 Resp: 17 BP: 115/78 Patient Position (if appropriate): Lying Oxygen Therapy SpO2: 92 % O2 Device: Room Air Pain Pain  Assessment Pain Scale: 0-10 Pain Score: 6  Pain Type: Acute pain Pain Location: Rib cage Pain Orientation: Left Pain Descriptors / Indicators: Aching;Discomfort Pain Onset: On-going Patients Stated Pain Goal: 2 Pain Intervention(s): RN made aware;Repositioned ADL ADL Grooming: Minimal assistance Where Assessed-Grooming: Standing at sink Lower Body Dressing: Maximal assistance Where Assessed-Lower Body Dressing: Chair Toileting: Minimal assistance Where Assessed-Toileting: Glass blower/designer: Psychiatric nurse Method: Counselling psychologist: Raised toilet seat, Grab bars(BSC over toilet) ADL Comments: Pt declined bathing on eval due to pain Vision Baseline Vision/History: Wears glasses Wears Glasses: At all times Patient Visual Report: No change from baseline    Cognition Overall Cognitive Status: Impaired/Different from baseline Arousal/Alertness: Awake/alert Orientation Level: Oriented X4 Sensation Sensation Light Touch: Appears Intact Proprioception: Appears Intact Coordination Gross Motor Movements are Fluid and Coordinated: No Finger Nose Finger Test: limited on Lt, mostly by rib pain Motor  Motor Motor: Within Functional Limits Mobility  Transfers Sit to Stand: Supervision/Verbal cueing Stand to Sit: Supervision/Verbal cueing  Trunk/Postural Assessment  Cervical Assessment Cervical Assessment: Within Functional Limits Thoracic Assessment Thoracic Assessment: Within Functional Limits Lumbar Assessment Lumbar Assessment: Within Functional Limits Postural Control Postural Control: Within Functional Limits  Balance Balance Balance Assessed: Yes Static Sitting Balance Static Sitting - Level of Assistance: 7: Independent Dynamic Sitting Balance Dynamic Sitting - Level of Assistance: 7: Independent Static Standing Balance Static Standing - Level of Assistance: 5: Stand by assistance Dynamic Standing Balance Dynamic  Standing - Level of Assistance: 5: Stand by assistance Extremity/Trunk Assessment RUE Assessment RUE Assessment: Within Functional Limits LUE Assessment LUE Assessment: Within Functional Limits   Gypsy Decant 04/21/2019, 7:34 AM

## 2019-04-21 NOTE — Progress Notes (Signed)
Team Conference Report to Patient/Family  Team Conference discussion was reviewed with the patient and caregiver, including goals, any changes in plan of care and target discharge date.  Patient and caregiver express understanding and are in agreement.  The patient has a target discharge date of 04/22/19. Follow up OP PT at [facility] and wife notes she is familiar with this facility and able to transport to the sessions. Patient acknowledged plan of care.  Chana Bode B 04/21/2019, 2:24 PM

## 2019-04-21 NOTE — Progress Notes (Signed)
Verbal order given per Dr. Wynn Banker to remove IV access this morning.

## 2019-04-21 NOTE — Plan of Care (Signed)
  Problem: RH Balance Goal: LTG Patient will maintain dynamic standing balance (PT) Description: LTG:  Patient will maintain dynamic standing balance with assistance during mobility activities (PT) Outcome: Completed/Met   Problem: Sit to Stand Goal: LTG:  Patient will perform sit to stand with assistance level (PT) Description: LTG:  Patient will perform sit to stand with assistance level (PT) Outcome: Completed/Met   Problem: RH Bed Mobility Goal: LTG Patient will perform bed mobility with assist (PT) Description: LTG: Patient will perform bed mobility with assistance, with/without cues (PT). Outcome: Completed/Met   Problem: RH Bed to Chair Transfers Goal: LTG Patient will perform bed/chair transfers w/assist (PT) Description: LTG: Patient will perform bed to chair transfers with assistance (PT). Outcome: Completed/Met   Problem: RH Car Transfers Goal: LTG Patient will perform car transfers with assist (PT) Description: LTG: Patient will perform car transfers with assistance (PT). Outcome: Completed/Met   Problem: RH Ambulation Goal: LTG Patient will ambulate in controlled environment (PT) Description: LTG: Patient will ambulate in a controlled environment, # of feet with assistance (PT). Outcome: Completed/Met Goal: LTG Patient will ambulate in home environment (PT) Description: LTG: Patient will ambulate in home environment, # of feet with assistance (PT). Outcome: Completed/Met   Problem: RH Stairs Goal: LTG Patient will ambulate up and down stairs w/assist (PT) Description: LTG: Patient will ambulate up and down # of stairs with assistance (PT) Outcome: Completed/Met    Barrie Folk PT, DPT  4:22 PM 04/21/19

## 2019-04-22 ENCOUNTER — Telehealth: Payer: Self-pay | Admitting: Registered Nurse

## 2019-04-22 ENCOUNTER — Telehealth: Payer: Self-pay | Admitting: Physician Assistant

## 2019-04-22 DIAGNOSIS — R5381 Other malaise: Secondary | ICD-10-CM

## 2019-04-22 MED ORDER — OXYCODONE HCL 10 MG PO TABS: 10.0000 mg | ORAL_TABLET | ORAL | 0 refills | Status: DC | PRN

## 2019-04-22 MED ORDER — GABAPENTIN 100 MG PO CAPS: 100.0000 mg | ORAL_CAPSULE | Freq: Three times a day (TID) | ORAL | 1 refills | Status: DC

## 2019-04-22 MED ORDER — METHOCARBAMOL 500 MG PO TABS: 1000.0000 mg | ORAL_TABLET | Freq: Three times a day (TID) | ORAL | 0 refills | Status: DC

## 2019-04-22 MED ORDER — LIDOCAINE 5 % EX PTCH: 2.0000 | MEDICATED_PATCH | Freq: Every day | CUTANEOUS | 0 refills | Status: DC

## 2019-04-22 NOTE — Telephone Encounter (Signed)
Cancel patient's initial prescription to [facility] pharmacy due to insurance issues with new prescription initially sent to [facility] on [address] however they did not have oxycodone thus new prescription obtain to [facility] [address] that did accept a new prescription for oxycodone 10 mg every 4 hours as needed dispensing of 30 tablets.  Wife was contacted on these changes.

## 2019-04-22 NOTE — Telephone Encounter (Signed)
Delle Reining PA called office, having problem placing order of Oxycodone for Mr. Dutan. PMP was reviewed. The [facility] on [address]  didn't have Oxycodone. Oxycodone e-scribe to [facility] on [address]. Jesusita Oka will call and speak with Mrs. Cloe regarding the above.

## 2019-04-22 NOTE — Progress Notes (Signed)
Patient and spouse received discharge instructions from Dan Angiulli, PA-C with verbal understanding. Patient discharged to home with spouse and patient belongings. 

## 2019-04-22 NOTE — Care Management (Signed)
   The overall goal for the admission was met for:   Discharge location:Home with his wife  Length of Stay: 4 days with discharge on 04/22/19  Discharge activity level: Patient to discharge at an ambulatory level Independent.   Home/community participation:Limited Participation  Services provided included: MD, RD, PT, OT, SLP, RN, CM, Pharmacy, Neuropsych and SW  Financial Services: Private Insurance: [employer]  Follow-up services arranged: Outpatient: [place]  and Patient/Family has no preference for HH/DME agencies  Comments (or additional information):[place] [phone]  Patient/Family verbalized understanding of follow-up arrangements: Yes   Patient's care partner is independent to provide the necessary physical assistance at discharge  Individual responsible for coordination of the follow-up plan: Wife  Lori Dagley:[phone]    Margarito Liner

## 2019-04-22 NOTE — Discharge Instructions (Signed)
Inpatient Rehab Discharge Instructions  Dale Perez Discharge date and time: No discharge date for patient encounter.   Activities/Precautions/ Functional Status: Activity: activity as tolerated Diet: regular diet Wound Care: none needed Functional status:  ___ No restrictions     ___ Walk up steps independently ___ 24/7 supervision/assistance   ___ Walk up steps with assistance ___ Intermittent supervision/assistance  ___ Bathe/dress independently ___ Walk with walker     _x__ Bathe/dress with assistance ___ Walk Independently    ___ Shower independently ___ Walk with assistance    ___ Shower with assistance ___ No alcohol     ___ Return to work/school ________  COMMUNITY REFERRALS UPON DISCHARGE:  Outpatient: PT  Agency:[facility] Phone:[phone]  Appointment Date/Time:[facility] OP rehab will call to set up an appointment  Special Instructions:  No driving smoking or alcohol  Follow Dr. 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 I will adhere to these goals and the provided educational materials after my discharge from the hospital.  Patient/Caregiver Signature _______________________________ Date __________  Clinician Signature _______________________________________ Date __________  Please bring this form and your medication list with you to all your follow-up doctor's appointments.

## 2019-04-22 NOTE — Progress Notes (Signed)
[facility] PHYSICAL MEDICINE & REHABILITATION PROGRESS NOTE   Subjective/Complaints:  Pt doesn't think the Lidoderm is helpful   ROS- + CP, +SOB with activity, neg N/V/D  Objective:   No results found. No results for input(s): WBC, HGB, HCT, PLT in the last 72 hours. No results for input(s): NA, K, CL, CO2, GLUCOSE, BUN, CREATININE, CALCIUM in the last 72 hours.  Intake/Output Summary (Last 24 hours) at 04/22/2019 0910 Last data filed at 04/21/2019 1843 Gross per 24 hour  Intake 444 ml  Output --  Net 444 ml     Physical Exam: Vital Signs Blood pressure 117/73, pulse 65, temperature 98.6 F (37 C), resp. rate 18, height 6\' 1"  (1.854 m), weight 74.3 kg, SpO2 94 %.      General: No acute distress Mood and affect are appropriate Heart: Regular rate and rhythm no rubs murmurs or extra sounds Lungs: Clear to auscultation, breathing unlabored, no rales or wheezes Abdomen: Positive bowel sounds, soft nontender to palpation, nondistended Extremities: No clubbing, cyanosis, or edema Skin: No evidence of breakdown, no evidence of rash  Neurologic: Cranial nerves II through XII intact, motor strength is 5/5 in right , 4- left  deltoid, bicep, tricep, grip, hip flexor, knee extensors, ankle dorsiflexor and plantar flexor  Musculoskeletal: Full range of motion in all 4 extremities. No joint swelling    Assessment/Plan: 1. Functional deficits secondary to Multiple left sided rib fractures  Stable for D/C today F/u PCP in 3-4 weeks F/u PM&R 2 weeks See D/C summary See D/C instructions Care Tool:  Bathing  Bathing activity did not occur: Refused Body parts bathed by patient: Right arm, Left arm, Chest, Abdomen, Front perineal area, Buttocks, Right upper leg, Left upper leg, Right lower leg, Left lower leg, Face         Bathing assist Assist Level: Supervision/Verbal cueing     Upper Body Dressing/Undressing Upper body dressing   What is the patient wearing?: Pull over  shirt    Upper body assist Assist Level: Independent    Lower Body Dressing/Undressing Lower body dressing      What is the patient wearing?: Pants, Underwear/pull up     Lower body assist Assist for lower body dressing: Supervision/Verbal cueing     Toileting Toileting    Toileting assist Assist for toileting: Independent     Transfers Chair/bed transfer  Transfers assist     Chair/bed transfer assist level: Independent     Locomotion Ambulation   Ambulation assist      Assist level: Independent Assistive device: No Device Max distance: 500   Walk 10 feet activity   Assist     Assist level: Independent Assistive device: No Device   Walk 50 feet activity   Assist Walk 50 feet with 2 turns activity did not occur: Safety/medical concerns  Assist level: Independent with assistive device Assistive device: No Device    Walk 150 feet activity   Assist Walk 150 feet activity did not occur: Safety/medical concerns  Assist level: Independent with assistive device Assistive device: No Device    Walk 10 feet on uneven surface  activity   Assist Walk 10 feet on uneven surfaces activity did not occur: Safety/medical concerns   Assist level: Independent with assistive device Assistive device: Other (comment)(no device)   Wheelchair     Assist Will patient use wheelchair at discharge?: No   Wheelchair activity did not occur: N/A         Wheelchair 50 feet with 2  turns activity    Assist    Wheelchair 50 feet with 2 turns activity did not occur: N/A       Wheelchair 150 feet activity     Assist  Wheelchair 150 feet activity did not occur: N/A       Blood pressure 117/73, pulse 65, temperature 98.6 F (37 C), resp. rate 18, height 6\' 1"  (1.854 m), weight 74.3 kg, SpO2 94 %.      Medical Problem List and Plan: 1.Decreased functional mobilitysecondary to fall/concussion/multiple rib fractures with occult  pneumothorax/possible subcortical impaction fracture of T8. Conservative care  CIR PT, OT Should be ready for discharge today  2. Antithrombotics: -DVT/anticoagulation:Lovenox initiated 04/10/2019. Negative vascular study -antiplatelet therapy: N/A 3. Pain Management:Tramadol 100 mg every 6 hours, Neurontin 100 mg 3 times daily, Robaxin 1000 mg 3 times daily, oxycodone as needed  4. Mood:Provide emotional support -antipsychotic agents: N/A 5. Neuropsych: This patientiscapable of making decisions on hisown behalf. 6. Skin/Wound Care:Routine skin checks 7. Fluids/Electrolytes/Nutrition:Routine in and outs with follow-up chemistries 8. Tobacco abuse/pneumonia. NicoDerm patch. Provide counseling. Complete course of Omnicef. Checkcurrently day 3/10. Oxygen saturations every shift 9. Transient episode atrial fibrillation. Resolved. Cardiac rate controlled Vitals:   04/21/19 2149 04/22/19 0456  BP: 118/73 117/73  Pulse: 65 65  Resp: 19 18  Temp: 99.4 F (37.4 C) 98.6 F (37 C)  SpO2: 96% 94%   10. Constipation. Laxative assistance.  LOS: 4 days A FACE TO [place] 04/22/2019, 9:10 AM

## 2019-04-26 DIAGNOSIS — S060X9S Concussion with loss of consciousness of unspecified duration, sequela: Secondary | ICD-10-CM | POA: Diagnosis not present

## 2019-04-26 DIAGNOSIS — I714 Abdominal aortic aneurysm, without rupture: Secondary | ICD-10-CM | POA: Diagnosis not present

## 2019-04-26 DIAGNOSIS — S2231XD Fracture of one rib, right side, subsequent encounter for fracture with routine healing: Secondary | ICD-10-CM | POA: Diagnosis not present

## 2019-04-26 DIAGNOSIS — W19XXXD Unspecified fall, subsequent encounter: Secondary | ICD-10-CM | POA: Diagnosis not present

## 2019-04-27 ENCOUNTER — Ambulatory Visit (HOSPITAL_COMMUNITY): Payer: BC Managed Care – PPO | Attending: Physician Assistant | Admitting: Physical Therapy

## 2019-04-27 ENCOUNTER — Encounter (HOSPITAL_COMMUNITY): Payer: Self-pay | Admitting: Physical Therapy

## 2019-04-27 ENCOUNTER — Other Ambulatory Visit: Payer: Self-pay

## 2019-04-27 DIAGNOSIS — S2232XA Fracture of one rib, left side, initial encounter for closed fracture: Secondary | ICD-10-CM | POA: Insufficient documentation

## 2019-04-27 DIAGNOSIS — R2689 Other abnormalities of gait and mobility: Secondary | ICD-10-CM | POA: Diagnosis not present

## 2019-04-27 DIAGNOSIS — S2242XS Multiple fractures of ribs, left side, sequela: Secondary | ICD-10-CM | POA: Insufficient documentation

## 2019-04-27 DIAGNOSIS — R293 Abnormal posture: Secondary | ICD-10-CM | POA: Diagnosis not present

## 2019-04-27 NOTE — Therapy (Signed)
[facility] [address] [place], [place], [address] Phone: [phone]   Fax:  [phone]  Physical Therapy Evaluation  Patient Details  Name: Dale Perez MRN: [id number] Date of Birth: 07-02-1955 Referring Provider (PT): Mariam Dollar PA-C   Encounter Date: 04/27/2019  PT End of Session - 04/27/19 1545    Visit Number  1    Number of Visits  12    Date for PT Re-Evaluation  06/08/19    Authorization Type  BCBS COMM PPO (no v.l. no auth, codes 97535, 97750, A9300 not covered)    Progress Note Due on Visit  10    PT Start Time  1430    PT Stop Time  1515    PT Time Calculation (min)  45 min    Activity Tolerance  Patient limited by pain    Behavior During Therapy  Restless;Anxious;Flat affect       History reviewed. No pertinent past medical history.  Past Surgical History:  Procedure Laterality Date  . APPENDECTOMY    . I & D EXTREMITY Left 08/10/2015   Procedure: IRRIGATION AND DEBRIDEMENT EXTREMITY;  Surgeon: Mack Hook, MD;  Location: [facility] OR;  Service: Orthopedics;  Laterality: Left;  . KNEE CARTILAGE SURGERY Right 02/2019  . KNEE SURGERY    . SHOULDER ARTHROSCOPY Left 08/2018  . SHOULDER SURGERY    . TENDON REPAIR N/A 08/10/2015   Procedure: TENDON REPAIR WITH WOUND CLOSURE;  Surgeon: Mack Hook, MD;  Location: [facility] OR;  Service: Orthopedics;  Laterality: N/A;  . TONSILLECTOMY      There were no vitals filed for this visit.   Subjective Assessment - 04/27/19 1444    Subjective  Patient is a 64 y.o. male who presents to physical therapy L rib/flank/chest pain due to 10 rib fractures after a fall from a latter on March 6. He reports he hurt his shoulder and knee May of 2020 and had surgery on both due to injuries sustained while working. His rib injury began on March 6th 2021 when he fell from a latter while trying to remove a tree limb from the tree. He received therapy while in hospital and inpatient rehab where  he was discharged on 04/22/19. He has an abdominal aortic aneurysm. Patient is having trouble breathing, he is unable to lay flat, and he is having trouble with all ADLs at this time. It just takes him a little longer to do everything. He is on pain medicines. His pain is "un godly".    Pertinent History  Rib Fx, AAA, recent shoulder surgery    Limitations  Other (comment);House hold activities   breathing   Patient Stated Goals  To get back on his feet    Currently in Pain?  Yes    Pain Score  5    worst: 8/10   Pain Location  Rib cage    Pain Orientation  Left    Pain Descriptors / Indicators  Sharp    Pain Type  Acute pain    Pain Onset  1 to 4 weeks ago    Pain Frequency  Constant         [facility] PT Assessment - 04/27/19 0001      Assessment   Medical Diagnosis  Debility/ L Rib Fractures    Referring Provider (PT)  Mariam Dollar PA-C    Onset Date/Surgical Date  04/09/19    Next MD Visit  None    Prior Therapy  Yes, knees,  shoulders; recently in hospital and CIR      Precautions   Precautions  Other (comment)   Rib Fractures     Restrictions   Weight Bearing Restrictions  No      Balance Screen   Has the patient fallen in the past 6 months  Yes    How many times?  1    Has the patient had a decrease in activity level because of a fear of falling?   No    Is the patient reluctant to leave their home because of a fear of falling?   No      Home Film/video editor residence    Living Arrangements  Spouse/significant other      Prior Function   Level of Independence  Independent    Vocation Requirements  was on disability, currently not, was workers comp      Charity fundraiser Status  Within Functional Limits for tasks assessed      Observation/Other Assessments   Observations  No visable bruising, guarded movement with transfers ambulation, constantly changing position, Tachypnea    Focus on Therapeutic Outcomes (FOTO)   67%  limited      Coordination   Gross Motor Movements are Fluid and Coordinated  No    Finger Nose Finger Test  --      Posture/Postural Control   Posture/Postural Control  Postural limitations    Postural Limitations  Rounded Shoulders;Forward head    Posture Comments  rigid posture      ROM / Strength   AROM / PROM / Strength  AROM;Strength      AROM   Overall AROM   Deficits;Due to pain    Overall AROM Comments  Limited by L sided pain throughout ribs; R shoulder WFL, L shoulder limited with overhead elevation due to pain; most ROM comes from lumbar spine    AROM Assessment Site  Thoracic    Thoracic Flexion  90% limited    Thoracic Extension  90% limited    Thoracic - Right Side Bend  75% limited    Thoracic - Left Side Bend  75% limited    Thoracic - Right Rotation  75 % limited    Thoracic - Left Rotation  75% limited      Strength   Strength Assessment Site  Hip;Knee;Ankle    Right/Left Hip  Right;Left    Right Hip Flexion  4/5    Left Hip Flexion  4/5   pain in ribs   Right/Left Knee  Right;Left    Right Knee Flexion  5/5    Right Knee Extension  5/5    Left Knee Flexion  5/5    Left Knee Extension  5/5    Right/Left Ankle  Right;Left    Right Ankle Dorsiflexion  5/5    Left Ankle Dorsiflexion  5/5      Palpation   Palpation comment  No tenderness with light palpation to anterior chest, anterior and lateral ribs, posterior ribs, thoracic and lumbar paraspinals, L QL; tenderness with deeper palapation to anterior upper ribs, lateral and posterior lower ribs      Transfers   Transfers  Sit to Stand;Stand to Sit    Comments  rigid transfers with limited truncal movement      Ambulation/Gait   Ambulation/Gait  Yes    Ambulation/Gait Assistance  7: Independent    Ambulation Distance (Feet)  120 Feet    Assistive device  None    Gait Comments  slower, labored, decreased truncal and UE movement                Objective measurements completed on  examination: See above findings.              PT Education - 04/27/19 1525    Education Details  Patient educated on exam findings, POC, scope of PT, pursed lip breathing with functional activity, rib anatomy and fractures, bone healing, taking pain medication as prescribed, limiting painful movements.    Person(s) Educated  Patient    Methods  Explanation;Demonstration    Comprehension  Verbalized understanding       PT Short Term Goals - 04/27/19 1558      PT SHORT TERM GOAL #1   Title  Patient will be independent with HEP in order to improve functional outcomes.    Time  3    Period  Weeks    Status  New    Target Date  05/18/19      PT SHORT TERM GOAL #2   Title  Patient will report at least 25% improvement in symptoms for improved quality of life.    Time  3    Period  Weeks    Status  New    Target Date  05/18/19        PT Long Term Goals - 04/27/19 1559      PT LONG TERM GOAL #1   Title  Patient will report at least 75% improvement in symptoms for improved quality of life.    Time  6    Period  Weeks    Status  New    Target Date  06/08/19      PT LONG TERM GOAL #2   Title  Patient will improve FOTO score by at least 5 points in order to indicate improved tolerance to activity.    Time  6    Period  Weeks    Status  New    Target Date  06/08/19      PT LONG TERM GOAL #3   Title  Patient will improve thoracic mobility by at least 50% in all planes in order to complete at with increased ease.    Time  6    Period  Weeks    Status  New    Target Date  06/08/19      PT LONG TERM GOAL #4   Title  Patient pain will be no greater than 2/10 with ADL for improved ability to return to work.    Time  6    Period  Weeks    Status  New    Target Date  06/08/19             Plan - 04/27/19 1549    Clinical Impression Statement  Patient is a 64 y.o. male who presents to physical therapy L rib/flank pain due to 10 rib fractures from a fall on April 09, 2019. He presents with pain limited deficits in trunk strength, ROM, endurance, postural impairments, spinal mobility and functional mobility with ADL. He is having to modify and restrict ADL as indicated by FOTO score as well as subjective information and objective measures which is affecting overall participation. Patient will benefit from skilled physical therapy in order to improve function and reduce impairment.    Personal Factors and Comorbidities  Behavior Pattern;Profession;Past/Current Experience;Social Background;Time since onset of injury/illness/exacerbation;Comorbidity 2    Comorbidities  AAA, recent shoulder and knee injuries/surguries    Examination-Activity Limitations  Bathing;Bed Mobility;Bend;Caring for Others;Carry;Dressing;Hygiene/Grooming;Lift;Locomotion Level;Reach Overhead;Sit;Sleep;Squat;Stairs;Stand;Transfers    Examination-Participation Restrictions  Cleaning;Community Activity;Driving;Interpersonal Relationship;Laundry;Shop;Volunteer;Yard Work    Conservation officer, historic buildings  Evolving/Moderate complexity    Clinical Decision Making  Moderate    Rehab Potential  Fair    PT Frequency  2x / week   1x/week for 1 week, 2x/week for 5 weeks for 6 weeks overall   PT Duration  6 weeks    PT Treatment/Interventions  ADLs/Self Care Home Management;Aquatic Therapy;Cryotherapy;Electrical Stimulation;Iontophoresis 4mg /ml Dexamethasone;Moist Heat;Traction;Ultrasound;Gait training;DME Instruction;Stair training;Functional mobility training;Therapeutic activities;Therapeutic exercise;Balance training;Neuromuscular re-education;Patient/family education;Manual techniques;Manual lymph drainage;Compression bandaging;Scar mobilization;Passive range of motion;Dry needling;Energy conservation;Splinting;Taping;Joint Manipulations;Spinal Manipulations    PT Next Visit Plan  initiate HEP, instruct in breathing techniques, gentle mobility exercises as able, postural strengthening and core  strengthening as able, gentle STM for pain relief    PT Home Exercise Plan  initiate next session    Consulted and Agree with Plan of Care  Patient       Patient will benefit from skilled therapeutic intervention in order to improve the following deficits and impairments:  Abnormal gait, Decreased endurance, Hypomobility, Decreased activity tolerance, Decreased mobility, Decreased range of motion, Decreased strength, Increased fascial restricitons, Increased muscle spasms, Impaired flexibility, Impaired UE functional use, Postural dysfunction, Pain  Visit Diagnosis: Other abnormalities of gait and mobility  Closed fracture of multiple ribs of left side, sequela  Abnormal posture     Problem List Patient Active Problem List   Diagnosis Date Noted  . Debility 04/18/2019  . Left rib fracture 04/09/2019    4:25 PM, 04/27/19 04/29/19 PT, DPT Physical Therapist at [facility]   [facility] [address] [place], [place], [place] Phone: [phone]   Fax:  [phone]  Name: Dale Perez MRN: [name] Date of Birth: March 30, 1955

## 2019-05-04 ENCOUNTER — Other Ambulatory Visit: Payer: Self-pay

## 2019-05-04 ENCOUNTER — Encounter (HOSPITAL_COMMUNITY): Payer: Self-pay | Admitting: Physical Therapy

## 2019-05-04 ENCOUNTER — Ambulatory Visit (HOSPITAL_COMMUNITY): Payer: BC Managed Care – PPO | Admitting: Physical Therapy

## 2019-05-04 DIAGNOSIS — R293 Abnormal posture: Secondary | ICD-10-CM

## 2019-05-04 DIAGNOSIS — S2242XS Multiple fractures of ribs, left side, sequela: Secondary | ICD-10-CM | POA: Diagnosis not present

## 2019-05-04 DIAGNOSIS — S2232XA Fracture of one rib, left side, initial encounter for closed fracture: Secondary | ICD-10-CM | POA: Diagnosis not present

## 2019-05-04 DIAGNOSIS — R2689 Other abnormalities of gait and mobility: Secondary | ICD-10-CM

## 2019-05-04 NOTE — Therapy (Signed)
[facility] [address] [place], [place], [address] Phone: [phone]   Fax:  [phone]  Physical Therapy Treatment  Patient Details  Name: Dale Perez MRN: [id number] Date of Birth: 01-Sep-1955 Referring Provider (PT): Mariam Dollar PA-C   Encounter Date: 05/04/2019  PT End of Session - 05/04/19 1605    Visit Number  2    Number of Visits  12    Date for PT Re-Evaluation  06/08/19    Authorization Type  BCBS COMM PPO (no v.l. no auth, codes 97535, 97750, A9300 not covered)    Progress Note Due on Visit  10    PT Start Time  1517    PT Stop Time  1558    PT Time Calculation (min)  41 min    Activity Tolerance  Patient limited by pain    Behavior During Therapy  Restless;Anxious;Flat affect       History reviewed. No pertinent past medical history.  Past Surgical History:  Procedure Laterality Date  . APPENDECTOMY    . I & D EXTREMITY Left 08/10/2015   Procedure: IRRIGATION AND DEBRIDEMENT EXTREMITY;  Surgeon: Mack Hook, MD;  Location: [facility] OR;  Service: Orthopedics;  Laterality: Left;  . KNEE CARTILAGE SURGERY Right 02/2019  . KNEE SURGERY    . SHOULDER ARTHROSCOPY Left 08/2018  . SHOULDER SURGERY    . TENDON REPAIR N/A 08/10/2015   Procedure: TENDON REPAIR WITH WOUND CLOSURE;  Surgeon: Mack Hook, MD;  Location[facility] OR;  Service: Orthopedics;  Laterality: N/A;  . TONSILLECTOMY      There were no vitals filed for this visit.  Subjective Assessment - 05/04/19 1517    Subjective  Patient reports he is stiff and that his pain is about 4/10. He is about the same as last week. His breathing has been getting better. He is having extreme pain with coughing.    Pertinent History  Rib Fx, AAA, recent shoulder surgery    Limitations  Other (comment);House hold activities   breathing   Patient Stated Goals  To get back on his feet    Pain Score  4     Pain Location  Rib cage    Pain Onset  1 to 4 weeks ago                        [facility] Adult PT Treatment/Exercise - 05/04/19 0001      Exercises   Exercises  Lumbar      Lumbar Exercises: Stretches   Other Lumbar Stretch Exercise  thoracic lateral flexion 10x 10 second hold each way, doorway pec minor stretch 10x10 second holds    Other Lumbar Stretch Exercise  thoracic rotation in seated 10x bilateral , thoracic extension over chair 10x10 second holds, shoulder flexion stretch at counter 10x10 second holds             PT Education - 05/04/19 1603    Education Details  Patient educated on HEP, proper Administrator) Educated  Patient    Methods  Explanation;Handout;Demonstration    Comprehension  Verbalized understanding       PT Short Term Goals - 04/27/19 1558      PT SHORT TERM GOAL #1   Title  Patient will be independent with HEP in order to improve functional outcomes.    Time  3    Period  Weeks    Status  New  Target Date  05/18/19      PT SHORT TERM GOAL #2   Title  Patient will report at least 25% improvement in symptoms for improved quality of life.    Time  3    Period  Weeks    Status  New    Target Date  05/18/19        PT Long Term Goals - 04/27/19 1559      PT LONG TERM GOAL #1   Title  Patient will report at least 75% improvement in symptoms for improved quality of life.    Time  6    Period  Weeks    Status  New    Target Date  06/08/19      PT LONG TERM GOAL #2   Title  Patient will improve FOTO score by at least 5 points in order to indicate improved tolerance to activity.    Time  6    Period  Weeks    Status  New    Target Date  06/08/19      PT LONG TERM GOAL #3   Title  Patient will improve thoracic mobility by at least 50% in all planes in order to complete at with increased ease.    Time  6    Period  Weeks    Status  New    Target Date  06/08/19      PT LONG TERM GOAL #4   Title  Patient pain will be no greater than 2/10 with ADL for improved ability to  return to work.    Time  6    Period  Weeks    Status  New    Target Date  06/08/19            Plan - 05/04/19 1607    Clinical Impression Statement  Patient continues to be limited by pain and stiffness throughout L Ribs/trunk. Patient tolerates session well and is able to complete all stretches with moderate discomfort. He is shown proper mechanics and is able to demonstrate appropriate mechanics following cueing. Patient continues to try to remain as active as possible with tasks/ADL at home. Since patient is active, he is given stretches for HEP and will complete those independently next and will follow up following week with progress. Patient will continue to benefit from skilled physical therapy in order to improve function and reduce impairment.    Personal Factors and Comorbidities  Behavior Pattern;Profession;Past/Current Experience;Social Background;Time since onset of injury/illness/exacerbation;Comorbidity 2    Comorbidities  AAA, recent shoulder and knee injuries/surguries    Examination-Activity Limitations  Bathing;Bed Mobility;Bend;Caring for Others;Carry;Dressing;Hygiene/Grooming;Lift;Locomotion Level;Reach Overhead;Sit;Sleep;Squat;Stairs;Stand;Transfers    Examination-Participation Restrictions  Cleaning;Community Activity;Driving;Interpersonal Relationship;Laundry;Shop;Volunteer;Yard Work    Merchant navy officer  Evolving/Moderate complexity    Rehab Potential  Fair    PT Frequency  2x / week   1x/week for 1 week, 2x/week for 5 weeks for 6 weeks overall   PT Duration  6 weeks    PT Treatment/Interventions  ADLs/Self Care Home Management;Aquatic Therapy;Cryotherapy;Electrical Stimulation;Iontophoresis 4mg /ml Dexamethasone;Moist Heat;Traction;Ultrasound;Gait training;DME Instruction;Stair training;Functional mobility training;Therapeutic activities;Therapeutic exercise;Balance training;Neuromuscular re-education;Patient/family education;Manual techniques;Manual  lymph drainage;Compression bandaging;Scar mobilization;Passive range of motion;Dry needling;Energy conservation;Splinting;Taping;Joint Manipulations;Spinal Manipulations    PT Next Visit Plan  instruct in breathing techniques, gentle mobility exercises as able, postural strengthening and core strengthening as able, gentle STM for pain relief    PT Home Exercise Plan  thoracic rotation in seated 10x bilateral , thoracic extension over chair 10x10 second holds, shoulder  flexion stretch at counter 10x10 second holds, thoracic lateral flexion 10x 10 second hold each way, doorway pec minor stretch 10x10 second holds    Consulted and Agree with Plan of Care  Patient       Patient will benefit from skilled therapeutic intervention in order to improve the following deficits and impairments:  Abnormal gait, Decreased endurance, Hypomobility, Decreased activity tolerance, Decreased mobility, Decreased range of motion, Decreased strength, Increased fascial restricitons, Increased muscle spasms, Impaired flexibility, Impaired UE functional use, Postural dysfunction, Pain  Visit Diagnosis: Other abnormalities of gait and mobility  Closed fracture of multiple ribs of left side, sequela  Abnormal posture  Closed fracture of one rib of left side, initial encounter     Problem List Patient Active Problem List   Diagnosis Date Noted  . Debility 04/18/2019  . Left rib fracture 04/09/2019    4:18 PM, 05/04/19 Wyman Songster PT, DPT Physical Therapist at [facility]   [facility] [facility] [address] [place], [place], [address] Phone: [phone]   Fax:  [phone]  Name: Dale Perez MRN: [id number] Date of Birth: 11/15/1955

## 2019-05-04 NOTE — Patient Instructions (Signed)
Access Code: [email or web address] URL: [email or web address] Date: 05/04/2019 Prepared by: Greig Castilla Romey Cohea  Exercises Seated Sidebending - 1 x daily - 7 x weekly - 10 reps - 10 second hold Seated Thoracic Flexion and Rotation with Arms Crossed - 1 x daily - 7 x weekly - 10 reps - 5-10 second hold Seated Thoracic Lumbar Extension - 1 x daily - 7 x weekly - 10 reps - 5-10 second holds hold Doorway Pec Stretch at 60 Elevation - 1 x daily - 7 x weekly - 10 reps - 10 second hold Standing 'L' Stretch at Counter - 1 x daily - 7 x weekly - 10 reps - 5-10 second hold

## 2019-05-09 ENCOUNTER — Ambulatory Visit (HOSPITAL_COMMUNITY): Payer: BC Managed Care – PPO | Admitting: Physical Therapy

## 2019-05-10 ENCOUNTER — Other Ambulatory Visit: Payer: Self-pay

## 2019-05-10 ENCOUNTER — Encounter: Payer: Self-pay | Admitting: Vascular Surgery

## 2019-05-10 ENCOUNTER — Ambulatory Visit (INDEPENDENT_AMBULATORY_CARE_PROVIDER_SITE_OTHER): Payer: BC Managed Care – PPO | Admitting: Vascular Surgery

## 2019-05-10 DIAGNOSIS — I714 Abdominal aortic aneurysm, without rupture, unspecified: Secondary | ICD-10-CM | POA: Insufficient documentation

## 2019-05-10 NOTE — Progress Notes (Signed)
Patient name: Dale Perez MRN: [id number] DOB: 01-Jun-1955 Sex: male  REASON FOR CONSULT: Evaluate 4.4 cm abdominal aortic aneurysm  HPI: Dale Perez is a 64 y.o. male, with history of tobacco abuse who presents for evaluation of abdominal aortic aneurysm discovered incidentally after fall.  Patient recently fell 17 feet out of a tree and underwent trauma work-up.  Ultimately he sustained number of rib fractures and was found to have a 4.4 cm abdominal aortic aneurysm on a CT from 04/11/2019.  He denies any prior knowledge of his aneurysm.  He has had only an appendectomy for abdominal surgery.  No lower abdominal or back pain at this time.  All of his pain is in his chest wall where his broken ribs are.  States he quit smoking after his fall.  History reviewed. No pertinent past medical history.  Past Surgical History:  Procedure Laterality Date   APPENDECTOMY     I & D EXTREMITY Left 08/10/2015   Procedure: IRRIGATION AND DEBRIDEMENT EXTREMITY;  Surgeon: Mack Hook, MD;  Location: [facility] OR;  Service: Orthopedics;  Laterality: Left;   KNEE CARTILAGE SURGERY Right 02/2019   KNEE SURGERY     SHOULDER ARTHROSCOPY Left 08/2018   SHOULDER SURGERY     TENDON REPAIR N/A 08/10/2015   Procedure: TENDON REPAIR WITH WOUND CLOSURE;  Surgeon: Mack Hook, MD;  Location: [facility] OR;  Service: Orthopedics;  Laterality: N/A;   TONSILLECTOMY      Family History  Problem Relation Age of Onset   Heart attack Father     SOCIAL HISTORY: Social History   Socioeconomic History   Marital status: Married    Spouse name: Not on file   Number of children: Not on file   Years of education: Not on file   Highest education level: Not on file  Occupational History   Not on file  Tobacco Use   Smoking status: Current Every Day Smoker    Packs/day: 1.00    Years: 40.00    Pack years: 40.00    Types: Cigarettes   Smokeless tobacco: Never Used  Substance and Sexual Activity    Alcohol use: Yes    Alcohol/week: 1.0 standard drinks    Types: 1 Cans of beer per week   Drug use: Never   Sexual activity: Yes  Other Topics Concern   Not on file  Social History Narrative   ** Merged History Encounter **       Social Determinants of Health   Financial Resource Strain:    Difficulty of Paying Living Expenses:   Food Insecurity:    Worried About Programme researcher, broadcasting/film/video in the Last Year:    Barista in the Last Year:   Transportation Needs:    Freight forwarder (Medical):    Lack of Transportation (Non-Medical):   Physical Activity:    Days of Exercise per Week:    Minutes of Exercise per Session:   Stress:    Feeling of Stress :   Social Connections:    Frequency of Communication with Friends and Family:    Frequency of Social Gatherings with Friends and Family:    Attends Religious Services:    Active Member of Clubs or Organizations:    Attends Banker Meetings:    Marital Status:   Intimate Partner Violence:    Fear of Current or Ex-Partner:    Emotionally Abused:    Physically Abused:    Sexually Abused:  Allergies  Allergen Reactions   Aspirin    Penicillins    Quinolones     Patient has an AAA   Aspirin Hives, Rash and Other (See Comments)    Current Outpatient Medications  Medication Sig Dispense Refill   acetaminophen (TYLENOL) 325 MG tablet Take 1-2 tablets (325-650 mg total) by mouth every 4 (four) hours as needed for mild pain.     docusate sodium (COLACE) 100 MG capsule Take 1 capsule (100 mg total) by mouth 2 (two) times daily. 10 capsule 0   gabapentin (NEURONTIN) 100 MG capsule Take 1 capsule (100 mg total) by mouth 3 (three) times daily. 90 capsule 1   lidocaine (LIDODERM) 5 % Place 2 patches onto the skin daily. Remove & Discard patch within 12 hours or as directed by MD 30 patch 0   methocarbamol (ROBAXIN) 500 MG tablet Take 2 tablets (1,000 mg total) by mouth 3 (three)  times daily. 90 tablet 0   nicotine (NICODERM CQ - DOSED IN MG/24 HOURS) 21 mg/24hr patch 21 mg patch daily for  One week then 14 mg daily for three weeks then 7 mg patch daily for three weeks and stop 28 patch 0   Oxycodone HCl 10 MG TABS Take 1 tablet (10 mg total) by mouth every 4 (four) hours as needed. 30 tablet 0   polyethylene glycol (MIRALAX / GLYCOLAX) 17 g packet Take 17 g by mouth daily. 14 each 0   No current facility-administered medications for this visit.   Facility-Administered Medications Ordered in Other Visits  Medication Dose Route Frequency Provider Last Rate Last Admin   acetaminophen (TYLENOL) tablet 650 mg  650 mg Oral Q6H Violeta Gelinas, MD   650 mg at 04/19/19 1207   HYDROmorphone (DILAUDID) injection 1 mg  1 mg Intravenous Q2H PRN Violeta Gelinas, MD   1 mg at 04/15/19 2255   metoprolol tartrate (LOPRESSOR) injection 5 mg  5 mg Intravenous Q6H PRN Berna Bue, MD   5 mg at 04/12/19 0146    REVIEW OF SYSTEMS:  [X]  denotes positive finding, [ ]  denotes negative finding Cardiac  Comments:  Chest pain or chest pressure:    Shortness of breath upon exertion:    Short of breath when lying flat:    Irregular heart rhythm:        Vascular    Pain in calf, thigh, or hip brought on by ambulation:    Pain in feet at night that wakes you up from your sleep:     Blood clot in your veins:    Leg swelling:         Pulmonary    Oxygen at home:    Productive cough:     Wheezing:         Neurologic    Sudden weakness in arms or legs:     Sudden numbness in arms or legs:     Sudden onset of difficulty speaking or slurred speech:    Temporary loss of vision in one eye:     Problems with dizziness:         Gastrointestinal    Blood in stool:     Vomited blood:         Genitourinary    Burning when urinating:     Blood in urine:        Psychiatric    Major depression:         Hematologic    Bleeding problems:    Problems  with blood clotting too  easily:        Skin    Rashes or ulcers:        Constitutional    Fever or chills:      PHYSICAL EXAM: Vitals:   05/10/19 1210  BP: (!) 158/91  Pulse: 63  Resp: 16  Temp: 98.1 F (36.7 C)  TempSrc: Temporal  SpO2: 96%  Weight: 160 lb (72.6 kg)  Height: 6\' 1"  (1.854 m)    GENERAL: The patient is a well-nourished male, in no acute distress. The vital signs are documented above. CARDIAC: There is a regular rate and rhythm.  VASCULAR:  Palpable femoral pulses bilateral groins. Palpable DP and posterior tibial pulses bilateral lower extremities. PULMONARY: There is good air exchange bilaterally without wheezing or rales. ABDOMEN: Soft and non-tender with normal pitched bowel sounds.  MUSCULOSKELETAL: There are no major deformities or cyanosis. NEUROLOGIC: No focal weakness or paresthesias are detected. SKIN: There are no ulcers or rashes noted. PSYCHIATRIC: The patient has a normal affect.  DATA:   I independently reviewed CT abdomen pelvis from 04/09/2019 and agree he has a 4.4 cm infrarenal abdominal aortic aneurysm.  Assessment/Plan:  64 year old male presents with incidental discovered 4.4 cm of infrarenal abdominal aortic aneurysm.  We discussed indications for repair including size greater than 5.5 cm unless it becomes symptomatic or there is rapid growth in the aneurysm.  At this time would favor surveillance given the aneurysm only measures 4.4 cm.  I discussed follow-up again in 1 year with an aneurysm duplex here in our clinic.  We discussed options for repair in the future including endovascular repair with a stent graft versus open repair and we would make that decision at the time of repair based on updated imaging.  All of his questions were answered and and reinforced importance of smoking cessation and good blood pressure control.   Marty Heck, MD [facility] Office: [phone]

## 2019-05-11 ENCOUNTER — Encounter (HOSPITAL_COMMUNITY): Payer: BC Managed Care – PPO | Admitting: Physical Therapy

## 2019-05-12 ENCOUNTER — Other Ambulatory Visit: Payer: Self-pay | Admitting: *Deleted

## 2019-05-12 DIAGNOSIS — I714 Abdominal aortic aneurysm, without rupture, unspecified: Secondary | ICD-10-CM

## 2019-05-16 ENCOUNTER — Ambulatory Visit (HOSPITAL_COMMUNITY): Payer: BC Managed Care – PPO | Admitting: Physical Therapy

## 2019-05-16 ENCOUNTER — Telehealth (HOSPITAL_COMMUNITY): Payer: Self-pay | Admitting: Physical Therapy

## 2019-05-16 NOTE — Telephone Encounter (Signed)
Patient called states the HEP is working and he will see you back on Monday 4/19.

## 2019-05-18 ENCOUNTER — Ambulatory Visit (HOSPITAL_COMMUNITY): Payer: BC Managed Care – PPO | Admitting: Physical Therapy

## 2019-05-23 ENCOUNTER — Other Ambulatory Visit: Payer: Self-pay

## 2019-05-23 ENCOUNTER — Ambulatory Visit (HOSPITAL_COMMUNITY): Payer: BC Managed Care – PPO | Attending: Physician Assistant | Admitting: Physical Therapy

## 2019-05-23 ENCOUNTER — Encounter (HOSPITAL_COMMUNITY): Payer: Self-pay | Admitting: Physical Therapy

## 2019-05-23 DIAGNOSIS — R293 Abnormal posture: Secondary | ICD-10-CM | POA: Diagnosis not present

## 2019-05-23 DIAGNOSIS — R2689 Other abnormalities of gait and mobility: Secondary | ICD-10-CM

## 2019-05-23 DIAGNOSIS — S2232XA Fracture of one rib, left side, initial encounter for closed fracture: Secondary | ICD-10-CM | POA: Diagnosis not present

## 2019-05-23 DIAGNOSIS — S2242XS Multiple fractures of ribs, left side, sequela: Secondary | ICD-10-CM

## 2019-05-23 NOTE — Therapy (Signed)
[facility] [address] [place], [place], [address] Phone: [phone]   Fax:  [phone]  Physical Therapy Treatment/Discharge summary  Patient Details  Name: Dale Perez MRN: [id number] Date of Birth: Jul 05, 1955 Referring Provider (PT): Lauraine Rinne PA-C   Encounter Date: 05/23/2019  PHYSICAL THERAPY DISCHARGE SUMMARY  Visits from Start of Care: 3  Current functional level related to goals / functional outcomes: See below   Remaining deficits: See below   Education / Equipment: See below  Plan: Patient agrees to discharge.  Patient goals were not met. Patient is being discharged due to lack of progress.  ?????       PT End of Session - 05/23/19 1606    Visit Number  3    Number of Visits  12    Date for PT Re-Evaluation  06/08/19    Authorization Type  BCBS COMM PPO (no v.l. no auth, codes 54656, 81275, [id number] not covered)    Progress Note Due on Visit  10    PT Start Time  1517    PT Stop Time  1545    PT Time Calculation (min)  28 min    Activity Tolerance  Patient limited by pain    Behavior During Therapy  Restless;Anxious;Flat affect       History reviewed. No pertinent past medical history.  Past Surgical History:  Procedure Laterality Date  . APPENDECTOMY    . I & D EXTREMITY Left 08/10/2015   Procedure: IRRIGATION AND DEBRIDEMENT EXTREMITY;  Surgeon: Milly Jakob, MD;  Location: [facility];  Service: Orthopedics;  Laterality: Left;  . KNEE CARTILAGE SURGERY Right 02/2019  . KNEE SURGERY    . SHOULDER ARTHROSCOPY Left 08/2018  . SHOULDER SURGERY    . TENDON REPAIR N/A 08/10/2015   Procedure: TENDON REPAIR WITH WOUND CLOSURE;  Surgeon: Milly Jakob, MD;  Location: [facility];  Service: Orthopedics;  Laterality: N/A;  . TONSILLECTOMY      There were no vitals filed for this visit.  Subjective Assessment - 05/23/19 1515    Subjective  Patient is still having stiffness. He continues to have pain  especially with coughing. He is still limited with not being able to lay in the bed. He continues to have stiffness even following the exercises. He thinks he needs an X-ray.    Pertinent History  Rib Fx, AAA, recent shoulder surgery    Limitations  Other (comment);House hold activities   breathing   Patient Stated Goals  To get back on his feet    Currently in Pain?  Yes    Pain Score  6     Pain Location  Rib cage    Pain Orientation  Left    Pain Onset  1 to 4 weeks ago         [facility] PT Assessment - 05/23/19 0001      Assessment   Medical Diagnosis  Debility/ L Rib Fractures    Referring Provider (PT)  Lauraine Rinne PA-C    Onset Date/Surgical Date  04/09/19    Next MD Visit  None    Prior Therapy  Yes, knees, shoulders; recently in hospital and CIR      Precautions   Precautions  Other (comment)   rib fractures      Restrictions   Weight Bearing Restrictions  No      Balance Screen   Has the patient fallen in the past 6 months  Yes  How many times?  1    Has the patient had a decrease in activity level because of a fear of falling?   No    Is the patient reluctant to leave their home because of a fear of falling?   No      Home Film/video editor residence    Living Arrangements  Spouse/significant other      Prior Function   Level of [place] Requirements  was on disability, currently not, was workers comp      Charity fundraiser Status  Within Functional Limits for tasks assessed      Observation/Other Assessments   Focus on Therapeutic Outcomes (FOTO)   64% limited      Posture/Postural Control   Posture/Postural Control  Postural limitations    Postural Limitations  Rounded Shoulders;Forward head    Posture Comments  slouched in chair                           PT Education - 05/23/19 1549    Education Details  educated patient about fracture healing times, needing  to perform stretches more often to notice improvement in stiffness, taking pain medication as prescribed, following up with MD for x-ray if he feels he needs too, positions that will put more strain on ribs, stiffness and muscles are likely contributing to continued symptoms, returning to physical therapy if needed, perform exercises to decrease stiffness, importance of posture.    Person(s) Educated  Patient    Methods  Explanation    Comprehension  Verbalized understanding       PT Short Term Goals - 05/23/19 1618      PT SHORT TERM GOAL #1   Title  Patient will be independent with HEP in order to improve functional outcomes.    Time  3    Period  Weeks    Status  Not Met    Target Date  05/18/19      PT SHORT TERM GOAL #2   Title  Patient will report at least 25% improvement in symptoms for improved quality of life.    Time  3    Period  Weeks    Status  Not Met    Target Date  05/18/19        PT Long Term Goals - 05/23/19 1618      PT LONG TERM GOAL #1   Title  Patient will report at least 75% improvement in symptoms for improved quality of life.    Time  6    Period  Weeks    Status  Not Met      PT LONG TERM GOAL #2   Title  Patient will improve FOTO score by at least 5 points in order to indicate improved tolerance to activity.    Time  6    Period  Weeks    Status  Not Met      PT LONG TERM GOAL #3   Title  Patient will improve thoracic mobility by at least 50% in all planes in order to complete at with increased ease.    Time  6    Period  Weeks    Status  Not Met      PT LONG TERM GOAL #4   Title  Patient pain will be no greater than 2/10 with ADL for improved ability to return  to work.    Time  6    Period  Weeks    Status  Not Met            Plan - 05/23/19 1608    Clinical Impression Statement  Patient appears irritated upon entrance for session secondary to pain he continues to experience in lower L anterior ribs. He demonstrates a hostile  demeanor when describing his discomfort and symptoms while talking about punching anyone who touches or bumps his ribs and appears agitated throughout session. He continues to have c/o severe pain and stiffness especially over anterior L lower ribs. He states he performs stretches at home but they do not make much of a difference with pain and he has pain with all movement. Patient eager to get back to ADL and tends to perform activities at home that increase symptoms. He describes his frustrations about feeling he needs an x-ray. He continues to try to extend times between pain medication dosages in order to wean himself off because he states he does not want to be reliant on them. Most of the session is spent educating patient about fracture healing times, needing to perform stretches more often to notice improvement in stiffness, taking pain medication as prescribed, following up with MD for x-ray if he feels he needs too, positions that will put more strain on ribs, stiffness and muscles are likely contributing to continued symptoms. Patient has met 0/2 short term goals and 0/4 long term goals secondary to limited improvement, limited ROM, and continued pain. Patient progress likely limited at this point due to severity of injury, noncompliance with treatment plan, and trying to be very active while at home. Patient discharged from therapy at this time.    Personal Factors and Comorbidities  Behavior Pattern;Profession;Past/Current Experience;Social Background;Time since onset of injury/illness/exacerbation;Comorbidity 2    Comorbidities  AAA, recent shoulder and knee injuries/surguries    Examination-Activity Limitations  Bathing;Bed Mobility;Bend;Caring for Others;Carry;Dressing;Hygiene/Grooming;Lift;Locomotion Level;Reach Overhead;Sit;Sleep;Squat;Stairs;Stand;Transfers    Examination-Participation Restrictions  Cleaning;Community Activity;Driving;Interpersonal Relationship;Laundry;Shop;Volunteer;Yard Work     [employer] Potential  Fair    PT Frequency  --   1x/week for 1 week, 2x/week for 5 weeks for 6 weeks overall   PT Duration  --    PT Treatment/Interventions  ADLs/Self Care Home Management;Aquatic Therapy;Cryotherapy;Electrical Stimulation;Iontophoresis '4mg'$ /ml Dexamethasone;Moist Heat;Traction;Ultrasound;Gait training;DME Instruction;Stair training;Functional mobility training;Therapeutic activities;Therapeutic exercise;Balance training;Neuromuscular re-education;Patient/family education;Manual techniques;Manual lymph drainage;Compression bandaging;Scar mobilization;Passive range of motion;Dry needling;Energy conservation;Splinting;Taping;Joint Manipulations;Spinal Manipulations    PT Next Visit Plan  n/a    PT Home Exercise Plan  thoracic rotation in seated 10x bilateral , thoracic extension over chair 10x10 second holds, shoulder flexion stretch at counter 10x10 second holds, thoracic lateral flexion 10x 10 second hold each way, doorway pec minor stretch 10x10 second holds    Consulted and Agree with Plan of Care  Patient       Patient will benefit from skilled therapeutic intervention in order to improve the following deficits and impairments:  Abnormal gait, Decreased endurance, Hypomobility, Decreased activity tolerance, Decreased mobility, Decreased range of motion, Decreased strength, Increased fascial restricitons, Increased muscle spasms, Impaired flexibility, Impaired UE functional use, Postural dysfunction, Pain  Visit Diagnosis: Other abnormalities of gait and mobility  Closed fracture of multiple ribs of left side, sequela  Abnormal posture  Closed fracture of one rib of left side, initial encounter     Problem List Patient Active Problem List   Diagnosis Date Noted  . AAA (abdominal aortic aneurysm) without rupture ([place]) 05/10/2019  . Debility  04/18/2019  . Left rib fracture 04/09/2019    4:23 PM, 05/23/19 Mearl Latin PT, DPT Physical Therapist at [facility] [place], [place], [address] Phone: [phone]   Fax:  [phone]  Name: TEJUAN GHOLSON MRN: [id number] Date of Birth: 22-Nov-1955

## 2019-05-25 ENCOUNTER — Ambulatory Visit (HOSPITAL_COMMUNITY): Payer: BC Managed Care – PPO | Admitting: Physical Therapy

## 2019-05-26 ENCOUNTER — Other Ambulatory Visit: Payer: Self-pay

## 2019-05-26 ENCOUNTER — Ambulatory Visit (HOSPITAL_COMMUNITY)
Admission: RE | Admit: 2019-05-26 | Discharge: 2019-05-26 | Disposition: A | Discharge status: Home / Self Care | Payer: BC Managed Care – PPO | Source: Ambulatory Visit | Attending: Family Medicine | Admitting: Family Medicine

## 2019-05-26 ENCOUNTER — Other Ambulatory Visit (HOSPITAL_COMMUNITY): Payer: Self-pay | Admitting: Family Medicine

## 2019-05-26 DIAGNOSIS — S2242XG Multiple fractures of ribs, left side, subsequent encounter for fracture with delayed healing: Secondary | ICD-10-CM | POA: Diagnosis not present

## 2019-05-26 DIAGNOSIS — Z6821 Body mass index (BMI) 21.0-21.9, adult: Secondary | ICD-10-CM | POA: Diagnosis not present

## 2019-05-26 DIAGNOSIS — S2242XD Multiple fractures of ribs, left side, subsequent encounter for fracture with routine healing: Secondary | ICD-10-CM | POA: Diagnosis not present

## 2019-05-26 DIAGNOSIS — Z1389 Encounter for screening for other disorder: Secondary | ICD-10-CM | POA: Diagnosis not present

## 2019-05-30 ENCOUNTER — Ambulatory Visit (HOSPITAL_COMMUNITY): Payer: BC Managed Care – PPO | Admitting: Physical Therapy

## 2019-06-01 ENCOUNTER — Ambulatory Visit (HOSPITAL_COMMUNITY): Payer: BC Managed Care – PPO | Admitting: Physical Therapy

## 2019-06-06 ENCOUNTER — Encounter (HOSPITAL_COMMUNITY): Payer: BC Managed Care – PPO | Admitting: Physical Therapy

## 2019-06-08 ENCOUNTER — Encounter (HOSPITAL_COMMUNITY): Payer: BC Managed Care – PPO | Admitting: Physical Therapy

## 2019-07-18 NOTE — Progress Notes (Signed)
Primary Care Physician:  [facility] Primary Gastroenterologist:  Dr. Gala Romney  Chief Complaint  Patient presents with  . Abdominal Pain    Pt says he is due for TCS, had diarrhea and abd pain after surgery    HPI:   Dale Perez is a 64 y.o. male presenting today initially for abdominal pain and to discuss scheduling colonoscopy.    Last colonoscopy October 2010 with normal rectum, sigmoid diverticula, otherwise normal colon.  Recommended repeat in 10 years. We have not seen patient since colonoscopy.   Admitted 3/6-3/15/21 after a fall from a ladder with concussion, left rib fractures, hemothorax not requiring chest tube, and incidentally noted to AAA at 4.4cm.   Today: Patient states he had been having abdominal pain and diarrhea after his fall in March 2021.  This has resolved. Thinks a lot of it had to do with the medications he was on. When he stopped taking the medications, he developed left lower quadrant abdominal pain with meals and associated postprandial diarrhea with urgency.  Symptoms lasted for about 1 week.  He kept his appointment today as he is due for his colonoscopy. Currently have a soft/formed BM daily.No blood in the stool or melena. No unintentional weight loss. No upper GI concerns.   No trouble with prior colonoscopy.   Overall feeling well but continues with some soreness on his left side from 10 rib fractures.   Past Medical History:  Diagnosis Date  . AAA (abdominal aortic aneurysm) ([place]) 04/2019   4.4cm    Past Surgical History:  Procedure Laterality Date  . APPENDECTOMY    . COLONOSCOPY  11/2008   [place]; sigmoid diverticula, otherwise normal exam. Repeat in 2020.   . I & D EXTREMITY Left 08/10/2015   Procedure: IRRIGATION AND DEBRIDEMENT EXTREMITY;  Surgeon: Milly Jakob, MD;  Location: [facility];  Service: Orthopedics;  Laterality: Left;  . KNEE CARTILAGE SURGERY Right 02/2019  . KNEE SURGERY    . SHOULDER ARTHROSCOPY Left  08/2018  . SHOULDER SURGERY    . TENDON REPAIR N/A 08/10/2015   Procedure: TENDON REPAIR WITH WOUND CLOSURE;  Surgeon: Milly Jakob, MD;  Location: [facility];  Service: Orthopedics;  Laterality: N/A;  . TONSILLECTOMY      Current Outpatient Medications  Medication Sig Dispense Refill  . acetaminophen (TYLENOL) 325 MG tablet Take 1-2 tablets (325-650 mg total) by mouth every 4 (four) hours as needed for mild pain. (Patient taking differently: Take 325-650 mg by mouth as needed for mild pain. )    . naproxen sodium (ALEVE) 220 MG tablet Take 220 mg by mouth. Takes 2 tablets daily.     No current facility-administered medications for this visit.   Facility-Administered Medications Ordered in Other Visits  Medication Dose Route Frequency Provider Last Rate Last Admin  . acetaminophen (TYLENOL) tablet 650 mg  650 mg Oral Q6H Georganna Skeans, MD   650 mg at 04/19/19 1207  . HYDROmorphone (DILAUDID) injection 1 mg  1 mg Intravenous Q2H PRN Georganna Skeans, MD   1 mg at 04/15/19 2255  . metoprolol tartrate (LOPRESSOR) injection 5 mg  5 mg Intravenous Q6H PRN Romana Juniper A, MD   5 mg at 04/12/19 0146    Allergies as of 07/20/2019 - Review Complete 07/20/2019  Allergen Reaction Noted  . Aspirin  04/09/2019  . Penicillins  04/09/2019  . Quinolones  04/19/2019  . Aspirin Hives, Rash, and Other (See Comments) 08/09/2015    Family History  Problem Relation Age of Onset  . Heart attack Father   . Colon cancer Neg Hx   . Colon polyps Neg Hx     Social History   Socioeconomic History  . Marital status: Married    Spouse name: Not on file  . Number of children: Not on file  . Years of education: Not on file  . Highest education level: Not on file  Occupational History  . Not on file  Tobacco Use  . Smoking status: Current Every Day Smoker    Packs/day: 1.00    Years: 40.00    Pack years: 40.00    Types: Cigarettes  . Smokeless tobacco: Never Used  Vaping Use  . Vaping Use: Never  used  Substance and Sexual Activity  . Alcohol use: Yes    Alcohol/week: 1.0 standard drink    Types: 1 Cans of beer per week    Comment: 1-2 drinks a month.   . Drug use: Never  . Sexual activity: Yes  Other Topics Concern  . Not on file  Social History Narrative   ** Merged History Encounter **       Social Determinants of Health   Financial Resource Strain:   . Difficulty of Paying Living Expenses:   Food Insecurity:   . Worried About Programme researcher, broadcasting/film/video in the Last Year:   . Barista in the Last Year:   Transportation Needs:   . Freight forwarder (Medical):   Marland Kitchen Lack of Transportation (Non-Medical):   Physical Activity:   . Days of Exercise per Week:   . Minutes of Exercise per Session:   Stress:   . Feeling of Stress :   Social Connections:   . Frequency of Communication with Friends and Family:   . Frequency of Social Gatherings with Friends and Family:   . Attends Religious Services:   . Active Member of Clubs or Organizations:   . Attends Banker Meetings:   Marland Kitchen Marital Status:   Intimate Partner Violence:   . Fear of Current or Ex-Partner:   . Emotionally Abused:   Marland Kitchen Physically Abused:   . Sexually Abused:     Review of Systems: Gen: Denies any fever, chills, lightheadedness, dizziness, presyncope, syncope CV: Denies chest pain or heart palpitations Resp: Denies shortness of breath or cough GI: See HPI GU : Denies urinary burning, urinary frequency, urinary hesitancy MS: Admits to pain in his knees, shoulders and left rib pain.  Derm: Denies rash Psych: Denies depression or anxiety Heme: See HPI  Physical Exam: BP (!) 161/94   Pulse 63   Temp (!) 96.9 F (36.1 C) (Temporal)   Ht 6\' 1"  (1.854 m)   Wt 163 lb 6.4 oz (74.1 kg)   BMI 21.56 kg/m  General:   Alert and oriented. Pleasant and cooperative. Well-nourished and well-developed.  Head:  Normocephalic and atraumatic. Eyes:  Without icterus, sclera clear and conjunctiva  pink.  Ears:  Normal auditory acuity. Lungs:  Clear to auscultation bilaterally. No wheezes, rales, or rhonchi. No distress.  Heart:  S1, S2 present without murmurs appreciated.  Abdomen:  +BS, soft, non-tender and non-distended. No HSM noted. No guarding or rebound. No masses appreciated.  Rectal:  Deferred  Msk:  Symmetrical without gross deformities. Normal posture. Extremities:  Without edema. Neurologic:  Alert and  oriented x4;  grossly normal neurologically. Skin:  Intact without significant lesions or rashes. Psych:  Normal mood and affect.

## 2019-07-20 ENCOUNTER — Other Ambulatory Visit: Payer: Self-pay

## 2019-07-20 ENCOUNTER — Ambulatory Visit (INDEPENDENT_AMBULATORY_CARE_PROVIDER_SITE_OTHER): Payer: BC Managed Care – PPO | Admitting: Gastroenterology

## 2019-07-20 ENCOUNTER — Encounter: Payer: Self-pay | Admitting: Gastroenterology

## 2019-07-20 DIAGNOSIS — Z1211 Encounter for screening for malignant neoplasm of colon: Secondary | ICD-10-CM | POA: Diagnosis not present

## 2019-07-20 NOTE — Patient Instructions (Signed)
We we will get you scheduled for your colonoscopy in the near future with Dr. Jena Gauss.   We will plan to see back after your colonoscopy as Dr. Jena Gauss recommends.  Do not hesitate to call if you have questions or concerns.  Ermalinda Memos, PA-C [facility]

## 2019-07-21 ENCOUNTER — Encounter: Payer: Self-pay | Admitting: Gastroenterology

## 2019-07-21 NOTE — Assessment & Plan Note (Signed)
64 year old male presenting today to schedule colonoscopy for colon cancer screening.  Last colonoscopy in October 2010 with normal rectum, sigmoid diverticula, otherwise normal colon with recommendations to repeat in 10 years.  He is currently without any significant upper or lower GI symptoms.  No alarm symptoms.  No family history of colon cancer or colon polyps.  Proceed with colonoscopy with Dr. Jena Gauss in the near future. The risks, benefits, and alternatives have been discussed in detail with patient. They have stated understanding and desire to proceed.  Follow-up as recommended at the time of colonoscopy.

## 2019-07-22 NOTE — Progress Notes (Signed)
Cc'ed to pcp °

## 2019-08-12 ENCOUNTER — Telehealth: Payer: Self-pay | Admitting: *Deleted

## 2019-08-12 MED ORDER — NA SULFATE-K SULFATE-MG SULF 17.5-3.13-1.6 GM/177ML PO SOLN: 1.0000 | Freq: Once | ORAL | 0 refills | Status: AC

## 2019-08-12 NOTE — Telephone Encounter (Signed)
Called pt. Scheduled TCS with RMR 8/6 at 9:45am. COVID test scheduled for 09/08/19 at 8:00am. Aware of location. Advised will mail prep instructions. Confirmed address. Confirmed pharmacy.

## 2019-09-08 ENCOUNTER — Other Ambulatory Visit (HOSPITAL_COMMUNITY)
Admission: RE | Admit: 2019-09-08 | Discharge: 2019-09-08 | Disposition: A | Discharge status: Home / Self Care | Payer: BC Managed Care – PPO | Source: Ambulatory Visit | Attending: Internal Medicine | Admitting: Internal Medicine

## 2019-09-08 ENCOUNTER — Other Ambulatory Visit: Payer: Self-pay

## 2019-09-08 DIAGNOSIS — Z01812 Encounter for preprocedural laboratory examination: Secondary | ICD-10-CM | POA: Insufficient documentation

## 2019-09-08 DIAGNOSIS — Z20822 Contact with and (suspected) exposure to covid-19: Secondary | ICD-10-CM | POA: Diagnosis not present

## 2019-09-08 LAB — SARS CORONAVIRUS 2 (TAT 6-24 HRS): SARS Coronavirus 2: NEGATIVE

## 2019-09-09 ENCOUNTER — Ambulatory Visit (HOSPITAL_COMMUNITY)
Admission: RE | Admit: 2019-09-09 | Discharge: 2019-09-09 | Disposition: A | Discharge status: Home / Self Care | Payer: BC Managed Care – PPO | Attending: Internal Medicine | Admitting: Internal Medicine

## 2019-09-09 ENCOUNTER — Other Ambulatory Visit: Payer: Self-pay

## 2019-09-09 ENCOUNTER — Encounter (HOSPITAL_COMMUNITY)
Admission: RE | Disposition: A | Discharge status: Home / Self Care | Payer: Self-pay | Source: Home / Self Care | Attending: Internal Medicine

## 2019-09-09 ENCOUNTER — Encounter (HOSPITAL_COMMUNITY): Payer: Self-pay | Admitting: Internal Medicine

## 2019-09-09 DIAGNOSIS — Z886 Allergy status to analgesic agent status: Secondary | ICD-10-CM | POA: Diagnosis not present

## 2019-09-09 DIAGNOSIS — K5730 Diverticulosis of large intestine without perforation or abscess without bleeding: Secondary | ICD-10-CM | POA: Insufficient documentation

## 2019-09-09 DIAGNOSIS — Z881 Allergy status to other antibiotic agents status: Secondary | ICD-10-CM | POA: Insufficient documentation

## 2019-09-09 DIAGNOSIS — Z1211 Encounter for screening for malignant neoplasm of colon: Secondary | ICD-10-CM | POA: Diagnosis not present

## 2019-09-09 DIAGNOSIS — Z7982 Long term (current) use of aspirin: Secondary | ICD-10-CM | POA: Diagnosis not present

## 2019-09-09 DIAGNOSIS — I714 Abdominal aortic aneurysm, without rupture: Secondary | ICD-10-CM | POA: Insufficient documentation

## 2019-09-09 DIAGNOSIS — F17210 Nicotine dependence, cigarettes, uncomplicated: Secondary | ICD-10-CM | POA: Insufficient documentation

## 2019-09-09 DIAGNOSIS — Z8249 Family history of ischemic heart disease and other diseases of the circulatory system: Secondary | ICD-10-CM | POA: Diagnosis not present

## 2019-09-09 DIAGNOSIS — Z880 Allergy status to penicillin: Secondary | ICD-10-CM | POA: Diagnosis not present

## 2019-09-09 HISTORY — PX: COLONOSCOPY: SHX5424

## 2019-09-09 SURGERY — COLONOSCOPY
Anesthesia: Moderate Sedation

## 2019-09-09 MED ORDER — MEPERIDINE HCL 100 MG/ML IJ SOLN
INTRAMUSCULAR | Status: DC | PRN
  Administered 2019-09-09 (×2): 25 mg via INTRAVENOUS

## 2019-09-09 MED ORDER — SODIUM CHLORIDE 0.9 % IV SOLN: INTRAVENOUS | Status: DC

## 2019-09-09 MED ORDER — MIDAZOLAM HCL 5 MG/5ML IJ SOLN
INTRAMUSCULAR | Status: DC | PRN
  Administered 2019-09-09: 2 mg via INTRAVENOUS
  Administered 2019-09-09: 1 mg via INTRAVENOUS
  Administered 2019-09-09: 2 mg via INTRAVENOUS

## 2019-09-09 MED ORDER — ONDANSETRON HCL 4 MG/2ML IJ SOLN
INTRAMUSCULAR | Status: AC
  Filled 2019-09-09: qty 2

## 2019-09-09 MED ORDER — MIDAZOLAM HCL 5 MG/5ML IJ SOLN
INTRAMUSCULAR | Status: AC
  Filled 2019-09-09: qty 10

## 2019-09-09 MED ORDER — STERILE WATER FOR IRRIGATION IR SOLN
Status: DC | PRN
  Administered 2019-09-09: 1.5 mL

## 2019-09-09 MED ORDER — MEPERIDINE HCL 50 MG/ML IJ SOLN
INTRAMUSCULAR | Status: AC
  Filled 2019-09-09: qty 1

## 2019-09-09 MED ORDER — ONDANSETRON HCL 4 MG/2ML IJ SOLN
INTRAMUSCULAR | Status: DC | PRN
  Administered 2019-09-09: 4 mg via INTRAVENOUS

## 2019-09-09 NOTE — H&P (Signed)
@LOGO @   [facility] Care Physician:  [facility] Primary Gastroenterologist:  Dr. Nathen May  Pre-Procedure History & Physical: HPI:  Dale Perez is a 64 y.o. male is here for a screening colonoscopy.  Negative colonoscopy 2010 (diverticulosis).  Past Medical History:  Diagnosis Date  . AAA (abdominal aortic aneurysm) (HCC) 04/2019   4.4cm    Past Surgical History:  Procedure Laterality Date  . APPENDECTOMY    . COLONOSCOPY  11/2008   Quanna Wittke; sigmoid diverticula, otherwise normal exam. Repeat in 2020.   . I & D EXTREMITY Left 08/10/2015   Procedure: IRRIGATION AND DEBRIDEMENT EXTREMITY;  Surgeon: 10/11/2015, MD;  Location: [facility] OR;  Service: Orthopedics;  Laterality: Left;  . KNEE CARTILAGE SURGERY Right 02/2019  . KNEE SURGERY    . SHOULDER ARTHROSCOPY Left 08/2018  . SHOULDER SURGERY    . TENDON REPAIR N/A 08/10/2015   Procedure: TENDON REPAIR WITH WOUND CLOSURE;  Surgeon: 10/11/2015, MD;  Location: [facility] OR;  Service: Orthopedics;  Laterality: N/A;  . TONSILLECTOMY      Prior to Admission medications   Medication Sig Start Date End Date Taking? Authorizing Provider  naproxen sodium (ALEVE) 220 MG tablet Take 440 mg by mouth daily.    Yes [provider]  acetaminophen (TYLENOL) 325 MG tablet Take 1-2 tablets (325-650 mg total) by mouth every 4 (four) hours as needed for mild pain. Patient not taking: Reported on 08/29/2019 04/21/19   04/23/19, PA-C    Allergies as of 08/12/2019 - Review Complete 07/20/2019  Allergen Reaction Noted  . Aspirin  04/09/2019  . Penicillins  04/09/2019  . Quinolones  04/19/2019  . Aspirin Hives, Rash, and Other (See Comments) 08/09/2015    Family History  Problem Relation Age of Onset  . Heart attack Father   . Colon cancer Neg Hx   . Colon polyps Neg Hx     Social History   Socioeconomic History  . Marital status: Married    Spouse name: Not on file  . Number of children: Not on file  . Years  of education: Not on file  . Highest education level: Not on file  Occupational History  . Not on file  Tobacco Use  . Smoking status: Current Every Day Smoker    Packs/day: 1.00    Years: 40.00    Pack years: 40.00    Types: Cigarettes  . Smokeless tobacco: Never Used  Vaping Use  . Vaping Use: Never used  Substance and Sexual Activity  . Alcohol use: Yes    Alcohol/week: 1.0 standard drink    Types: 1 Cans of beer per week    Comment: 1-2 drinks a month.   . Drug use: Never  . Sexual activity: Yes  Other Topics Concern  . Not on file  Social History Narrative   ** Merged History Encounter **       Social Determinants of Health   Financial Resource Strain:   . Difficulty of Paying Living Expenses:   Food Insecurity:   . Worried About 10/10/2015 in the Last Year:   . Programme researcher, broadcasting/film/video in the Last Year:   Transportation Needs:   . Barista (Medical):   Freight forwarder Lack of Transportation (Non-Medical):   Physical Activity:   . Days of Exercise per Week:   . Minutes of Exercise per Session:   Stress:   . Feeling of Stress :   Social Connections:   .  Frequency of Communication with Friends and Family:   . Frequency of Social Gatherings with Friends and Family:   . Attends Religious Services:   . Active Member of Clubs or Organizations:   . Attends Banker Meetings:   Marland Kitchen Marital Status:   Intimate Partner Violence:   . Fear of Current or Ex-Partner:   . Emotionally Abused:   Marland Kitchen Physically Abused:   . Sexually Abused:     Review of Systems: See HPI, otherwise negative ROS  Physical Exam: BP (!) 141/93   Pulse 76   Temp 98.4 F (36.9 C) (Oral)   Resp 18   Ht 6\' 1"  (1.854 m)   Wt 73.9 kg   SpO2 98%   BMI 21.51 kg/m  General:   Alert,  Well-developed, well-nourished, pleasant and cooperative in NAD Lungs:  Clear throughout to auscultation.   No wheezes, crackles, or rhonchi. No acute distress. Heart:  Regular rate and rhythm; no  murmurs, clicks, rubs,  or gallops. Abdomen:  Soft, nontender and nondistended. No masses, hepatosplenomegaly or hernias noted. Normal bowel sounds, without guarding, and without rebound.    Impression/Plan: is now here to undergo a screening colonoscopy.  Average risk screening examination.  Risks, benefits, limitations, imponderables and alternatives regarding colonoscopy have been reviewed with the patient. Questions have been answered. All parties agreeable.     Notice:  This dictation was prepared with Dragon dictation along with smaller phrase technology. Any transcriptional errors that result from this process are unintentional and may not be corrected upon review.

## 2019-09-09 NOTE — Op Note (Signed)
[facility] Patient Name: Dale Perez Procedure Date: 09/09/2019 9:12 AM MRN: [id number] Date of Birth: May 22, 1955 Attending MD: Gennette Pac , MD CSN: [id number] Age: 64 Admit Type: Outpatient Procedure:                Colonoscopy Indications:              Screening for colorectal malignant neoplasm Providers:                Gennette Pac, MD, Criselda Peaches. Mathis Fare RN, RN,                            Crystal Page, Edythe Clarity, Technician Referring MD:              Medicines:                Midazolam 5 mg IV, Meperidine 50 mg IV Complications:            No immediate complications. Estimated Blood Loss:     Estimated blood loss: none. Procedure:                Pre-Anesthesia Assessment:                           - Prior to the procedure, a History and Physical                            was performed, and patient medications and                            allergies were reviewed. The patient's tolerance of                            previous anesthesia was also reviewed. The risks                            and benefits of the procedure and the sedation                            options and risks were discussed with the patient.                            All questions were answered, and informed consent                            was obtained. Prior Anticoagulants: The patient has                            taken no previous anticoagulant or antiplatelet                            agents. ASA Grade Assessment: II - A patient with                            mild systemic disease. After reviewing the risks  and benefits, the patient was deemed in                            satisfactory condition to undergo the procedure.                           After obtaining informed consent, the colonoscope                            was passed under direct vision. Throughout the                            procedure, the patient's blood pressure, pulse, and                             oxygen saturations were monitored continuously. The                            CF-HQ190L ([id number]) scope was introduced through                            the anus and advanced to the the cecum, identified                            by appendiceal orifice and ileocecal valve. The                            colonoscopy was performed without difficulty. The                            patient tolerated the procedure well. The quality                            of the bowel preparation was adequate. Scope In: 9:40:43 AM Scope Out: 9:51:38 AM Scope Withdrawal Time: 0 hours 6 minutes 21 seconds  Total Procedure Duration: 0 hours 10 minutes 55 seconds  Findings:      The perianal and digital rectal examinations were normal.      Many medium-mouthed diverticula were found in the sigmoid colon and       descending colon.      The exam was otherwise without abnormality on direct and retroflexion       views. Impression:               - Diverticulosis in the sigmoid colon and in the                            descending colon.                           - The examination was otherwise normal on direct                            and retroflexion views.                           -  No specimens collected. Moderate Sedation:      Moderate (conscious) sedation was administered by the endoscopy nurse       and supervised by the endoscopist. The following parameters were       monitored: oxygen saturation, heart rate, blood pressure, respiratory       rate, EKG, adequacy of pulmonary ventilation, and response to care.       Total physician intraservice time was 21 minutes. Recommendation:           - Patient has a contact number available for                            emergencies. The signs and symptoms of potential                            delayed complications were discussed with the                            patient. Return to normal activities tomorrow.                             Written discharge instructions were provided to the                            patient.                           - Resume previous diet.                           - Continue present medications.                           - Repeat colonoscopy in 10 years for screening                            purposes.                           - Return to GI office (date not yet determined). Procedure Code(s):        --- Professional ---                           [id number], Colonoscopy, flexible; diagnostic, including                            collection of specimen(s) by brushing or washing,                            when performed (separate procedure)                           G0500, Moderate sedation services provided by the                            same physician or other qualified health care  professional performing a gastrointestinal                            endoscopic service that sedation supports,                            requiring the presence of an independent trained                            observer to assist in the monitoring of the                            patient's level of consciousness and physiological                            status; initial 15 minutes of intra-service time;                            patient age 49 years or older (additional time may                            be reported with 25852, as appropriate) Diagnosis Code(s):        --- Professional ---                           Z12.11, Encounter for screening for malignant                            neoplasm of colon                           K57.30, Diverticulosis of large intestine without                            perforation or abscess without bleeding CPT copyright 2019 American Medical Association. All rights reserved. The codes documented in this report are preliminary and upon coder review may  be revised to meet current compliance requirements. [name],  MD Gennette Pac, MD 09/09/2019 10:06:17 AM This report has been signed electronically. Number of Addenda: 0

## 2019-09-09 NOTE — Discharge Instructions (Signed)
Colonoscopy Discharge Instructions  Read the instructions outlined below and refer to this sheet in the next few weeks. These discharge instructions provide you with general information on caring for yourself after you leave the hospital. Your doctor may also give you specific instructions. While your treatment has been planned according to the most current medical practices available, unavoidable complications occasionally occur. If you have any problems or questions after discharge, call Dr. Jena Gauss at [phone]. ACTIVITY  You may resume your regular activity, but move at a slower pace for the next 24 hours.   Take frequent rest periods for the next 24 hours.   Walking will help get rid of the air and reduce the bloated feeling in your belly (abdomen).   No driving for 24 hours (because of the medicine (anesthesia) used during the test).    Do not sign any important legal documents or operate any machinery for 24 hours (because of the anesthesia used during the test).  NUTRITION  Drink plenty of fluids.   You may resume your normal diet as instructed by your doctor.   Begin with a light meal and progress to your normal diet. Heavy or fried foods are harder to digest and may make you feel sick to your stomach (nauseated).   Avoid alcoholic beverages for 24 hours or as instructed.  MEDICATIONS  You may resume your normal medications unless your doctor tells you otherwise.  WHAT YOU CAN EXPECT TODAY  Some feelings of bloating in the abdomen.   Passage of more gas than usual.   Spotting of blood in your stool or on the toilet paper.  IF YOU HAD POLYPS REMOVED DURING THE COLONOSCOPY:  No aspirin products for 7 days or as instructed.   No alcohol for 7 days or as instructed.   Eat a soft diet for the next 24 hours.  FINDING OUT THE RESULTS OF YOUR TEST Not all test results are available during your visit. If your test results are not back during the visit, make an appointment  with your caregiver to find out the results. Do not assume everything is normal if you have not heard from your caregiver or the medical facility. It is important for you to follow up on all of your test results.  SEEK IMMEDIATE MEDICAL ATTENTION IF:  You have more than a spotting of blood in your stool.   Your belly is swollen (abdominal distention).   You are nauseated or vomiting.   You have a temperature over 101.   You have abdominal pain or discomfort that is severe or gets worse throughout the day.    Diverticulosis information provided  No polyps found today.  Recommend 1 more screening colonoscopy in 10 years  At patient request, I called Dale Perez at [phone]  -reviewed results   Diverticulosis  Diverticulosis is a condition that develops when small pouches (diverticula) form in the wall of the large intestine (colon). The colon is where water is absorbed and stool (feces) is formed. The pouches form when the inside layer of the colon pushes through weak spots in the outer layers of the colon. You may have a few pouches or many of them. The pouches usually do not cause problems unless they become inflamed or infected. When this happens, the condition is called diverticulitis. What are the causes? The cause of this condition is not known. What increases the risk? The following factors may make you more likely to develop this condition:  Being older than age  33. Your risk for this condition increases with age. Diverticulosis is rare among people younger than age 12. By age 1, many people have it.  Eating a low-fiber diet.  Having frequent constipation.  Being overweight.  Not getting enough exercise.  Smoking.  Taking over-the-counter pain medicines, like aspirin and ibuprofen.  Having a family history of diverticulosis. What are the signs or symptoms? In most people, there are no symptoms of this condition. If you do have symptoms, they may  include:  Bloating.  Cramps in the abdomen.  Constipation or diarrhea.  Pain in the lower left side of the abdomen. How is this diagnosed? Because diverticulosis usually has no symptoms, it is most often diagnosed during an exam for other colon problems. The condition may be diagnosed by:  Using a flexible scope to examine the colon (colonoscopy).  Taking an X-ray of the colon after dye has been put into the colon (barium enema).  Having a CT scan. How is this treated? You may not need treatment for this condition. Your health care provider may recommend treatment to prevent problems. You may need treatment if you have symptoms or if you previously had diverticulitis. Treatment may include:  Eating a high-fiber diet.  Taking a fiber supplement.  Taking a live bacteria supplement (probiotic).  Taking medicine to relax your colon. Follow these instructions at home: Medicines  Take over-the-counter and prescription medicines only as told by your health care provider.  If told by your health care provider, take a fiber supplement or probiotic. Constipation prevention Your condition may cause constipation. To prevent or treat constipation, you may need to:  Drink enough fluid to keep your urine pale yellow.  Take over-the-counter or prescription medicines.  Eat foods that are high in fiber, such as beans, whole grains, and fresh fruits and vegetables.  Limit foods that are high in fat and processed sugars, such as fried or sweet foods.  General instructions  Try not to strain when you have a bowel movement.  Keep all follow-up visits as told by your health care provider. This is important. Contact a health care provider if you:  Have pain in your abdomen.  Have bloating.  Have cramps.  Have not had a bowel movement in 3 days. Get help right away if:  Your pain gets worse.  Your bloating becomes very bad.  You have a fever or chills, and your symptoms  suddenly get worse.  You vomit.  You have bowel movements that are bloody or black.  You have bleeding from your rectum. Summary  Diverticulosis is a condition that develops when small pouches (diverticula) form in the wall of the large intestine (colon).  You may have a few pouches or many of them.  This condition is most often diagnosed during an exam for other colon problems.  Treatment may include increasing the fiber in your diet, taking supplements, or taking medicines. This information is not intended to replace advice given to you by your health care provider. Make sure you discuss any questions you have with your health care provider. Document Revised: 08/19/2018 Document Reviewed: 08/19/2018 Elsevier Patient Education  [place].

## 2019-09-14 ENCOUNTER — Encounter (HOSPITAL_COMMUNITY): Payer: Self-pay | Admitting: Internal Medicine

## 2019-10-25 DIAGNOSIS — H906 Mixed conductive and sensorineural hearing loss, bilateral: Secondary | ICD-10-CM | POA: Diagnosis not present

## 2019-10-25 DIAGNOSIS — H938X3 Other specified disorders of ear, bilateral: Secondary | ICD-10-CM | POA: Diagnosis not present

## 2019-10-25 DIAGNOSIS — F17210 Nicotine dependence, cigarettes, uncomplicated: Secondary | ICD-10-CM | POA: Diagnosis not present

## 2019-10-25 DIAGNOSIS — H7102 Cholesteatoma of attic, left ear: Secondary | ICD-10-CM | POA: Diagnosis not present

## 2019-11-18 ENCOUNTER — Other Ambulatory Visit (HOSPITAL_COMMUNITY): Payer: Self-pay | Admitting: Otolaryngology

## 2019-11-18 ENCOUNTER — Other Ambulatory Visit: Payer: Self-pay | Admitting: Otolaryngology

## 2019-11-18 DIAGNOSIS — H906 Mixed conductive and sensorineural hearing loss, bilateral: Secondary | ICD-10-CM

## 2019-11-18 DIAGNOSIS — H6123 Impacted cerumen, bilateral: Secondary | ICD-10-CM

## 2019-11-18 DIAGNOSIS — H7102 Cholesteatoma of attic, left ear: Secondary | ICD-10-CM

## 2019-11-30 ENCOUNTER — Other Ambulatory Visit: Payer: Self-pay

## 2019-11-30 ENCOUNTER — Ambulatory Visit (HOSPITAL_COMMUNITY)
Admission: RE | Admit: 2019-11-30 | Discharge: 2019-11-30 | Disposition: A | Discharge status: Home / Self Care | Payer: BC Managed Care – PPO | Source: Ambulatory Visit | Attending: Otolaryngology | Admitting: Otolaryngology

## 2019-11-30 DIAGNOSIS — H7102 Cholesteatoma of attic, left ear: Secondary | ICD-10-CM | POA: Diagnosis not present

## 2019-11-30 DIAGNOSIS — H6123 Impacted cerumen, bilateral: Secondary | ICD-10-CM | POA: Insufficient documentation

## 2019-11-30 DIAGNOSIS — H903 Sensorineural hearing loss, bilateral: Secondary | ICD-10-CM | POA: Diagnosis not present

## 2019-11-30 DIAGNOSIS — H906 Mixed conductive and sensorineural hearing loss, bilateral: Secondary | ICD-10-CM | POA: Insufficient documentation

## 2019-11-30 LAB — POCT I-STAT CREATININE: Creatinine, Ser: 0.7 mg/dL (ref 0.61–1.24)

## 2019-11-30 MED ORDER — IOHEXOL 300 MG/ML  SOLN
75.0000 mL | Freq: Once | INTRAMUSCULAR | Status: AC | PRN
  Administered 2019-11-30: 75 mL via INTRAVENOUS

## 2019-12-06 DIAGNOSIS — H7102 Cholesteatoma of attic, left ear: Secondary | ICD-10-CM | POA: Diagnosis not present

## 2019-12-06 DIAGNOSIS — H906 Mixed conductive and sensorineural hearing loss, bilateral: Secondary | ICD-10-CM | POA: Diagnosis not present

## 2020-01-03 DIAGNOSIS — Z01818 Encounter for other preprocedural examination: Secondary | ICD-10-CM | POA: Diagnosis not present

## 2020-01-06 DIAGNOSIS — H7192 Unspecified cholesteatoma, left ear: Secondary | ICD-10-CM | POA: Diagnosis not present

## 2020-01-06 DIAGNOSIS — H7122 Cholesteatoma of mastoid, left ear: Secondary | ICD-10-CM | POA: Diagnosis not present

## 2020-01-06 DIAGNOSIS — H7102 Cholesteatoma of attic, left ear: Secondary | ICD-10-CM | POA: Diagnosis not present

## 2020-05-15 ENCOUNTER — Encounter: Payer: Self-pay | Admitting: Vascular Surgery

## 2020-05-15 ENCOUNTER — Other Ambulatory Visit: Payer: Self-pay

## 2020-05-15 ENCOUNTER — Ambulatory Visit (HOSPITAL_COMMUNITY)
Admission: RE | Admit: 2020-05-15 | Discharge: 2020-05-15 | Disposition: A | Discharge status: Home / Self Care | Payer: BC Managed Care – PPO | Source: Ambulatory Visit | Attending: Vascular Surgery | Admitting: Vascular Surgery

## 2020-05-15 ENCOUNTER — Ambulatory Visit (INDEPENDENT_AMBULATORY_CARE_PROVIDER_SITE_OTHER): Payer: BC Managed Care – PPO | Admitting: Vascular Surgery

## 2020-05-15 VITALS — BP 154/90 | HR 61 | Temp 98.2°F | Resp 16 | Ht 73.0 in | Wt 163.0 lb

## 2020-05-15 DIAGNOSIS — I714 Abdominal aortic aneurysm, without rupture, unspecified: Secondary | ICD-10-CM

## 2020-05-15 NOTE — Progress Notes (Signed)
Patient name: Dale Perez MRN: [id number] DOB: 24-Nov-1955 Sex: male  REASON FOR CONSULT: 1 year follow-up for 4.4 cm abdominal aortic aneurysm  HPI: Dale Perez is a 65 y.o. male, with history of tobacco abuse who presents for 1 year follow-up of 4.4 cm abdominal aortic aneurysm discovered incidentally after fall.  Patient fell 17 feet out of a tree and underwent trauma work-up.  Ultimately he sustained number of rib fractures and was found to have a 4.4 cm abdominal aortic aneurysm on a CT from 04/11/2019.  He denies any prior knowledge of his aneurysm.  He has had only an appendectomy for abdominal surgery.    On follow-up today he is smoking a pack a day.  He reports no new abdominal or back pain.  No other changes to his health since he was last seen.  Past Medical History:  Diagnosis Date  . AAA (abdominal aortic aneurysm) (HCC) 04/2019   4.4cm    Past Surgical History:  Procedure Laterality Date  . APPENDECTOMY    . COLONOSCOPY  11/2008   Rourk; sigmoid diverticula, otherwise normal exam. Repeat in 2020.   Marland Kitchen COLONOSCOPY N/A 09/09/2019   Procedure: COLONOSCOPY;  Surgeon: Corbin Ade, MD;  [facility];  Service: Endoscopy;  Laterality: N/A;  9:45am  . I & D EXTREMITY Left 08/10/2015   Procedure: IRRIGATION AND DEBRIDEMENT EXTREMITY;  Surgeon: Mack Hook, MD;  Location: [facility] OR;  Service: Orthopedics;  Laterality: Left;  . KNEE CARTILAGE SURGERY Right 02/2019  . KNEE SURGERY    . SHOULDER ARTHROSCOPY Left 08/2018  . SHOULDER SURGERY    . TENDON REPAIR N/A 08/10/2015   Procedure: TENDON REPAIR WITH WOUND CLOSURE;  Surgeon: Mack Hook, MD;  Location: [facility] OR;  Service: Orthopedics;  Laterality: N/A;  . TONSILLECTOMY      Family History  Problem Relation Age of Onset  . Heart attack Father   . Colon cancer Neg Hx   . Colon polyps Neg Hx     SOCIAL HISTORY: Social History   Socioeconomic History  . Marital status: Married    Spouse name: Not on  file  . Number of children: Not on file  . Years of education: Not on file  . Highest education level: Not on file  Occupational History  . Not on file  Tobacco Use  . Smoking status: Current Every Day Smoker    Packs/day: 1.00    Years: 40.00    Pack years: 40.00    Types: Cigarettes  . Smokeless tobacco: Never Used  Vaping Use  . Vaping Use: Never used  Substance and Sexual Activity  . Alcohol use: Yes    Alcohol/week: 1.0 standard drink    Types: 1 Cans of beer per week    Comment: 1-2 drinks a month.   . Drug use: Never  . Sexual activity: Yes  Other Topics Concern  . Not on file  Social History Narrative   ** Merged History Encounter **       Social Determinants of Health   Financial Resource Strain: Not on file  Food Insecurity: Not on file  Transportation Needs: Not on file  Physical Activity: Not on file  Stress: Not on file  Social Connections: Not on file  Intimate Partner Violence: Not on file    Allergies  Allergen Reactions  . Penicillins Other (See Comments)    unknown  . Quinolones Other (See Comments)    Patient has an AAA Patient  has an AAA  . Aspirin Hives, Rash and Other (See Comments)    Childhood / able to take 81 mg Childhood / able to take 81 mg    Current Outpatient Medications  Medication Sig Dispense Refill  . naproxen sodium (ALEVE) 220 MG tablet Take 440 mg by mouth daily.     Marland Kitchen acetaminophen (TYLENOL) 325 MG tablet Take 1-2 tablets (325-650 mg total) by mouth every 4 (four) hours as needed for mild pain. (Patient not taking: No sig reported)     No current facility-administered medications for this visit.   Facility-Administered Medications Ordered in Other Visits  Medication Dose Route Frequency Provider Last Rate Last Admin  . acetaminophen (TYLENOL) tablet 650 mg  650 mg Oral Q6H Violeta Gelinas, MD   650 mg at 04/19/19 1207  . HYDROmorphone (DILAUDID) injection 1 mg  1 mg Intravenous Q2H PRN Violeta Gelinas, MD   1 mg at  04/15/19 2255  . metoprolol tartrate (LOPRESSOR) injection 5 mg  5 mg Intravenous Q6H PRN Berna Bue, MD   5 mg at 04/12/19 0146    REVIEW OF SYSTEMS:  [X]  denotes positive finding, [ ]  denotes negative finding Cardiac  Comments:  Chest pain or chest pressure:    Shortness of breath upon exertion:    Short of breath when lying flat:    Irregular heart rhythm:        Vascular    Pain in calf, thigh, or hip brought on by ambulation:    Pain in feet at night that wakes you up from your sleep:     Blood clot in your veins:    Leg swelling:         Pulmonary    Oxygen at home:    Productive cough:     Wheezing:         Neurologic    Sudden weakness in arms or legs:     Sudden numbness in arms or legs:     Sudden onset of difficulty speaking or slurred speech:    Temporary loss of vision in one eye:     Problems with dizziness:         Gastrointestinal    Blood in stool:     Vomited blood:         Genitourinary    Burning when urinating:     Blood in urine:        Psychiatric    Major depression:         Hematologic    Bleeding problems:    Problems with blood clotting too easily:        Skin    Rashes or ulcers:        Constitutional    Fever or chills:      PHYSICAL EXAM: Vitals:   05/15/20 0943  BP: (!) 154/90  Pulse: 61  Resp: 16  Temp: 98.2 F (36.8 C)  TempSrc: Temporal  SpO2: 95%  Weight: 163 lb (73.9 kg)  Height: 6\' 1"  (1.854 m)    GENERAL: The patient is a well-nourished male, in no acute distress. The vital signs are documented above. CARDIAC: There is a regular rate and rhythm.  VASCULAR:  Palpable femoral pulses bilateral groins. Palpable DP bilateral lower extremities. PULMONARY: No respiratory distress. ABDOMEN: Soft and non-tender. MUSCULOSKELETAL: There are no major deformities or cyanosis. NEUROLOGIC: No focal weakness or paresthesias are detected. PSYCHIATRIC: The patient has a normal affect.  DATA:   AAA duplex today  shows his aneurysm increased from 4.4 cm to 4.6 cm over the last year.  Assessment/Plan:  65 year old male presents for 1 year follow-up of a 4.4 cm AAA discovered incidentally last year on trauma CT scan.  On duplex today his aneurysm increased from 4.4 to 4.6 cm.  I discussed current guidelines are to repair these a greater than 5.5 cm in men unless there is rapid growth in the aneurysm.  Given his aneurysm has only increased 2 mm in the last year there is no evidence of rapid growth.  I will plan to see him in 6 months with a AAA duplex here in the office.  I discussed the importance of smoking cessation.   Cephus Shelling, MD [facility] Office: [phone]

## 2020-05-21 ENCOUNTER — Other Ambulatory Visit: Payer: Self-pay

## 2020-05-21 DIAGNOSIS — I714 Abdominal aortic aneurysm, without rupture, unspecified: Secondary | ICD-10-CM

## 2020-05-22 DIAGNOSIS — H938X2 Other specified disorders of left ear: Secondary | ICD-10-CM | POA: Diagnosis not present

## 2020-09-21 DIAGNOSIS — H906 Mixed conductive and sensorineural hearing loss, bilateral: Secondary | ICD-10-CM | POA: Diagnosis not present

## 2020-09-21 DIAGNOSIS — H7102 Cholesteatoma of attic, left ear: Secondary | ICD-10-CM | POA: Diagnosis not present

## 2020-11-22 DIAGNOSIS — H7102 Cholesteatoma of attic, left ear: Secondary | ICD-10-CM | POA: Diagnosis not present

## 2020-11-22 DIAGNOSIS — H906 Mixed conductive and sensorineural hearing loss, bilateral: Secondary | ICD-10-CM | POA: Diagnosis not present

## 2020-12-04 ENCOUNTER — Other Ambulatory Visit: Payer: Self-pay

## 2020-12-04 ENCOUNTER — Ambulatory Visit (HOSPITAL_COMMUNITY)
Admission: RE | Admit: 2020-12-04 | Discharge: 2020-12-04 | Disposition: A | Discharge status: Home / Self Care | Payer: BC Managed Care – PPO | Source: Ambulatory Visit | Attending: Vascular Surgery | Admitting: Vascular Surgery

## 2020-12-04 ENCOUNTER — Ambulatory Visit (INDEPENDENT_AMBULATORY_CARE_PROVIDER_SITE_OTHER): Payer: BC Managed Care – PPO | Admitting: Vascular Surgery

## 2020-12-04 ENCOUNTER — Encounter: Payer: Self-pay | Admitting: Vascular Surgery

## 2020-12-04 VITALS — BP 132/91 | HR 70 | Temp 98.7°F | Wt 167.3 lb

## 2020-12-04 DIAGNOSIS — I714 Abdominal aortic aneurysm, without rupture, unspecified: Secondary | ICD-10-CM | POA: Insufficient documentation

## 2020-12-04 DIAGNOSIS — I7143 Infrarenal abdominal aortic aneurysm, without rupture: Secondary | ICD-10-CM | POA: Diagnosis not present

## 2020-12-04 NOTE — Progress Notes (Signed)
Patient name: Dale Perez MRN: [id number] DOB: 1955/11/23 Sex: male  REASON FOR CONSULT: 6 month follow-up for 4.6 cm abdominal aortic aneurysm  HPI: Dale Perez is a 65 y.o. male, with history of tobacco abuse who presents for 6 month follow-up of 4.6 cm abdominal aortic aneurysm discovered incidentally after fall.  Patient fell 17 feet out of a tree and underwent trauma work-up.  Ultimately he sustained number of rib fractures and was found to have a 4.4 cm abdominal aortic aneurysm on a CT from 04/11/2019.  He denies any prior knowledge of his aneurysm.  He has had only an appendectomy for abdominal surgery.    On follow-up today he is smoking a pack a day.  He reports no new abdominal or back pain.  No new concerns.  Past Medical History:  Diagnosis Date   AAA (abdominal aortic aneurysm) 04/2019   4.4cm    Past Surgical History:  Procedure Laterality Date   APPENDECTOMY     COLONOSCOPY  11/2008   Rourk; sigmoid diverticula, otherwise normal exam. Repeat in 2020.    COLONOSCOPY N/A 09/09/2019   Procedure: COLONOSCOPY;  Surgeon: Corbin Ade, MD;  [facility];  Service: Endoscopy;  Laterality: N/A;  9:45am   I & D EXTREMITY Left 08/10/2015   Procedure: IRRIGATION AND DEBRIDEMENT EXTREMITY;  Surgeon: Mack Hook, MD;  Location: [facility] OR;  Service: Orthopedics;  Laterality: Left;   KNEE CARTILAGE SURGERY Right 02/2019   KNEE SURGERY     SHOULDER ARTHROSCOPY Left 08/2018   SHOULDER SURGERY     TENDON REPAIR N/A 08/10/2015   Procedure: TENDON REPAIR WITH WOUND CLOSURE;  Surgeon: Mack Hook, MD;  Location: [facility] OR;  Service: Orthopedics;  Laterality: N/A;   TONSILLECTOMY      Family History  Problem Relation Age of Onset   Heart attack Father    Colon cancer Neg Hx    Colon polyps Neg Hx     SOCIAL HISTORY: Social History   Socioeconomic History   Marital status: Married    Spouse name: Not on file   Number of children: Not on file   Years of  education: Not on file   Highest education level: Not on file  Occupational History   Not on file  Tobacco Use   Smoking status: Every Day    Packs/day: 1.00    Years: 40.00    Pack years: 40.00    Types: Cigarettes   Smokeless tobacco: Never  Vaping Use   Vaping Use: Never used  Substance and Sexual Activity   Alcohol use: Yes    Alcohol/week: 1.0 standard drink    Types: 1 Cans of beer per week    Comment: 1-2 drinks a month.    Drug use: Never   Sexual activity: Yes  Other Topics Concern   Not on file  Social History Narrative   ** Merged History Encounter **       Social Determinants of Health   Financial Resource Strain: Not on file  Food Insecurity: Not on file  Transportation Needs: Not on file  Physical Activity: Not on file  Stress: Not on file  Social Connections: Not on file  Intimate Partner Violence: Not on file    Allergies  Allergen Reactions   Penicillins Other (See Comments)    unknown   Quinolones Other (See Comments)    Patient has an AAA Patient has an AAA   Aspirin Hives, Rash and Other (See Comments)  Childhood / able to take 81 mg Childhood / able to take 81 mg    Current Outpatient Medications  Medication Sig Dispense Refill   acetaminophen (TYLENOL) 325 MG tablet Take 1-2 tablets (325-650 mg total) by mouth every 4 (four) hours as needed for mild pain. (Patient not taking: No sig reported)     naproxen sodium (ALEVE) 220 MG tablet Take 440 mg by mouth daily.      No current facility-administered medications for this visit.   Facility-Administered Medications Ordered in Other Visits  Medication Dose Route Frequency Provider Last Rate Last Admin   acetaminophen (TYLENOL) tablet 650 mg  650 mg Oral Q6H Violeta Gelinas, MD   650 mg at 04/19/19 1207   HYDROmorphone (DILAUDID) injection 1 mg  1 mg Intravenous Q2H PRN Violeta Gelinas, MD   1 mg at 04/15/19 2255   metoprolol tartrate (LOPRESSOR) injection 5 mg  5 mg Intravenous Q6H PRN  Berna Bue, MD   5 mg at 04/12/19 0146    REVIEW OF SYSTEMS:  [X]  denotes positive finding, [ ]  denotes negative finding Cardiac  Comments:  Chest pain or chest pressure:    Shortness of breath upon exertion:    Short of breath when lying flat:    Irregular heart rhythm:        Vascular    Pain in calf, thigh, or hip brought on by ambulation:    Pain in feet at night that wakes you up from your sleep:     Blood clot in your veins:    Leg swelling:         Pulmonary    Oxygen at home:    Productive cough:     Wheezing:         Neurologic    Sudden weakness in arms or legs:     Sudden numbness in arms or legs:     Sudden onset of difficulty speaking or slurred speech:    Temporary loss of vision in one eye:     Problems with dizziness:         Gastrointestinal    Blood in stool:     Vomited blood:         Genitourinary    Burning when urinating:     Blood in urine:        Psychiatric    Major depression:         Hematologic    Bleeding problems:    Problems with blood clotting too easily:        Skin    Rashes or ulcers:        Constitutional    Fever or chills:      PHYSICAL EXAM: Vitals:   12/04/20 0948  BP: (!) 132/91  Pulse: 70  Temp: 98.7 F (37.1 C)  TempSrc: Skin  SpO2: 98%  Weight: 167 lb 4.8 oz (75.9 kg)    GENERAL: The patient is a well-nourished male, in no acute distress. The vital signs are documented above. CARDIAC: There is a regular rate and rhythm.  VASCULAR:  Palpable femoral pulses bilateral groins. Palpable DP bilateral lower extremities. PULMONARY: No respiratory distress. ABDOMEN: Soft and non-tender.  No pain with palpation of aneurysm. MUSCULOSKELETAL: There are no major deformities or cyanosis. NEUROLOGIC: No focal weakness or paresthesias are detected. PSYCHIATRIC: The patient has a normal affect.  DATA:   AAA duplex today shows his aneurysm is stable at 4.6 cm over the last 6  months.  Assessment/Plan:  65 year old male presents for 6 month follow-up of a 4.6 cm AAA discovered incidentally.  On duplex today his aneurysm is stable at 4.6 cm.  I discussed current guidelines are to repair these a greater than 5.5 cm in men unless there is rapid growth in the aneurysm.  No significant growth over past 6 months.  I will plan to see him in 6 months with a AAA duplex here in the office.  I discussed the importance of smoking cessation again.   Cephus Shelling, MD [facility] [place] Office: [phone]

## 2020-12-05 ENCOUNTER — Other Ambulatory Visit: Payer: Self-pay

## 2020-12-05 DIAGNOSIS — I7143 Infrarenal abdominal aortic aneurysm, without rupture: Secondary | ICD-10-CM

## 2021-03-06 DIAGNOSIS — M25561 Pain in right knee: Secondary | ICD-10-CM | POA: Diagnosis not present

## 2021-03-14 DIAGNOSIS — M25561 Pain in right knee: Secondary | ICD-10-CM | POA: Diagnosis not present

## 2021-03-21 DIAGNOSIS — M1711 Unilateral primary osteoarthritis, right knee: Secondary | ICD-10-CM | POA: Diagnosis not present

## 2021-04-09 DIAGNOSIS — Z6822 Body mass index (BMI) 22.0-22.9, adult: Secondary | ICD-10-CM | POA: Diagnosis not present

## 2021-04-09 DIAGNOSIS — Z0001 Encounter for general adult medical examination with abnormal findings: Secondary | ICD-10-CM | POA: Diagnosis not present

## 2021-04-09 DIAGNOSIS — R350 Frequency of micturition: Secondary | ICD-10-CM | POA: Diagnosis not present

## 2021-04-09 DIAGNOSIS — E663 Overweight: Secondary | ICD-10-CM | POA: Diagnosis not present

## 2021-04-09 DIAGNOSIS — Z1331 Encounter for screening for depression: Secondary | ICD-10-CM | POA: Diagnosis not present

## 2021-06-11 ENCOUNTER — Ambulatory Visit (INDEPENDENT_AMBULATORY_CARE_PROVIDER_SITE_OTHER): Payer: BC Managed Care – PPO | Admitting: Vascular Surgery

## 2021-06-11 ENCOUNTER — Ambulatory Visit (HOSPITAL_COMMUNITY)
Admission: RE | Admit: 2021-06-11 | Discharge: 2021-06-11 | Disposition: A | Discharge status: Home / Self Care | Payer: BC Managed Care – PPO | Source: Ambulatory Visit | Attending: Vascular Surgery | Admitting: Vascular Surgery

## 2021-06-11 ENCOUNTER — Encounter: Payer: Self-pay | Admitting: Vascular Surgery

## 2021-06-11 VITALS — BP 165/101 | HR 57 | Temp 97.9°F | Resp 16 | Ht 73.0 in | Wt 166.0 lb

## 2021-06-11 DIAGNOSIS — I7143 Infrarenal abdominal aortic aneurysm, without rupture: Secondary | ICD-10-CM | POA: Diagnosis not present

## 2021-06-11 NOTE — Progress Notes (Signed)
? ? ?Patient name: Dale Perez MRN: [id number] DOB: 10/19/1955 Sex: male ? ?REASON FOR CONSULT: 6 month follow-up for 4[name].6 cm abdominal aortic aneurysm ? ?HPI: ?Dale Perez is a 66 y.o. male, with history of tobacco abuse who presents for 6 month follow-up of 4.6 cm abdominal aortic aneurysm discovered incidentally after fall.  Patient fell 17 feet out of a tree and underwent trauma work-up.  Ultimately he sustained number of rib fractures and was found to have a 4.4 cm abdominal aortic aneurysm on a CT from 04/11/2019.  He denies any prior knowledge of his aneurysm.  He has had only an appendectomy for abdominal surgery.   ? ?On follow-up today he is smoking a pack every 2 days.  He reports no new abdominal or back pain (some baseline chronic back pain).  No new concerns.  No recent changes to his health. ? ?Past Medical History:  ?Diagnosis Date  ? AAA (abdominal aortic aneurysm) (HCC) 04/2019  ? 4.4cm  ? ? ?Past Surgical History:  ?Procedure Laterality Date  ? APPENDECTOMY    ? COLONOSCOPY  11/2008  ? Rourk; sigmoid diverticula, otherwise normal exam. Repeat in 2020.   ? COLONOSCOPY N/A 09/09/2019  ? Procedure: COLONOSCOPY;  Surgeon: Corbin Adeourk, Robert M, MD;  [facility];  Service: Endoscopy;  Laterality: N/A;  9:45am  ? I & D EXTREMITY Left 08/10/2015  ? Procedure: IRRIGATION AND DEBRIDEMENT EXTREMITY;  Surgeon: Mack Hookavid Thompson, MD;  [facility];  Service: Orthopedics;  Laterality: Left;  ? KNEE CARTILAGE SURGERY Right 02/2019  ? KNEE SURGERY    ? SHOULDER ARTHROSCOPY Left 08/2018  ? SHOULDER SURGERY    ? TENDON REPAIR N/A 08/10/2015  ? Procedure: TENDON REPAIR WITH WOUND CLOSURE;  Surgeon: Mack Hookavid Thompson, MD;  [facility];  Service: Orthopedics;  Laterality: N/A;  ? TONSILLECTOMY    ? ? ?Family History  ?Problem Relation Age of Onset  ? Heart attack Father   ? Colon cancer Neg Hx   ? Colon polyps Neg Hx   ? ? ?SOCIAL HISTORY: ?Social History  ? ?Socioeconomic History  ? Marital status: Married   ?  Spouse name: Not on file  ? Number of children: Not on file  ? Years of education: Not on file  ? Highest education level: Not on file  ?Occupational History  ? Not on file  ?Tobacco Use  ? Smoking status: Every Day  ?  Packs/day: 1.00  ?  Years: 40.00  ?  Pack years: 40.00  ?  Types: Cigarettes  ? Smokeless tobacco: Never  ?Vaping Use  ? Vaping Use: Never used  ?Substance and Sexual Activity  ? Alcohol use: Yes  ?  Alcohol/week: 1.0 standard drink  ?  Types: 1 Cans of beer per week  ?  Comment: 1-2 drinks a month.   ? Drug use: Never  ? Sexual activity: Yes  ?Other Topics Concern  ? Not on file  ?Social History Narrative  ? ** Merged History Encounter **  ?    ? ?Social Determinants of Health  ? ?Financial Resource Strain: Not on file  ?Food Insecurity: Not on file  ?Transportation Needs: Not on file  ?Physical Activity: Not on file  ?Stress: Not on file  ?Social Connections: Not on file  ?Intimate Partner Violence: Not on file  ? ? ?Allergies  ?Allergen Reactions  ? Penicillins Other (See Comments)  ?  unknown  ? Quinolones Other (See Comments)  ?  Patient has an  AAA ?Patient has an AAA  ? Aspirin Hives, Rash and Other (See Comments)  ?  Childhood / able to take 81 mg ?Childhood / able to take 81 mg  ? ? ?Current Outpatient Medications  ?Medication Sig Dispense Refill  ? naproxen sodium (ALEVE) 220 MG tablet Take 440 mg by mouth daily.     ? acetaminophen (TYLENOL) 325 MG tablet Take 1-2 tablets (325-650 mg total) by mouth every 4 (four) hours as needed for mild pain. (Patient not taking: Reported on 08/29/2019)    ? ?No current facility-administered medications for this visit.  ? ?Facility-Administered Medications Ordered in Other Visits  ?Medication Dose Route Frequency Provider Last Rate Last Admin  ? acetaminophen (TYLENOL) tablet 650 mg  650 mg Oral Q6H Violeta Gelinas, MD   650 mg at 04/19/19 1207  ? HYDROmorphone (DILAUDID) injection 1 mg  1 mg Intravenous Q2H PRN Violeta Gelinas, MD   1 mg at 04/15/19  2255  ? metoprolol tartrate (LOPRESSOR) injection 5 mg  5 mg Intravenous Q6H PRN Phylliss Blakes A, MD   5 mg at 04/12/19 0146  ? ? ?REVIEW OF SYSTEMS:  ?[X]  denotes positive finding, [ ]  denotes negative finding ?Cardiac  Comments:  ?Chest pain or chest pressure:    ?Shortness of breath upon exertion:    ?Short of breath when lying flat:    ?Irregular heart rhythm:    ?    ?Vascular    ?Pain in calf, thigh, or hip brought on by ambulation:    ?Pain in feet at night that wakes you up from your sleep:     ?Blood clot in your veins:    ?Leg swelling:     ?    ?Pulmonary    ?Oxygen at home:    ?Productive cough:     ?Wheezing:     ?    ?Neurologic    ?Sudden weakness in arms or legs:     ?Sudden numbness in arms or legs:     ?Sudden onset of difficulty speaking or slurred speech:    ?Temporary loss of vision in one eye:     ?Problems with dizziness:     ?    ?Gastrointestinal    ?Blood in stool:     ?Vomited blood:     ?    ?Genitourinary    ?Burning when urinating:     ?Blood in urine:    ?    ?Psychiatric    ?Major depression:     ?    ?Hematologic    ?Bleeding problems:    ?Problems with blood clotting too easily:    ?    ?Skin    ?Rashes or ulcers:    ?    ?Constitutional    ?Fever or chills:    ? ? ?PHYSICAL EXAM: ?Vitals:  ? 06/11/21 0836  ?BP: (!) 165/101  ?Pulse: (!) 57  ?Resp: 16  ?Temp: 97.9 ?F (36.6 ?C)  ?TempSrc: Temporal  ?SpO2: 94%  ?Weight: 166 lb (75.3 kg)  ?Height: 6\' 1"  (1.854 m)  ? ? ?GENERAL: The patient is a well-nourished male, in no acute distress. The vital signs are documented above. ?CARDIAC: There is a regular rate and rhythm.  ?VASCULAR:  ?Palpable femoral pulses bilateral groins. ?Palpable DP bilateral lower extremities. ?PULMONARY: No respiratory distress. ?ABDOMEN: Soft and non-tender.  No pain with palpation of aneurysm. ?MUSCULOSKELETAL: There are no major deformities or cyanosis. ?NEUROLOGIC: No focal weakness or paresthesias are detected. ?PSYCHIATRIC: The patient has a  normal  affect. ? ?DATA:  ? ?AAA duplex today shows his aneurysm has increased from 4.6 cm to 4.9 cm over the past 6 months ? ?Assessment/Plan: ? ?66 year old male presents for 6 month follow-up of a 4.6 cm AAA discovered incidentally.  On duplex today his aneurysm has increased slightly from 4.6 cm to 4.9 cm.  I discussed current guidelines are to repair these at greater than 5.5 cm in men unless there is rapid growth in the aneurysm or he develops symptoms felt to be related to his aneurysm.  I will plan to see him again in 6 months with a AAA duplex here in the office.  I discussed the importance of smoking cessation again.  Discussed he call with questions or concerns. ? ? ?Cephus Shelling, MD ?[facility] ?Office: [phone] ? ? ? ?

## 2021-06-14 ENCOUNTER — Other Ambulatory Visit: Payer: Self-pay | Admitting: *Deleted

## 2021-06-14 DIAGNOSIS — I7143 Infrarenal abdominal aortic aneurysm, without rupture: Secondary | ICD-10-CM

## 2021-07-04 IMAGING — CT CT CERVICAL SPINE W/O CM
3 of 4 series · 12 of 33 positions shown, 14 images · non-contrast
Comparison: CT 08/07/2017

CLINICAL DATA: Head trauma, headache, fell 17 feet from a tree. No
obvious deformities. Abrasion to left posterior thigh, positive loss
of consciousness, GCS 15

EXAM:
CT HEAD WITHOUT CONTRAST
CT CERVICAL SPINE WITHOUT CONTRAST
TECHNIQUE: Multidetector CT imaging of the head and cervical spine was
performed following the standard protocol without intravenous
contrast. Multiplanar CT image reconstructions of the cervical spine
were also generated.

[Series 8: sag bone · sagittal · 0.28mm/px · 5 of 72 slices shown, 6 images]
[im 24/72  bone]
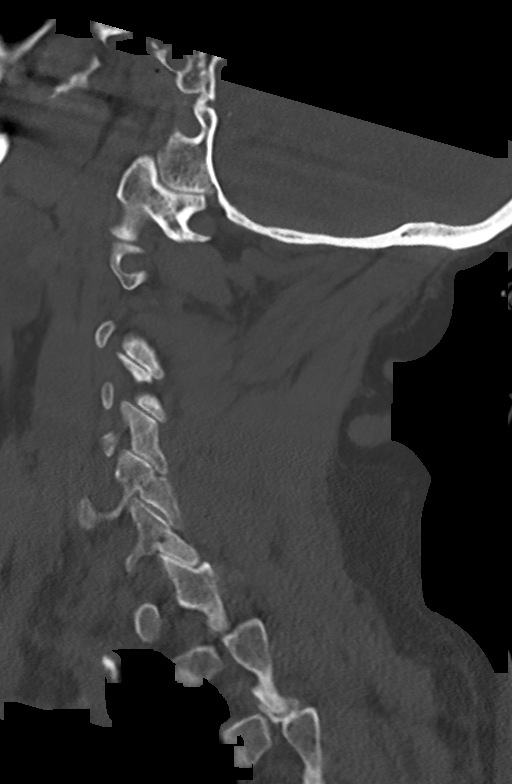
[im 30/72  bone]
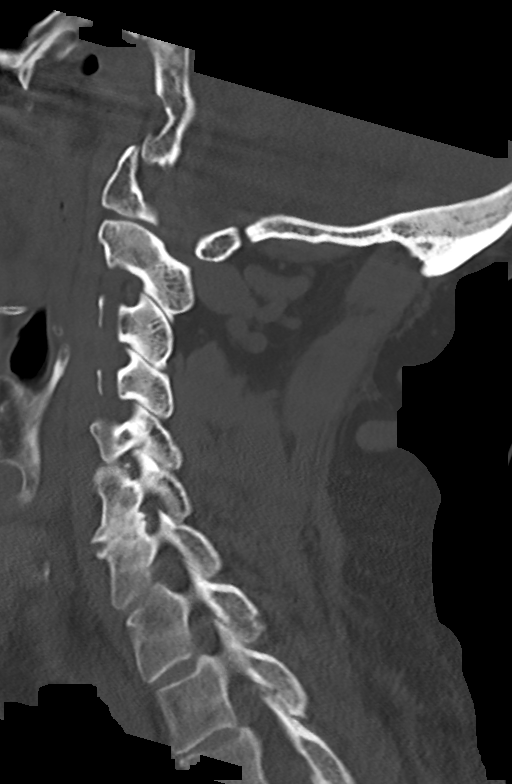
[im 36/72  soft-tissue]
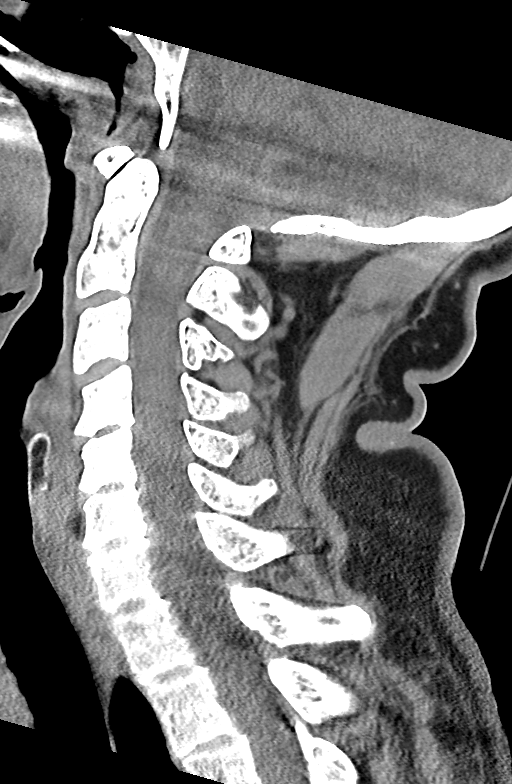
[im 36/72  bone]
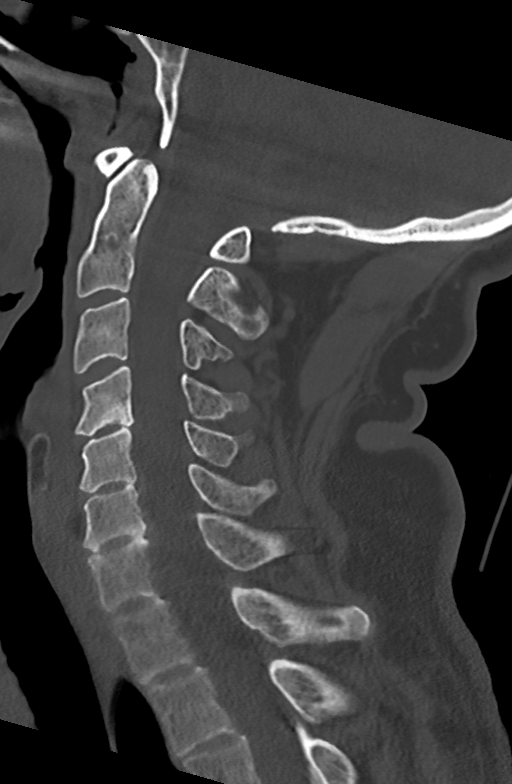
[im 42/72  bone]
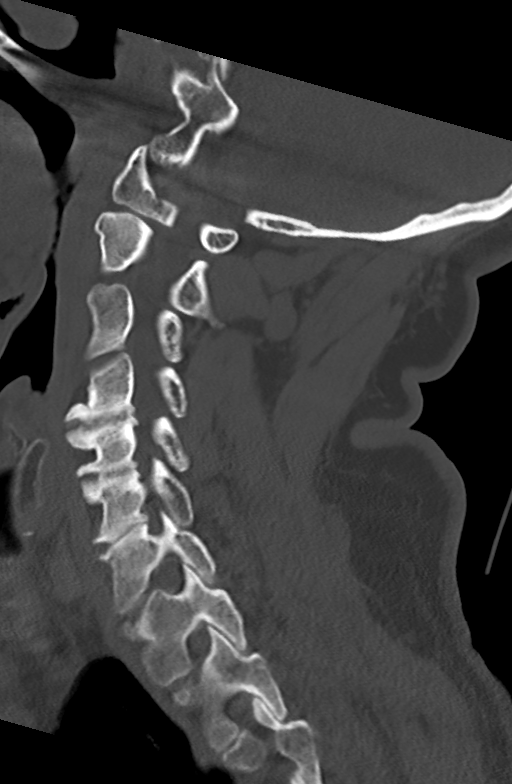
[im 48/72  bone]
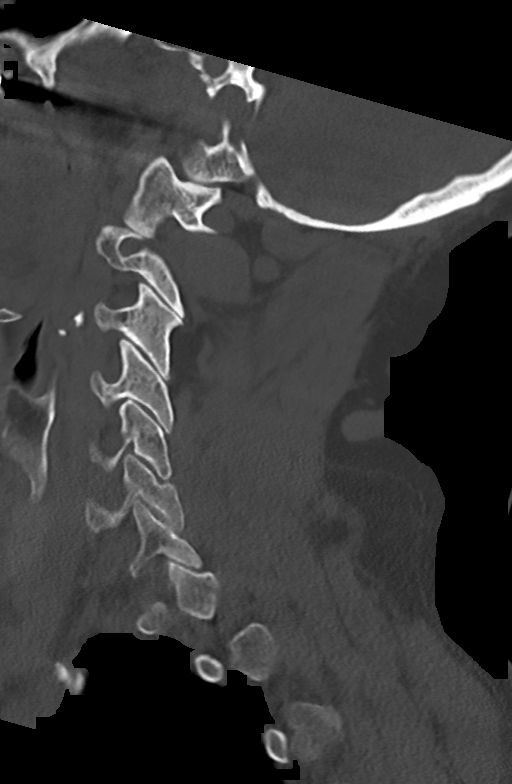

[Series 9: cor bone · coronal · 0.27mm/px · 3 of 66 slices shown]
[im 14/66  bone]
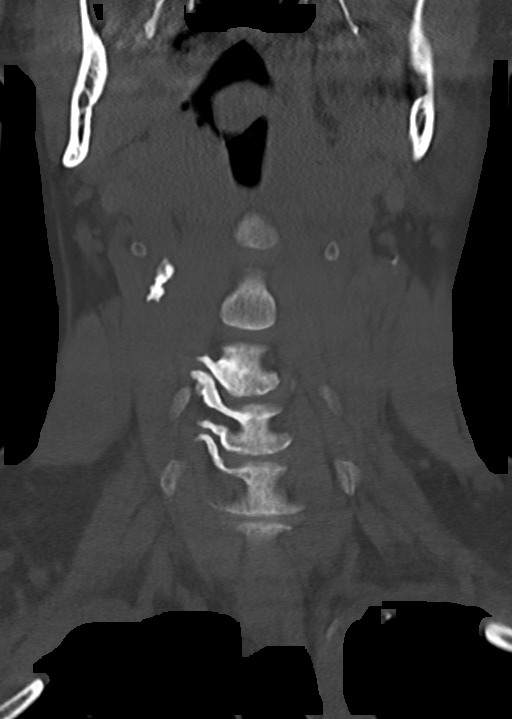
[im 27/66  bone]
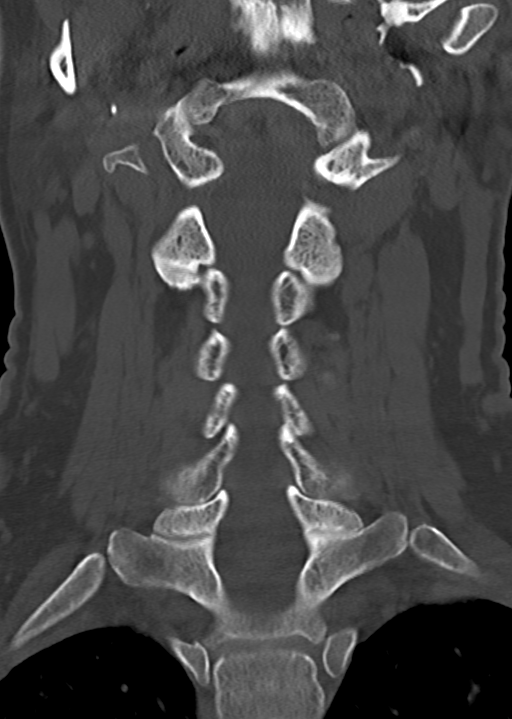
[im 40/66  bone]
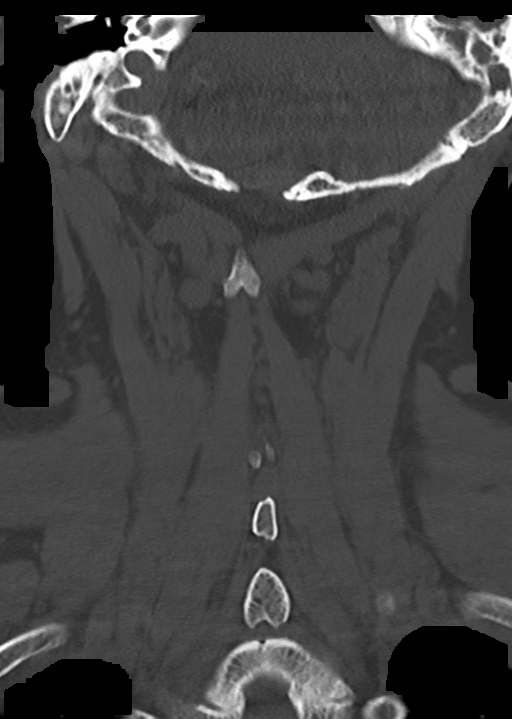

[Series 10: orthogonal axials · axial · 0.21mm/px · z∈[+1194,+1326]mm · 4 of 102 slices shown, 5 images]
[im 17/102  soft-tissue]
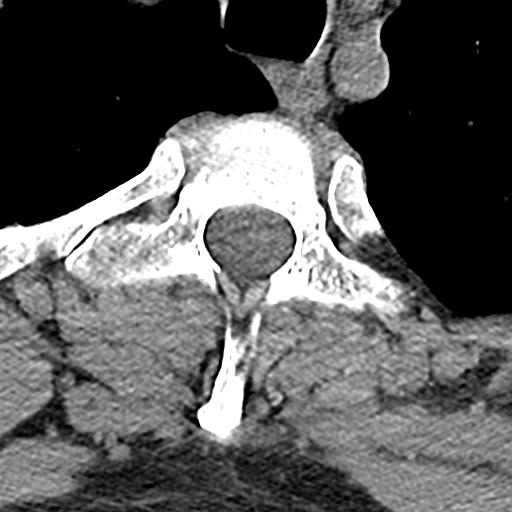
[im 17/102  bone]
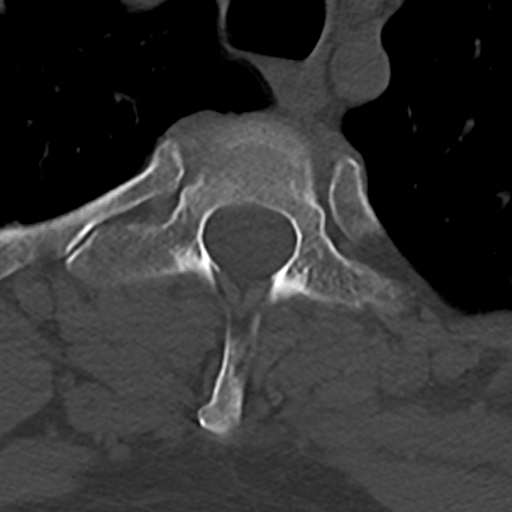
[im 34/102  bone]
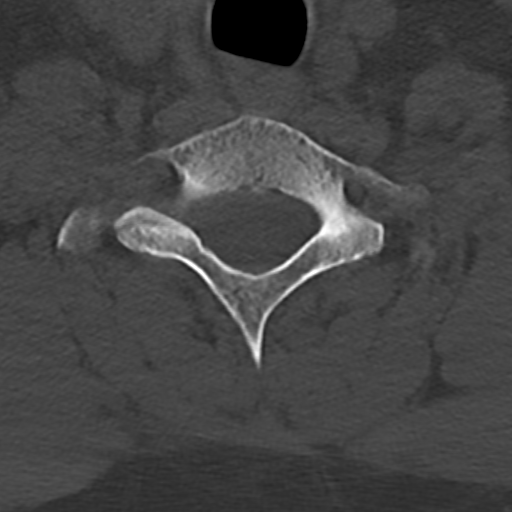
[im 68/102  bone]
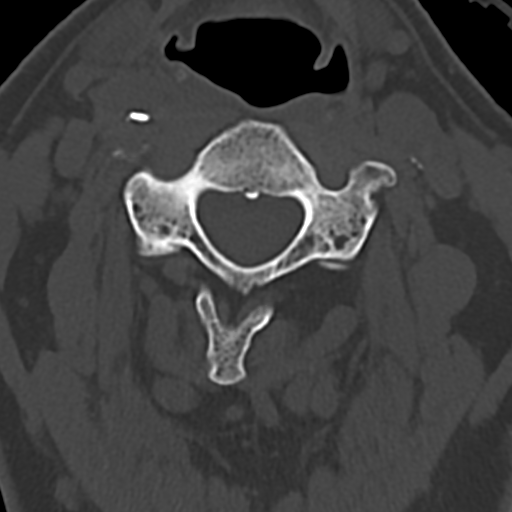
[im 85/102  bone]
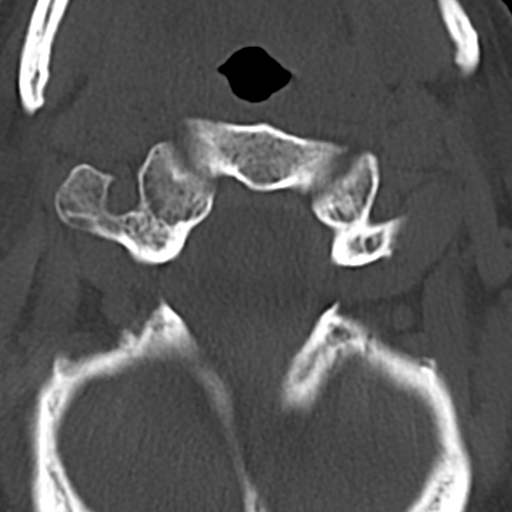

[12 of 33 positions shown; findings below may reference images not displayed]

FINDINGS: CT HEAD FINDINGS

Brain: No evidence of acute infarction, hemorrhage, hydrocephalus,
extra-axial collection or mass lesion/mass effect. Symmetric
prominence of the ventricles, cisterns and sulci compatible with
parenchymal volume loss. Patchy areas of white matter
hypoattenuation are most compatible with chronic microvascular
angiopathy.

Vascular: Atherosclerotic calcification of the carotid siphons. No
hyperdense vessel.

Skull: No calvarial fracture or scalp swelling. Mild soft tissue
thickening superficial to the left zygoma. Most compatible with a
small dermal inclusion cyst. Few benign scalp calcifications are
present.

Sinuses/Orbits: Minimal mucosal thickening in the ethmoid sinuses.
Remaining paranasal sinuses are predominantly clear. Hypo
pneumatization of the mastoids with a left mastoid effusion and
hyperostotic features similar to prior and suggesting chronicity.
Soft tissue attenuation attic of the left middle ear cavity with
destruction of the left ossicular chain most suspicious for a left
cholesteatoma.

Other: Small amount of debris in the external auditory canal on the
right.

CT CERVICAL SPINE FINDINGS

Alignment: Cervical stabilization collar is in place at the time of
examination. Preservation of the normal cervical lordosis without
traumatic listhesis. No abnormally widened, perched or jumped
facets. Normal alignment of the craniocervical and atlantoaxial
articulations.

Skull base and vertebrae: No acute fracture. No primary bone lesion
or focal pathologic process.

Soft tissues and spinal canal: No pre or paravertebral fluid or
swelling. No visible canal hematoma.

Disc levels: Multilevel intervertebral disc height loss with
spondylitic endplate changes. Features are most pronounced C4-C7
where disc osteophyte complexes efface the ventral thecal sac
without significant canal stenosis. Mild uncinate spurring and facet
arthropathic changes at these levels do result in mild bilateral
neural foramina narrowing.

Upper chest: No acute abnormality in the upper chest or imaged lung
apices.

Other: Normal thyroid. Question postsurgical changes along the
posterior left thyroid lobe. Cervical carotid atherosclerosis is
noted.
IMPRESSION: 1. No CT evidence of acute intracranial process. No acute scalp
swelling or calvarial fracture.
2. Mild parenchymal volume loss and chronic microvascular
angiopathy.
3. Chronic left-sided mastoiditis. Soft tissue attenuation attic of
the left middle ear cavity with progressive erosion of the left
ossicular chain most suspicious for a recurrent left cholesteatoma.
4. No acute fracture or traumatic listhesis of the cervical spine.
5. Mild multilevel intervertebral spondylitic changes, most
pronounced C4-C7. Detailed above.

## 2021-07-04 IMAGING — CT CT CHEST W/ CM
2 of 5 series · 10 of 36 positions shown, 12 images · IV contrast (omnipaque)
Comparison: CT abdomen pelvis 07/24/2015

CLINICAL DATA: Lower back pain, trauma, fall approximately 17 foot
from tree PE stating is

EXAM:
CT CHEST, ABDOMEN, AND PELVIS WITH CONTRAST
TECHNIQUE: Multidetector CT imaging of the chest, abdomen and pelvis was
performed following the standard protocol during bolus
administration of intravenous contrast.
CONTRAST:  100mL OMNIPAQUE IOHEXOL 300 MG/ML  SOLN

[Series 3: cap with · axial · 0.72mm/px · z∈[+644,+1194]mm · 7 of 138 slices shown, 9 images]
[im 14/138  mediastinal]
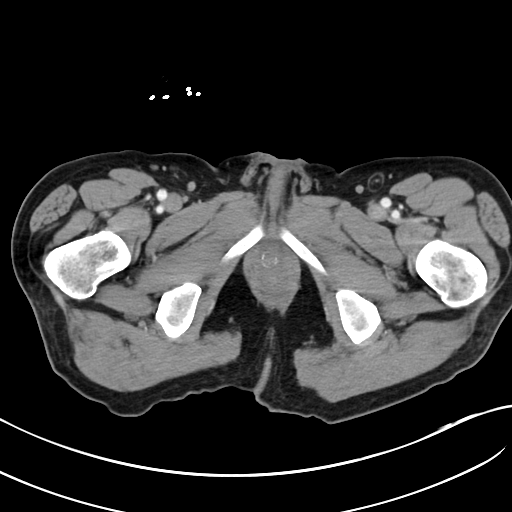
[im 14/138  lung]
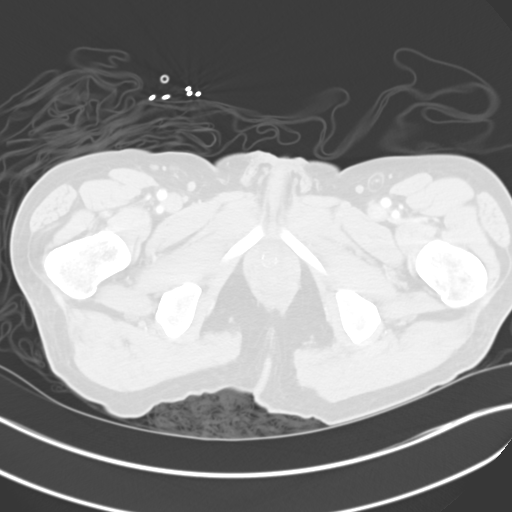
[im 28/138  lung]
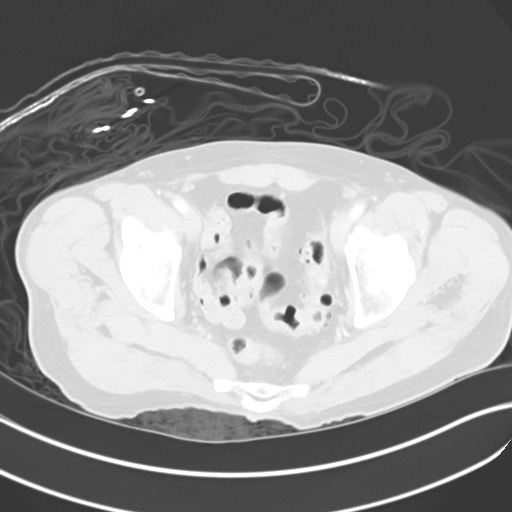
[im 55/138  lung]
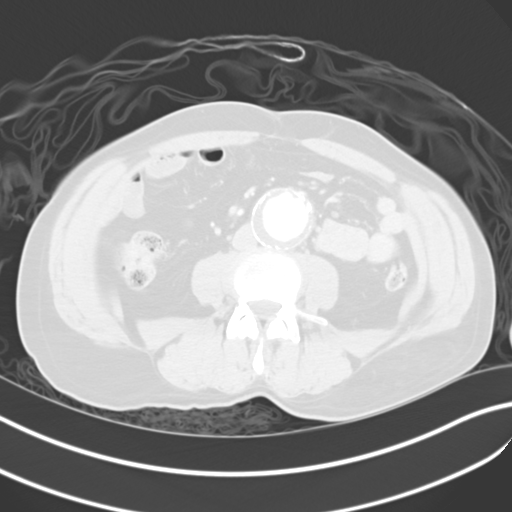
[im 69/138  lung]
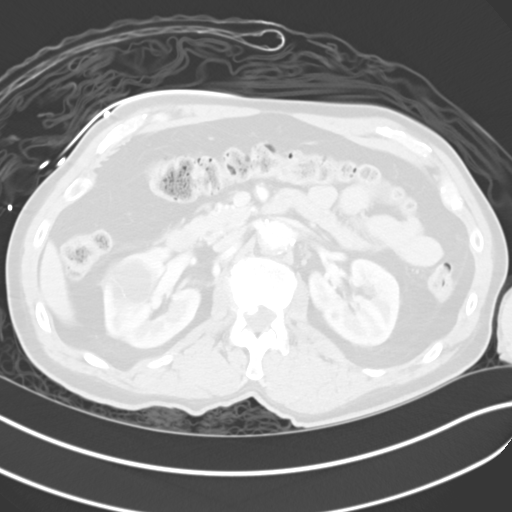
[im 83/138  mediastinal]
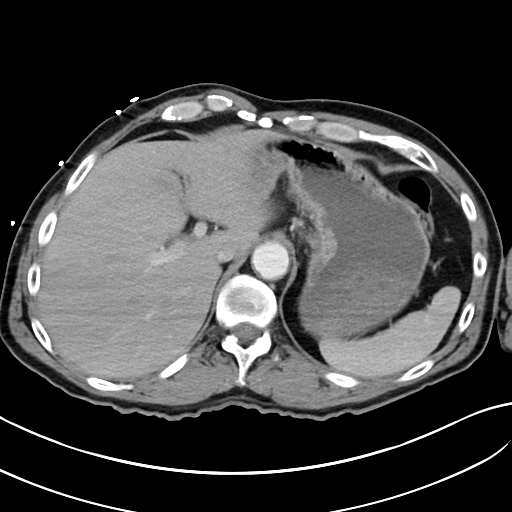
[im 83/138  lung]
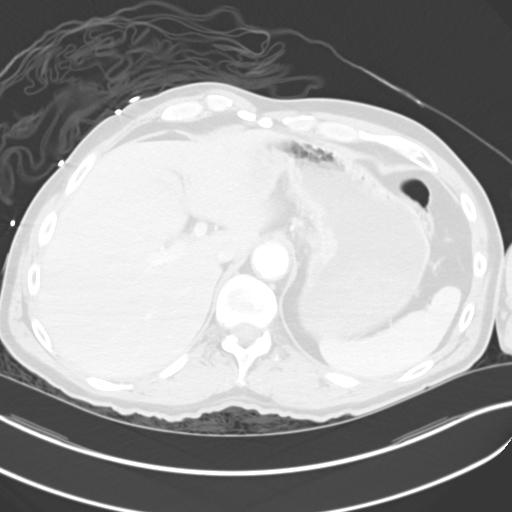
[im 110/138  lung]
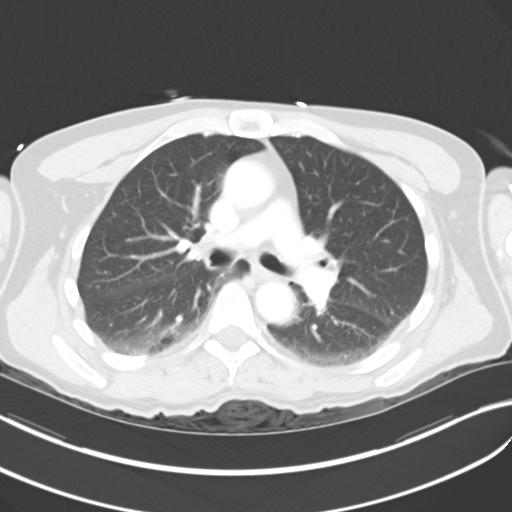
[im 124/138  lung]
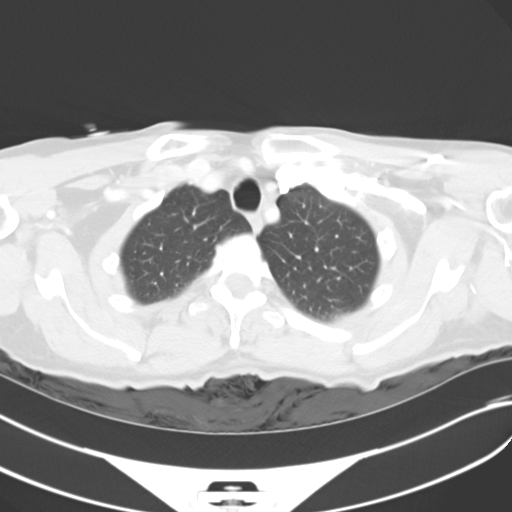

[Series 6: cor · coronal · 0.76mm/px · 3 of 83 slices shown]
[im 17/83  lung]
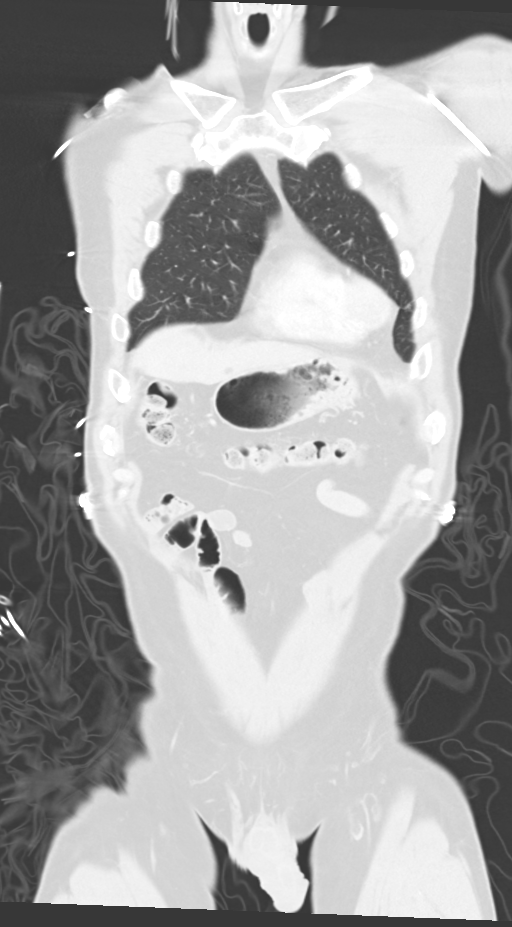
[im 33/83  lung]
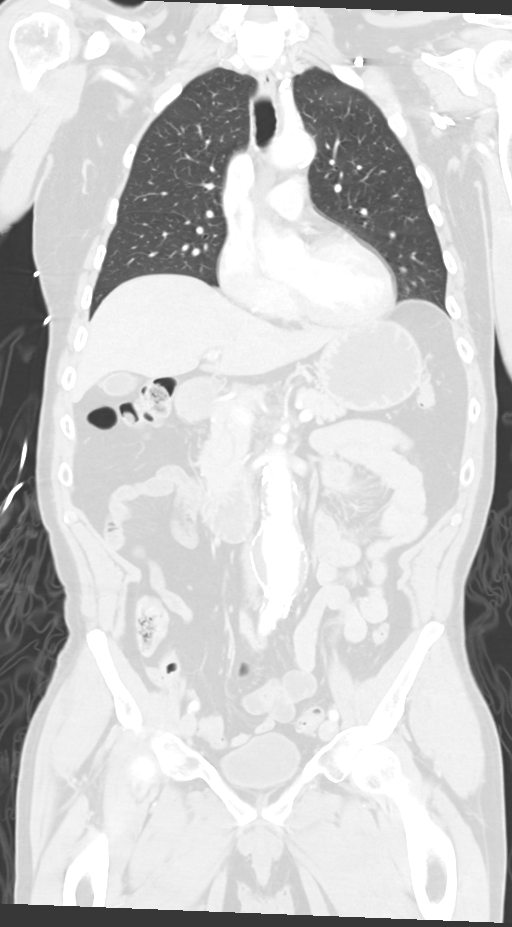
[im 50/83  lung]
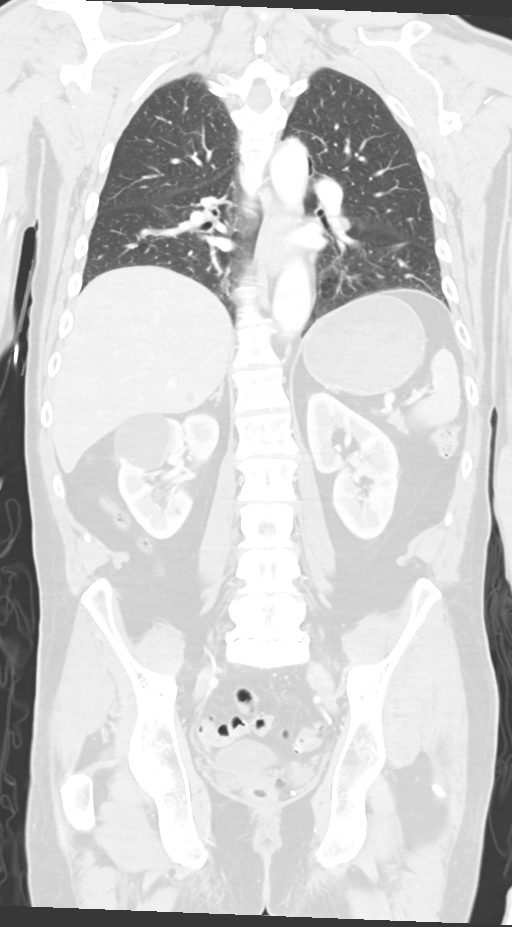

[10 of 36 positions shown; findings below may reference images not displayed]

FINDINGS: CT CHEST FINDINGS

Cardiovascular: The aorta is normal caliber. No intramural hematoma,
dissection flap or other acute luminal abnormality of the aorta is
seen. No periaortic stranding or hemorrhage. Atheromatous plaque
seen throughout the thoracic aorta and within the normally branching
proximal great vessels including some mild proximal atheromatous
narrowing of the left common carotid and subclavian arteries and
right vertebral artery. Normal heart size. No pericardial effusion.
Coronary artery calcifications are present central pulmonary
arteries are normal caliber. No large central filling defects on
this non tailored examination of the pulmonary arteries.

Mediastinum/Nodes: No mediastinal fluid or gas. Normal thyroid gland
and thoracic inlet. No acute abnormality of the trachea or
esophagus. No worrisome mediastinal, hilar or axillary adenopathy.

Lungs/Pleura: Small left apical and medial pneumothorax and trace
pleural thickening, likely hemothorax. Dependent ground-glass
towards the lung bases most likely reflects atelectasis though some
underlying pulmonary contusion could have a similar appearance. No
other acute traumatic findings of the lung parenchyma.

Musculoskeletal: Posterior left fourth through twelfth minimally
displaced rib fractures. Additional anterior left second through
sixth rib fractures. Findings result in segmental fractures of 4th
to 6th ribs. No visible right rib fractures. Scapula appear intact.
No acute traumatic abnormality of the imaged shoulder girdles. No
sternal fracture is seen. Complete fusion across the sternomanubrial
joint is noted incidentally. Subtle sclerotic band subjacent to the
superior endplate cortex at T8 with some mild anterior wedging may
reflect a subcortical impaction fracture. No adjacent paraspinal
hemorrhage or visible canal hematoma.

CT ABDOMEN PELVIS FINDINGS

Hepatobiliary: No direct hepatic injury or perihepatic hematoma.
Small subcentimeter hypoattenuating foci in the anterior left lobe
and posterior right lobe liver too small to fully characterize on CT
imaging but statistically likely benign. No focal liver abnormality
is seen. No gallstones, gallbladder wall thickening, or biliary
dilatation. Mild low-attenuation nodule at the tip of the
gallbladder fundus, likely adenomyomatosis.

Pancreas: Normal uniform enhancement of the pancreas. No evidence of
pancreatic contusion or ductal disruption. No peripancreatic
inflammation.

Spleen: No direct splenic injury or perisplenic hematoma.

Adrenals/Urinary Tract: No adrenal hemorrhage or suspicious adrenal
lesions. 4.5 cm cystic appearing lesion in the upper pole right
kidney with thin inferior septation, Bosniak II. Few punctate
nonobstructing calculi in both kidneys as well as several vascular
calcifications. No obstructive urolithiasis or hydronephrosis.
Kidneys enhance and excrete symmetrically without extravasation of
contrast on excretory phase imaging. No evidence of direct bladder
injury.

Stomach/Bowel: Distal esophagus, stomach and duodenum are
unremarkable. No small bowel dilatation or wall thickening. No
colonic dilatation or wall thickening. The appendix is surgically
absent. Scattered colonic diverticula without focal pericolonic
inflammation to suggest diverticulitis. No mesenteric hematoma or
mesenteric contusion is evident.

Vascular/Lymphatic: Atherosclerotic plaque within the normal caliber
aorta. Fusiform aneurysmal dilatation of the infrarenal abdominal
aorta to 4.4 cm in size with eccentric calcified and noncalcified
mural plaque. Additional plaque noted in the proximal great vessels
including mild narrowing in the proximal splenic artery and at the
IMA origin. Atheromatous disease is seen extending into the proximal
inflow and outflow levels. Moderate narrowing at the distal right
common iliac at the level of the bifurcation. No periaortic
stranding or hemorrhage. No evidence of acute vascular injury or
dissection. Retroaortic left renal vein is noted. No suspicious or
enlarged lymph nodes in the included lymphatic chains.

Reproductive: Mild prostatomegaly. Coarse eccentric benign
calcifications.

Other: No abdominopelvic free fluid or air. No large abdominal wall
dehiscence. Contusive changes noted across the left flank and chest
wall. No bowel containing hernias.

Musculoskeletal: Multilevel degenerative changes noted in the lower
lumbar spine. Acute fracture or traumatic listhesis. Degenerative
retrolisthesis L5 on S1 is noted with maximal discogenic changes at
this level. Bony pelvis is intact. Proximal femora are intact and
well seated within the acetabula.
IMPRESSION: Traumatic

1. Posterior left fourth through twelfth minimally displaced rib
fractures. Additional anterior left second through sixth rib
fractures. Given the extent of fractures including contiguous
segmental fractures, close clinical assessment for flail chest
morphology is recommended.
2. Small left apical and medial pneumothorax and trace pleural
thickening, likely hemothorax. Some dependent atelectasis versus
pulmonary contusion noted in bases.
3. Subtle sclerotic band subjacent to the superior endplate cortex
at T8 with some mild anterior wedging may reflect a subcortical
impaction fracture. No adjacent paraspinal hemorrhage or visible
canal hematoma.

Nontraumatic

1. 4.5 cm Bosniak II cyst within the upper pole of the right kidney.
2. Coronary artery atherosclerosis.
3. Colonic diverticulosis without evidence of acute diverticulitis.
4. 4.4 cm infrarenal abdominal aortic aneurysm. Reflects interval
enlargement since 8780 comparison. Recommend followup by ultrasound
in 1 year. This recommendation follows ACR consensus guidelines:
White Paper of the ACR Incidental Findings Committee II on Vascular
Findings. [HOSPITAL] 6092; [DATE]. Aortic aneurysm NOS
(CKPPG-JWP.3).
5. Multilevel degenerative changes in the lower lumbar spine with
associated grade 1 retrolisthesis L5 on S1.

These results were called by telephone at the time of interpretation
on 04/09/2019 at [DATE] to provider ANDRIUSDAIVA LUINYS , who verbally
acknowledged these results.

## 2021-09-18 ENCOUNTER — Other Ambulatory Visit: Payer: Self-pay | Admitting: Family Medicine

## 2021-09-18 ENCOUNTER — Other Ambulatory Visit (HOSPITAL_COMMUNITY): Payer: Self-pay | Admitting: Family Medicine

## 2021-09-18 ENCOUNTER — Ambulatory Visit (HOSPITAL_COMMUNITY)
Admission: RE | Admit: 2021-09-18 | Discharge: 2021-09-18 | Disposition: A | Discharge status: Home / Self Care | Payer: BC Managed Care – PPO | Source: Ambulatory Visit | Attending: Family Medicine | Admitting: Family Medicine

## 2021-09-18 DIAGNOSIS — Z6822 Body mass index (BMI) 22.0-22.9, adult: Secondary | ICD-10-CM | POA: Diagnosis not present

## 2021-09-18 DIAGNOSIS — M25552 Pain in left hip: Secondary | ICD-10-CM

## 2021-09-18 DIAGNOSIS — W14XXXA Fall from tree, initial encounter: Secondary | ICD-10-CM | POA: Diagnosis not present

## 2021-09-18 DIAGNOSIS — M79662 Pain in left lower leg: Secondary | ICD-10-CM | POA: Diagnosis not present

## 2021-09-20 ENCOUNTER — Ambulatory Visit (HOSPITAL_COMMUNITY)
Admission: RE | Admit: 2021-09-20 | Discharge: 2021-09-20 | Disposition: A | Discharge status: Home / Self Care | Payer: BC Managed Care – PPO | Source: Ambulatory Visit | Attending: Family Medicine | Admitting: Family Medicine

## 2021-09-20 DIAGNOSIS — M79662 Pain in left lower leg: Secondary | ICD-10-CM | POA: Insufficient documentation

## 2022-02-07 DIAGNOSIS — H7102 Cholesteatoma of attic, left ear: Secondary | ICD-10-CM | POA: Diagnosis not present

## 2022-02-07 DIAGNOSIS — H9212 Otorrhea, left ear: Secondary | ICD-10-CM | POA: Diagnosis not present

## 2022-04-01 ENCOUNTER — Encounter: Payer: Self-pay | Admitting: Vascular Surgery

## 2022-04-01 ENCOUNTER — Ambulatory Visit (HOSPITAL_COMMUNITY)
Admission: RE | Admit: 2022-04-01 | Discharge: 2022-04-01 | Disposition: A | Discharge status: Home / Self Care | Payer: Medicare Other | Source: Ambulatory Visit | Attending: Vascular Surgery | Admitting: Vascular Surgery

## 2022-04-01 ENCOUNTER — Ambulatory Visit (INDEPENDENT_AMBULATORY_CARE_PROVIDER_SITE_OTHER): Payer: Medicare Other | Admitting: Vascular Surgery

## 2022-04-01 VITALS — BP 138/88 | HR 63 | Temp 97.6°F | Resp 16 | Ht 73.0 in | Wt 165.0 lb

## 2022-04-01 DIAGNOSIS — I7143 Infrarenal abdominal aortic aneurysm, without rupture: Secondary | ICD-10-CM | POA: Diagnosis not present

## 2022-04-01 NOTE — Progress Notes (Signed)
Patient name: Dale Perez MRN: [id number] DOB: 19-Jul-1955 Sex: male  REASON FOR CONSULT: 6 month follow-up for 4.9 cm abdominal aortic aneurysm  HPI: Dale Perez is a 67 y.o. male, with history of tobacco abuse who presents for 6 month follow-up of 4.9 cm abdominal aortic aneurysm discovered incidentally after fall.  Patient fell 17 feet out of a tree and underwent trauma work-up.  Ultimately he sustained number of rib fractures and was found to have a 4.4 cm abdominal aortic aneurysm on a CT from 04/11/2019.  He denies any prior knowledge of his aneurysm.  He has had only an appendectomy for abdominal surgery.    On follow-up today he is smoking a pack a day.  He reports no new abdominal or back pain.  No recent changes to his health.  No new concerns.  Past Medical History:  Diagnosis Date   AAA (abdominal aortic aneurysm) ([place]) 04/2019   4.4cm    Past Surgical History:  Procedure Laterality Date   APPENDECTOMY     COLONOSCOPY  11/2008   Rourk; sigmoid diverticula, otherwise normal exam. Repeat in 2020.    COLONOSCOPY N/A 09/09/2019   Procedure: COLONOSCOPY;  Surgeon: Daneil Dolin, MD;  [facility];  Service: Endoscopy;  Laterality: N/A;  9:45am   I & D EXTREMITY Left 08/10/2015   Procedure: IRRIGATION AND DEBRIDEMENT EXTREMITY;  Surgeon: Milly Jakob, MD;  Location: [place];  Service: Orthopedics;  Laterality: Left;   KNEE CARTILAGE SURGERY Right 02/2019   KNEE SURGERY     SHOULDER ARTHROSCOPY Left 08/2018   SHOULDER SURGERY     TENDON REPAIR N/A 08/10/2015   Procedure: TENDON REPAIR WITH WOUND CLOSURE;  Surgeon: Milly Jakob, MD;  Location: [place];  Service: Orthopedics;  Laterality: N/A;   TONSILLECTOMY      Family History  Problem Relation Age of Onset   Heart attack Father    Colon cancer Neg Hx    Colon polyps Neg Hx     SOCIAL HISTORY: Social History   Socioeconomic History   Marital status: Married    Spouse name: Not on file   Number of  children: Not on file   Years of education: Not on file   Highest education level: Not on file  Occupational History   Not on file  Tobacco Use   Smoking status: Every Day    Packs/day: 1.00    Years: 40.00    Total pack years: 40.00    Types: Cigarettes   Smokeless tobacco: Never  Vaping Use   Vaping Use: Never used  Substance and Sexual Activity   Alcohol use: Yes    Alcohol/week: 1.0 standard drink of alcohol    Types: 1 Cans of beer per week    Comment: 1-2 drinks a month.    Drug use: Never   Sexual activity: Yes  Other Topics Concern   Not on file  Social History Narrative   ** Merged History Encounter **       Social Determinants of Health   Financial Resource Strain: Not on file  Food Insecurity: Not on file  Transportation Needs: Not on file  Physical Activity: Not on file  Stress: Not on file  Social Connections: Not on file  Intimate Partner Violence: Not on file    Allergies  Allergen Reactions   Penicillins Other (See Comments)    unknown   Quinolones Other (See Comments)    Patient has an AAA Patient has  an AAA   Aspirin Hives, Rash and Other (See Comments)    Childhood / able to take 81 mg Childhood / able to take 81 mg    Current Outpatient Medications  Medication Sig Dispense Refill   naproxen sodium (ALEVE) 220 MG tablet Take 440 mg by mouth daily.      acetaminophen (TYLENOL) 325 MG tablet Take 1-2 tablets (325-650 mg total) by mouth every 4 (four) hours as needed for mild pain. (Patient not taking: Reported on 08/29/2019)     No current facility-administered medications for this visit.   Facility-Administered Medications Ordered in Other Visits  Medication Dose Route Frequency Provider Last Rate Last Admin   acetaminophen (TYLENOL) tablet 650 mg  650 mg Oral Q6H Georganna Skeans, MD   650 mg at 04/19/19 1207   HYDROmorphone (DILAUDID) injection 1 mg  1 mg Intravenous Q2H PRN Georganna Skeans, MD   1 mg at 04/15/19 2255   metoprolol  tartrate (LOPRESSOR) injection 5 mg  5 mg Intravenous Q6H PRN Clovis Riley, MD   5 mg at 04/12/19 0146    REVIEW OF SYSTEMS:  '[X]'$  denotes positive finding, '[ ]'$  denotes negative finding Cardiac  Comments:  Chest pain or chest pressure:    Shortness of breath upon exertion:    Short of breath when lying flat:    Irregular heart rhythm:        Vascular    Pain in calf, thigh, or hip brought on by ambulation:    Pain in feet at night that wakes you up from your sleep:     Blood clot in your veins:    Leg swelling:         Pulmonary    Oxygen at home:    Productive cough:     Wheezing:         Neurologic    Sudden weakness in arms or legs:     Sudden numbness in arms or legs:     Sudden onset of difficulty speaking or slurred speech:    Temporary loss of vision in one eye:     Problems with dizziness:         Gastrointestinal    Blood in stool:     Vomited blood:         Genitourinary    Burning when urinating:     Blood in urine:        Psychiatric    Major depression:         Hematologic    Bleeding problems:    Problems with blood clotting too easily:        Skin    Rashes or ulcers:        Constitutional    Fever or chills:      PHYSICAL EXAM: Vitals:   04/01/22 1038  BP: 138/88  Pulse: 63  Resp: 16  Temp: 97.6 F (36.4 C)  TempSrc: Temporal  SpO2: 97%  Weight: 165 lb (74.8 kg)  Height: '6\' 1"'$  (1.854 m)    GENERAL: The patient is a well-nourished male, in no acute distress. The vital signs are documented above. CARDIAC: There is a regular rate and rhythm.  VASCULAR:  Palpable femoral pulses bilateral groins. Palpable DP bilateral lower extremities. PULMONARY: No respiratory distress. ABDOMEN: Soft and non-tender.  No pain with palpation of aneurysm. MUSCULOSKELETAL: There are no major deformities or cyanosis. NEUROLOGIC: No focal weakness or paresthesias are detected. PSYCHIATRIC: The patient has a normal affect.  DATA:  AAA duplex  today shows his aneurysm has increased from 4.9 cm to 5.0 cm over the past 6 months  Assessment/Plan:  67 year old male presents for 6 month follow-up of a 4.9 cm AAA discovered incidentally.  On duplex today his aneurysm has increased slightly from 4.9 cm to 5.0 cm.  I discussed current guidelines are to repair these at greater than 5.5 cm in men unless there is rapid growth in the aneurysm or he develops symptoms felt to be related to his aneurysm.  I will plan to see him again in 6 months with a AAA duplex here in the office.  I discussed the importance of smoking cessation again.  Discussed he call with questions or concerns.   Marty Heck, MD [facility] Office: [phone]

## 2022-04-04 ENCOUNTER — Other Ambulatory Visit: Payer: Self-pay

## 2022-04-04 DIAGNOSIS — I7143 Infrarenal abdominal aortic aneurysm, without rupture: Secondary | ICD-10-CM

## 2022-07-14 DIAGNOSIS — H9212 Otorrhea, left ear: Secondary | ICD-10-CM | POA: Diagnosis not present

## 2022-07-14 DIAGNOSIS — H7102 Cholesteatoma of attic, left ear: Secondary | ICD-10-CM | POA: Diagnosis not present

## 2022-07-14 DIAGNOSIS — H906 Mixed conductive and sensorineural hearing loss, bilateral: Secondary | ICD-10-CM | POA: Diagnosis not present

## 2022-08-05 DIAGNOSIS — H7102 Cholesteatoma of attic, left ear: Secondary | ICD-10-CM | POA: Diagnosis not present

## 2022-09-15 NOTE — Progress Notes (Deleted)
Patient name: Dale Perez MRN: [id number] DOB: 06/02/1955 Sex: male  REASON FOR CONSULT: 6 month follow-up for 5.0 cm abdominal aortic aneurysm  HPI: Dale Perez is a 67 y.o. male, with history of tobacco abuse who presents for 6 month follow-up of 5.0 cm abdominal aortic aneurysm discovered incidentally after fall.  Patient fell 17 feet out of a tree and underwent trauma work-up.  Ultimately he sustained number of rib fractures and was found to have a 4.4 cm abdominal aortic aneurysm on a CT from 04/11/2019.  He denies any prior knowledge of his aneurysm.  He has had only an appendectomy for abdominal surgery.    On follow-up today he is smoking a pack a day.  He reports no new abdominal or back pain.  No recent changes to his health.  No new concerns.  Past Medical History:  Diagnosis Date   AAA (abdominal aortic aneurysm) ([facility]) 04/2019   4.4cm    Past Surgical History:  Procedure Laterality Date   APPENDECTOMY     COLONOSCOPY  11/2008   Rourk; sigmoid diverticula, otherwise normal exam. Repeat in 2020.    COLONOSCOPY N/A 09/09/2019   Procedure: COLONOSCOPY;  Surgeon: Corbin Ade, MD;  [facility];  Service: Endoscopy;  Laterality: N/A;  9:45am   I & D EXTREMITY Left 08/10/2015   Procedure: IRRIGATION AND DEBRIDEMENT EXTREMITY;  Surgeon: Mack Hook, MD;  Location: [facility];  Service: Orthopedics;  Laterality: Left;   KNEE CARTILAGE SURGERY Right 02/2019   KNEE SURGERY     SHOULDER ARTHROSCOPY Left 08/2018   SHOULDER SURGERY     TENDON REPAIR N/A 08/10/2015   Procedure: TENDON REPAIR WITH WOUND CLOSURE;  Surgeon: Mack Hook, MD;  Location: [facility] OR;  Service: Orthopedics;  Laterality: N/A;   TONSILLECTOMY      Family History  Problem Relation Age of Onset   Heart attack Father    Colon cancer Neg Hx    Colon polyps Neg Hx     SOCIAL HISTORY: Social History   Socioeconomic History   Marital status: Married    Spouse name: Not on file   Number of  children: Not on file   Years of education: Not on file   Highest education level: Not on file  Occupational History   Not on file  Tobacco Use   Smoking status: Every Day    Current packs/day: 1.00    Average packs/day: 1 pack/day for 40.0 years (40.0 ttl pk-yrs)    Types: Cigarettes   Smokeless tobacco: Never  Vaping Use   Vaping status: Never Used  Substance and Sexual Activity   Alcohol use: Yes    Alcohol/week: 1.0 standard drink of alcohol    Types: 1 Cans of beer per week    Comment: 1-2 drinks a month.    Drug use: Never   Sexual activity: Yes  Other Topics Concern   Not on file  Social History Narrative   ** Merged History Encounter **       Social Determinants of Health   Financial Resource Strain: Not on file  Food Insecurity: Low Risk  (07/14/2022)   Received from [facility]   Food vital sign    Within the past 12 months, you worried that your food would run out before you got money to buy more: Never true    Within the past 12 months, the food you bought just didn't last and you didn't have money to get more. :  Never true  Transportation Needs: Not on file (07/14/2022)  Physical Activity: Not on file  Stress: Not on file  Social Connections: Not on file  Intimate Partner Violence: Not on file    Allergies  Allergen Reactions   Penicillins Other (See Comments)    unknown   Quinolones Other (See Comments)    Patient has an AAA Patient has an AAA   Aspirin Hives, Rash and Other (See Comments)    Childhood / able to take 81 mg Childhood / able to take 81 mg    Current Outpatient Medications  Medication Sig Dispense Refill   acetaminophen (TYLENOL) 325 MG tablet Take 1-2 tablets (325-650 mg total) by mouth every 4 (four) hours as needed for mild pain. (Patient not taking: Reported on 08/29/2019)     naproxen sodium (ALEVE) 220 MG tablet Take 440 mg by mouth daily.      No current facility-administered medications for this visit.    Facility-Administered Medications Ordered in Other Visits  Medication Dose Route Frequency Provider Last Rate Last Admin   acetaminophen (TYLENOL) tablet 650 mg  650 mg Oral Q6H Violeta Gelinas, MD   650 mg at 04/19/19 1207   HYDROmorphone (DILAUDID) injection 1 mg  1 mg Intravenous Q2H PRN Violeta Gelinas, MD   1 mg at 04/15/19 2255   metoprolol tartrate (LOPRESSOR) injection 5 mg  5 mg Intravenous Q6H PRN Berna Bue, MD   5 mg at 04/12/19 0146    REVIEW OF SYSTEMS:  [X]  denotes positive finding, [ ]  denotes negative finding Cardiac  Comments:  Chest pain or chest pressure:    Shortness of breath upon exertion:    Short of breath when lying flat:    Irregular heart rhythm:        Vascular    Pain in calf, thigh, or hip brought on by ambulation:    Pain in feet at night that wakes you up from your sleep:     Blood clot in your veins:    Leg swelling:         Pulmonary    Oxygen at home:    Productive cough:     Wheezing:         Neurologic    Sudden weakness in arms or legs:     Sudden numbness in arms or legs:     Sudden onset of difficulty speaking or slurred speech:    Temporary loss of vision in one eye:     Problems with dizziness:         Gastrointestinal    Blood in stool:     Vomited blood:         Genitourinary    Burning when urinating:     Blood in urine:        Psychiatric    Major depression:         Hematologic    Bleeding problems:    Problems with blood clotting too easily:        Skin    Rashes or ulcers:        Constitutional    Fever or chills:      PHYSICAL EXAM: There were no vitals filed for this visit.   GENERAL: The patient is a well-nourished male, in no acute distress. The vital signs are documented above. CARDIAC: There is a regular rate and rhythm.  VASCULAR:  Palpable femoral pulses bilateral groins. Palpable DP bilateral lower extremities. PULMONARY: No respiratory distress. ABDOMEN: Soft and  non-tender.  No  pain with palpation of aneurysm. MUSCULOSKELETAL: There are no major deformities or cyanosis. NEUROLOGIC: No focal weakness or paresthesias are detected. PSYCHIATRIC: The patient has a normal affect.  DATA:   AAA duplex   Assessment/Plan:  67 year old male presents for 6 month follow-up of a 5.0 cm AAA discovered incidentally.  On duplex today his aneurysm has increased slightly from 5.0 cm.  I discussed current guidelines are to repair these at greater than 5.5 cm in men unless there is rapid growth in the aneurysm or he develops symptoms felt to be related to his aneurysm.  I will plan to see him again in 6 months with a AAA duplex here in the office.  I discussed the importance of smoking cessation again.  Discussed he call with questions or concerns.   Cephus Shelling, MD [facility] Office: [phone]

## 2022-09-16 ENCOUNTER — Ambulatory Visit (HOSPITAL_COMMUNITY): Payer: Medicare Other

## 2022-09-16 ENCOUNTER — Ambulatory Visit: Payer: BC Managed Care – PPO | Admitting: Vascular Surgery

## 2022-10-23 DIAGNOSIS — H9212 Otorrhea, left ear: Secondary | ICD-10-CM | POA: Diagnosis not present

## 2022-10-23 DIAGNOSIS — H906 Mixed conductive and sensorineural hearing loss, bilateral: Secondary | ICD-10-CM | POA: Diagnosis not present

## 2022-10-23 DIAGNOSIS — H7102 Cholesteatoma of attic, left ear: Secondary | ICD-10-CM | POA: Diagnosis not present

## 2022-10-27 NOTE — Progress Notes (Unsigned)
Patient name: Dale Perez MRN: [id number] DOB: 1955/10/19 Sex: male  REASON FOR CONSULT: 6 month follow-up for 5.0 cm abdominal aortic aneurysm  HPI: Dale Perez is a 67 y.o. male, with history of tobacco abuse who presents for 6 month follow-up of 5.0 cm abdominal aortic aneurysm discovered incidentally after fall.  Patient fell 17 feet out of a tree and underwent trauma work-up.  Ultimately he sustained number of rib fractures and was found to have a 4.4 cm abdominal aortic aneurysm on a CT from 04/11/2019.  He denies any prior knowledge of his aneurysm.  He has had only an appendectomy for abdominal surgery.    On follow-up today he is smoking a pack a day.  He reports no new abdominal or back pain.  No recent changes to his health.  No new concerns.  Past Medical History:  Diagnosis Date   AAA (abdominal aortic aneurysm) (HCC) 04/2019   4.4cm    Past Surgical History:  Procedure Laterality Date   APPENDECTOMY     COLONOSCOPY  11/2008   Rourk; sigmoid diverticula, otherwise normal exam. Repeat in 2020.    COLONOSCOPY N/A 09/09/2019   Procedure: COLONOSCOPY;  Surgeon: Corbin Ade, MD;  [facility];  Service: Endoscopy;  Laterality: N/A;  9:45am   I & D EXTREMITY Left 08/10/2015   Procedure: IRRIGATION AND DEBRIDEMENT EXTREMITY;  Surgeon: Mack Hook, MD;  Location: [facility] OR;  Service: Orthopedics;  Laterality: Left;   KNEE CARTILAGE SURGERY Right 02/2019   KNEE SURGERY     SHOULDER ARTHROSCOPY Left 08/2018   SHOULDER SURGERY     TENDON REPAIR N/A 08/10/2015   Procedure: TENDON REPAIR WITH WOUND CLOSURE;  Surgeon: Mack Hook, MD;  Location: [facility] OR;  Service: Orthopedics;  Laterality: N/A;   TONSILLECTOMY      Family History  Problem Relation Age of Onset   Heart attack Father    Colon cancer Neg Hx    Colon polyps Neg Hx     SOCIAL HISTORY: Social History   Socioeconomic History   Marital status: Married    Spouse name: Not on file   Number of  children: Not on file   Years of education: Not on file   Highest education level: Not on file  Occupational History   Not on file  Tobacco Use   Smoking status: Every Day    Current packs/day: 1.00    Average packs/day: 1 pack/day for 40.0 years (40.0 ttl pk-yrs)    Types: Cigarettes   Smokeless tobacco: Never  Vaping Use   Vaping status: Never Used  Substance and Sexual Activity   Alcohol use: Yes    Alcohol/week: 1.0 standard drink of alcohol    Types: 1 Cans of beer per week    Comment: 1-2 drinks a month.    Drug use: Never   Sexual activity: Yes  Other Topics Concern   Not on file  Social History Narrative   ** Merged History Encounter **       Social Determinants of Health   Financial Resource Strain: Not on file  Food Insecurity: Low Risk  (07/14/2022)   Received from [facility]   Hunger Vital Sign    Worried About Running Out of Food in the Last Year: Never true    Ran Out of Food in the Last Year: Never true  Transportation Needs: Not on file (07/14/2022)  Physical Activity: Not on file  Stress: Not on file  Social  Connections: Not on file  Intimate Partner Violence: Not on file    Allergies  Allergen Reactions   Penicillins Other (See Comments)    unknown   Quinolones Other (See Comments)    Patient has an AAA Patient has an AAA   Aspirin Hives, Rash and Other (See Comments)    Childhood / able to take 81 mg Childhood / able to take 81 mg    Current Outpatient Medications  Medication Sig Dispense Refill   acetaminophen (TYLENOL) 325 MG tablet Take 1-2 tablets (325-650 mg total) by mouth every 4 (four) hours as needed for mild pain. (Patient not taking: Reported on 08/29/2019)     naproxen sodium (ALEVE) 220 MG tablet Take 440 mg by mouth daily.      No current facility-administered medications for this visit.   Facility-Administered Medications Ordered in Other Visits  Medication Dose Route Frequency Provider Last Rate Last Admin    acetaminophen (TYLENOL) tablet 650 mg  650 mg Oral Q6H Violeta Gelinas, MD   650 mg at 04/19/19 1207   HYDROmorphone (DILAUDID) injection 1 mg  1 mg Intravenous Q2H PRN Violeta Gelinas, MD   1 mg at 04/15/19 2255   metoprolol tartrate (LOPRESSOR) injection 5 mg  5 mg Intravenous Q6H PRN Berna Bue, MD   5 mg at 04/12/19 0146    REVIEW OF SYSTEMS:  [X]  denotes positive finding, [ ]  denotes negative finding Cardiac  Comments:  Chest pain or chest pressure:    Shortness of breath upon exertion:    Short of breath when lying flat:    Irregular heart rhythm:        Vascular    Pain in calf, thigh, or hip brought on by ambulation:    Pain in feet at night that wakes you up from your sleep:     Blood clot in your veins:    Leg swelling:         Pulmonary    Oxygen at home:    Productive cough:     Wheezing:         Neurologic    Sudden weakness in arms or legs:     Sudden numbness in arms or legs:     Sudden onset of difficulty speaking or slurred speech:    Temporary loss of vision in one eye:     Problems with dizziness:         Gastrointestinal    Blood in stool:     Vomited blood:         Genitourinary    Burning when urinating:     Blood in urine:        Psychiatric    Major depression:         Hematologic    Bleeding problems:    Problems with blood clotting too easily:        Skin    Rashes or ulcers:        Constitutional    Fever or chills:      PHYSICAL EXAM: There were no vitals filed for this visit.   GENERAL: The patient is a well-nourished male, in no acute distress. The vital signs are documented above. CARDIAC: There is a regular rate and rhythm.  VASCULAR:  Palpable femoral pulses bilateral groins. Palpable DP bilateral lower extremities. PULMONARY: No respiratory distress. ABDOMEN: Soft and non-tender.  No pain with palpation of aneurysm. MUSCULOSKELETAL: There are no major deformities or cyanosis. NEUROLOGIC: No focal weakness or  paresthesias are detected. PSYCHIATRIC: The patient has a normal affect.  DATA:   AAA duplex today shows his aneurysm has increased from 5.0 cm --> over the past 6 months  Assessment/Plan:  68 year old male presents for 6 month follow-up of a 5.0 cm AAA discovered incidentally.      I discussed current guidelines are to repair these at greater than 5.5 cm in men unless there is rapid growth in the aneurysm or he develops symptoms felt to be related to his aneurysm.  I will plan to see him again in 6 months with a AAA duplex here in the office.  I discussed the importance of smoking cessation again.  Discussed he call with questions or concerns.   Cephus Shelling, MD [facility] Office: [phone]

## 2022-10-28 ENCOUNTER — Ambulatory Visit (INDEPENDENT_AMBULATORY_CARE_PROVIDER_SITE_OTHER): Payer: Medicare Other | Admitting: Vascular Surgery

## 2022-10-28 ENCOUNTER — Encounter: Payer: Self-pay | Admitting: Vascular Surgery

## 2022-10-28 ENCOUNTER — Ambulatory Visit (HOSPITAL_COMMUNITY)
Admission: RE | Admit: 2022-10-28 | Discharge: 2022-10-28 | Disposition: A | Discharge status: Home / Self Care | Payer: Medicare Other | Source: Ambulatory Visit | Attending: Vascular Surgery | Admitting: Vascular Surgery

## 2022-10-28 VITALS — BP 160/87 | HR 63 | Temp 97.5°F | Ht 73.0 in | Wt 169.0 lb

## 2022-10-28 DIAGNOSIS — I7143 Infrarenal abdominal aortic aneurysm, without rupture: Secondary | ICD-10-CM

## 2022-11-06 DIAGNOSIS — H7102 Cholesteatoma of attic, left ear: Secondary | ICD-10-CM | POA: Diagnosis not present

## 2022-11-06 DIAGNOSIS — H906 Mixed conductive and sensorineural hearing loss, bilateral: Secondary | ICD-10-CM | POA: Diagnosis not present

## 2022-11-06 DIAGNOSIS — H9212 Otorrhea, left ear: Secondary | ICD-10-CM | POA: Diagnosis not present

## 2022-11-10 ENCOUNTER — Other Ambulatory Visit: Payer: Self-pay

## 2022-11-10 DIAGNOSIS — I7143 Infrarenal abdominal aortic aneurysm, without rupture: Secondary | ICD-10-CM

## 2022-11-20 DIAGNOSIS — H7102 Cholesteatoma of attic, left ear: Secondary | ICD-10-CM | POA: Diagnosis not present

## 2022-11-24 ENCOUNTER — Encounter (INDEPENDENT_AMBULATORY_CARE_PROVIDER_SITE_OTHER): Payer: Self-pay | Admitting: Otolaryngology

## 2022-12-02 ENCOUNTER — Encounter (INDEPENDENT_AMBULATORY_CARE_PROVIDER_SITE_OTHER): Payer: Self-pay | Admitting: Otolaryngology

## 2023-04-20 NOTE — Progress Notes (Unsigned)
 [id number]    Patient name: Dale Perez MRN: [id number] DOB: 1955-12-18 Sex: male  REASON FOR CONSULT: 6 month follow-up for 5.0 cm abdominal aortic aneurysm  HPI: Dale Perez is a 68 y.o. male, with history of tobacco abuse who presents for 6 month follow-up of 5.0 cm abdominal aortic aneurysm discovered incidentally after fall.  Patient fell 17 feet out of a tree and underwent trauma work-up.  Ultimately he sustained number of rib fractures and was found to have a 4.4 cm abdominal aortic aneurysm on a CT from 04/11/2019.  He denies any prior knowledge of his aneurysm.  He has had only an appendectomy for abdominal surgery.    On follow-up today he is still smoking a pack a day.  He reports no new abdominal or back pain.  No recent changes to his health.  Complains of hip pain.  Past Medical History:  Diagnosis Date   AAA (abdominal aortic aneurysm) (HCC) 04/2019   4.4cm    Past Surgical History:  Procedure Laterality Date   APPENDECTOMY     COLONOSCOPY  11/2008   Rourk; sigmoid diverticula, otherwise normal exam. Repeat in 2020.    COLONOSCOPY N/A 09/09/2019   Procedure: COLONOSCOPY;  Surgeon: Corbin Ade, MD;  [facility];  Service: Endoscopy;  Laterality: N/A;  9:45am   I & D EXTREMITY Left 08/10/2015   Procedure: IRRIGATION AND DEBRIDEMENT EXTREMITY;  Surgeon: Mack Hook, MD;  Location: [facility] OR;  Service: Orthopedics;  Laterality: Left;   KNEE CARTILAGE SURGERY Right 02/2019   KNEE SURGERY     SHOULDER ARTHROSCOPY Left 08/2018   SHOULDER SURGERY     TENDON REPAIR N/A 08/10/2015   Procedure: TENDON REPAIR WITH WOUND CLOSURE;  Surgeon: Mack Hook, MD;  Location: [facility] OR;  Service: Orthopedics;  Laterality: N/A;   TONSILLECTOMY      Family History  Problem Relation Age of Onset   Heart attack Father    Colon cancer Neg Hx    Colon polyps Neg Hx     SOCIAL HISTORY: Social History   Socioeconomic History   Marital status: Married    Spouse name: Not on  file   Number of children: Not on file   Years of education: Not on file   Highest education level: Not on file  Occupational History   Not on file  Tobacco Use   Smoking status: Every Day    Current packs/day: 1.00    Average packs/day: 1 pack/day for 40.0 years (40.0 ttl pk-yrs)    Types: Cigarettes   Smokeless tobacco: Never  Vaping Use   Vaping status: Never Used  Substance and Sexual Activity   Alcohol use: Yes    Alcohol/week: 1.0 standard drink of alcohol    Types: 1 Cans of beer per week    Comment: 1-2 drinks a month.    Drug use: Never   Sexual activity: Yes  Other Topics Concern   Not on file  Social History Narrative   ** Merged History Encounter **       Social Drivers of Health   Financial Resource Strain: Not on file  Food Insecurity: Low Risk  (07/14/2022)   Received from [facility]   Hunger Vital Sign    Worried About Running Out of Food in the Last Year: Never true    Ran Out of Food in the Last Year: Never true  Transportation Needs: Not on file (07/14/2022)  Physical Activity: Not on file  Stress: Not  on file  Social Connections: Not on file  Intimate Partner Violence: Not on file    Allergies  Allergen Reactions   Penicillins Other (See Comments)    unknown   Quinolones Other (See Comments)    Patient has an AAA Patient has an AAA   Aspirin Hives, Rash and Other (See Comments)    Childhood / able to take 81 mg Childhood / able to take 81 mg    Current Outpatient Medications  Medication Sig Dispense Refill   naproxen sodium (ALEVE) 220 MG tablet Take 440 mg by mouth daily.      No current facility-administered medications for this visit.   Facility-Administered Medications Ordered in Other Visits  Medication Dose Route Frequency Provider Last Rate Last Admin   acetaminophen (TYLENOL) tablet 650 mg  650 mg Oral Q6H Violeta Gelinas, MD   650 mg at 04/19/19 1207   HYDROmorphone (DILAUDID) injection 1 mg  1 mg Intravenous Q2H PRN  Violeta Gelinas, MD   1 mg at 04/15/19 2255   metoprolol tartrate (LOPRESSOR) injection 5 mg  5 mg Intravenous Q6H PRN Berna Bue, MD   5 mg at 04/12/19 0146    REVIEW OF SYSTEMS:  [X]  denotes positive finding, [ ]  denotes negative finding Cardiac  Comments:  Chest pain or chest pressure:    Shortness of breath upon exertion:    Short of breath when lying flat:    Irregular heart rhythm:        Vascular    Pain in calf, thigh, or hip brought on by ambulation:    Pain in feet at night that wakes you up from your sleep:     Blood clot in your veins:    Leg swelling:         Pulmonary    Oxygen at home:    Productive cough:     Wheezing:         Neurologic    Sudden weakness in arms or legs:     Sudden numbness in arms or legs:     Sudden onset of difficulty speaking or slurred speech:    Temporary loss of vision in one eye:     Problems with dizziness:         Gastrointestinal    Blood in stool:     Vomited blood:         Genitourinary    Burning when urinating:     Blood in urine:        Psychiatric    Major depression:         Hematologic    Bleeding problems:    Problems with blood clotting too easily:        Skin    Rashes or ulcers:        Constitutional    Fever or chills:      PHYSICAL EXAM: There were no vitals filed for this visit.   GENERAL: The patient is a well-nourished male, in no acute distress. The vital signs are documented above. CARDIAC: There is a regular rate and rhythm.  VASCULAR:  Palpable femoral pulses bilateral groins. Palpable PT pulses bilateral lower extremities. PULMONARY: No respiratory distress. ABDOMEN: Soft and non-tender.  No pain with palpation of aneurysm. MUSCULOSKELETAL: There are no major deformities or cyanosis. NEUROLOGIC: No focal weakness or paresthesias are detected. PSYCHIATRIC: The patient has a normal affect.  DATA:   AAA duplex today shows his aneurysm has increased 5.04 cm ---> over the past  6  months  Assessment/Plan:  68 year old male presents for 6 month follow-up of a 5.0 cm AAA discovered incidentally.  Discussed over the last 6 months the aneurysm now measures    .  I again discussed current guidelines for repair are greater than 5.5 cm in men unless there is rapid growth or felt to be symptoms related to his aneurysm.  He does not meet criteria for repair at this time.  I will see him again in 6 months with repeat AAA duplex.  I again enforced the importance of smoking cessation.    Cephus Shelling, MD [facility] [place] Office: [phone]

## 2023-04-21 ENCOUNTER — Encounter: Payer: Self-pay | Admitting: Vascular Surgery

## 2023-04-21 ENCOUNTER — Ambulatory Visit (HOSPITAL_COMMUNITY)
Admission: RE | Admit: 2023-04-21 | Discharge: 2023-04-21 | Disposition: A | Discharge status: Home / Self Care | Payer: Medicare Other | Source: Ambulatory Visit | Attending: Vascular Surgery | Admitting: Vascular Surgery

## 2023-04-21 ENCOUNTER — Ambulatory Visit (INDEPENDENT_AMBULATORY_CARE_PROVIDER_SITE_OTHER): Payer: Medicare Other | Admitting: Vascular Surgery

## 2023-04-21 VITALS — BP 182/90 | HR 56 | Temp 98.0°F | Resp 20 | Ht 73.0 in | Wt 173.3 lb

## 2023-04-21 DIAGNOSIS — I7143 Infrarenal abdominal aortic aneurysm, without rupture: Secondary | ICD-10-CM | POA: Insufficient documentation

## 2023-04-22 ENCOUNTER — Other Ambulatory Visit: Payer: Self-pay

## 2023-04-22 DIAGNOSIS — I7143 Infrarenal abdominal aortic aneurysm, without rupture: Secondary | ICD-10-CM

## 2023-05-01 NOTE — H&P (Signed)
 Dale Perez is an 68 y.o. male.   Chief Complaint: Draining ear HPI: History of left mastoidectomy with continuous drainage from the ear.  Past Medical History:  Diagnosis Date   AAA (abdominal aortic aneurysm) (HCC) 04/2019   4.4cm    Past Surgical History:  Procedure Laterality Date   APPENDECTOMY     COLONOSCOPY  11/2008   Rourk; sigmoid diverticula, otherwise normal exam. Repeat in 2020.    COLONOSCOPY N/A 09/09/2019   Procedure: COLONOSCOPY;  Surgeon: Corbin Ade, MD;  [facility];  Service: Endoscopy;  Laterality: N/A;  9:45am   I & D EXTREMITY Left 08/10/2015   Procedure: IRRIGATION AND DEBRIDEMENT EXTREMITY;  Surgeon: Mack Hook, MD;  Location: [facility] OR;  Service: Orthopedics;  Laterality: Left;   KNEE CARTILAGE SURGERY Right 02/2019   KNEE SURGERY     SHOULDER ARTHROSCOPY Left 08/2018   SHOULDER SURGERY     TENDON REPAIR N/A 08/10/2015   Procedure: TENDON REPAIR WITH WOUND CLOSURE;  Surgeon: Mack Hook, MD;  Location: [facility] OR;  Service: Orthopedics;  Laterality: N/A;   TONSILLECTOMY      Family History  Problem Relation Age of Onset   Heart attack Father    Colon cancer Neg Hx    Colon polyps Neg Hx    Social History:  reports that he has been smoking cigarettes. He has a 40 pack-year smoking history. He has never used smokeless tobacco. He reports current alcohol use of about 1.0 standard drink of alcohol per week. He reports that he does not use drugs.  Allergies:  Allergies  Allergen Reactions   Penicillins Other (See Comments)    unknown   Quinolones Other (See Comments)    Patient has an AAA Patient has an AAA   Aspirin Hives, Rash and Other (See Comments)    Childhood / able to take 81 mg Childhood / able to take 81 mg    No medications prior to admission.    No results found for this or any previous visit (from the past 48 hours). No results found.  ROS: otherwise negative  There were no vitals taken for this  visit.  PHYSICAL EXAM: Overall appearance:  Healthy appearing, in no distress Head:  Normocephalic, atraumatic. Ears: Right ear healthy and clear.  Left mastoid bowl with chronic inflammation and infection debris buildup and narrowing of the external meatus. Nose: External nose is healthy in appearance. Internal nasal exam free of any lesions or obstruction. Oral Cavity/pharynx:  There are no mucosal lesions or masses identified. Hypopharynx/Larynx: no signs of any mucosal lesions or masses identified. Vocal cords move normally. Neuro:  No identifiable neurologic deficits. Neck: No palpable neck masses.  Studies Reviewed: none    Assessment/Plan Chronic mastoid bowl infection.  Recommend surgical meatoplasty.  Serena Colonel 05/01/2023, 11:11 AM

## 2023-05-05 DIAGNOSIS — I639 Cerebral infarction, unspecified: Secondary | ICD-10-CM

## 2023-05-05 HISTORY — DX: Cerebral infarction, unspecified: I63.9

## 2023-05-11 ENCOUNTER — Other Ambulatory Visit (HOSPITAL_COMMUNITY): Payer: PRIVATE HEALTH INSURANCE

## 2023-05-11 ENCOUNTER — Ambulatory Visit (HOSPITAL_BASED_OUTPATIENT_CLINIC_OR_DEPARTMENT_OTHER): Admit: 2023-05-11 | Payer: PRIVATE HEALTH INSURANCE | Admitting: Otolaryngology

## 2023-05-11 ENCOUNTER — Encounter (HOSPITAL_BASED_OUTPATIENT_CLINIC_OR_DEPARTMENT_OTHER): Payer: Self-pay

## 2023-05-11 SURGERY — Surgical Case
Anesthesia: *Unknown

## 2023-05-11 SURGERY — EAR MEATOPLASTY WITH FULL THICKNESS SKIN GRAFT
Anesthesia: General | Laterality: Left

## 2023-05-19 ENCOUNTER — Ambulatory Visit
Admission: RE | Admit: 2023-05-19 | Discharge: 2023-05-19 | Disposition: A | Discharge status: Home / Self Care | Payer: PRIVATE HEALTH INSURANCE | Source: Ambulatory Visit | Attending: Vascular Surgery | Admitting: Vascular Surgery

## 2023-05-19 DIAGNOSIS — I7143 Infrarenal abdominal aortic aneurysm, without rupture: Secondary | ICD-10-CM

## 2023-05-19 MED ORDER — IOPAMIDOL (ISOVUE-370) INJECTION 76%
100.0000 mL | Freq: Once | INTRAVENOUS | Status: AC | PRN
  Administered 2023-05-19: 100 mL via INTRAVENOUS

## 2023-05-21 ENCOUNTER — Other Ambulatory Visit: Payer: Self-pay

## 2023-05-21 ENCOUNTER — Emergency Department (HOSPITAL_COMMUNITY)

## 2023-05-21 ENCOUNTER — Inpatient Hospital Stay (HOSPITAL_COMMUNITY)

## 2023-05-21 ENCOUNTER — Encounter (HOSPITAL_COMMUNITY)
Admission: EM | Disposition: A | Discharge status: Home / Self Care | Payer: Self-pay | Source: Home / Self Care | Attending: Neurology

## 2023-05-21 ENCOUNTER — Inpatient Hospital Stay (HOSPITAL_COMMUNITY)
Admission: EM | Admit: 2023-05-21 | Discharge: 2023-05-23 | DRG: 024 | Disposition: A | Discharge status: Home / Self Care | Attending: Neurology | Admitting: Neurology

## 2023-05-21 ENCOUNTER — Encounter (HOSPITAL_COMMUNITY): Payer: Self-pay | Admitting: *Deleted

## 2023-05-21 DIAGNOSIS — F1090 Alcohol use, unspecified, uncomplicated: Secondary | ICD-10-CM | POA: Diagnosis not present

## 2023-05-21 DIAGNOSIS — R29707 NIHSS score 7: Secondary | ICD-10-CM | POA: Diagnosis present

## 2023-05-21 DIAGNOSIS — R29810 Facial weakness: Secondary | ICD-10-CM | POA: Diagnosis present

## 2023-05-21 DIAGNOSIS — Z79899 Other long term (current) drug therapy: Secondary | ICD-10-CM

## 2023-05-21 DIAGNOSIS — F17200 Nicotine dependence, unspecified, uncomplicated: Secondary | ICD-10-CM | POA: Insufficient documentation

## 2023-05-21 DIAGNOSIS — F17210 Nicotine dependence, cigarettes, uncomplicated: Secondary | ICD-10-CM | POA: Diagnosis not present

## 2023-05-21 DIAGNOSIS — Z791 Long term (current) use of non-steroidal anti-inflammatories (NSAID): Secondary | ICD-10-CM | POA: Diagnosis not present

## 2023-05-21 DIAGNOSIS — Z716 Tobacco abuse counseling: Secondary | ICD-10-CM

## 2023-05-21 DIAGNOSIS — I714 Abdominal aortic aneurysm, without rupture, unspecified: Secondary | ICD-10-CM | POA: Diagnosis present

## 2023-05-21 DIAGNOSIS — Z880 Allergy status to penicillin: Secondary | ICD-10-CM

## 2023-05-21 DIAGNOSIS — I639 Cerebral infarction, unspecified: Principal | ICD-10-CM

## 2023-05-21 DIAGNOSIS — R4701 Aphasia: Secondary | ICD-10-CM | POA: Diagnosis not present

## 2023-05-21 DIAGNOSIS — R29703 NIHSS score 3: Secondary | ICD-10-CM | POA: Diagnosis not present

## 2023-05-21 DIAGNOSIS — Z886 Allergy status to analgesic agent status: Secondary | ICD-10-CM | POA: Diagnosis not present

## 2023-05-21 DIAGNOSIS — I63032 Cerebral infarction due to thrombosis of left carotid artery: Principal | ICD-10-CM | POA: Diagnosis present

## 2023-05-21 DIAGNOSIS — I10 Essential (primary) hypertension: Secondary | ICD-10-CM | POA: Diagnosis not present

## 2023-05-21 DIAGNOSIS — I63232 Cerebral infarction due to unspecified occlusion or stenosis of left carotid arteries: Secondary | ICD-10-CM | POA: Diagnosis not present

## 2023-05-21 DIAGNOSIS — I6522 Occlusion and stenosis of left carotid artery: Secondary | ICD-10-CM

## 2023-05-21 DIAGNOSIS — Z8249 Family history of ischemic heart disease and other diseases of the circulatory system: Secondary | ICD-10-CM

## 2023-05-21 DIAGNOSIS — I63231 Cerebral infarction due to unspecified occlusion or stenosis of right carotid arteries: Secondary | ICD-10-CM | POA: Diagnosis present

## 2023-05-21 DIAGNOSIS — G8191 Hemiplegia, unspecified affecting right dominant side: Secondary | ICD-10-CM | POA: Diagnosis not present

## 2023-05-21 DIAGNOSIS — I6389 Other cerebral infarction: Secondary | ICD-10-CM | POA: Diagnosis not present

## 2023-05-21 DIAGNOSIS — Z888 Allergy status to other drugs, medicaments and biological substances status: Secondary | ICD-10-CM | POA: Diagnosis not present

## 2023-05-21 DIAGNOSIS — R471 Dysarthria and anarthria: Secondary | ICD-10-CM | POA: Diagnosis present

## 2023-05-21 DIAGNOSIS — E785 Hyperlipidemia, unspecified: Secondary | ICD-10-CM | POA: Diagnosis not present

## 2023-05-21 DIAGNOSIS — F1310 Sedative, hypnotic or anxiolytic abuse, uncomplicated: Secondary | ICD-10-CM | POA: Diagnosis present

## 2023-05-21 HISTORY — PX: IR PERCUTANEOUS ART THROMBECTOMY/INFUSION INTRACRANIAL INC DIAG ANGIO: IMG6087

## 2023-05-21 HISTORY — PX: RADIOLOGY WITH ANESTHESIA: SHX6223

## 2023-05-21 HISTORY — PX: IR CT HEAD LTD: IMG2386

## 2023-05-21 HISTORY — PX: IR INTRAVSC STENT CERV CAROTID W/O EMB-PROT MOD SED INC ANGIO: IMG2304

## 2023-05-21 LAB — CBC
HCT: 51.1 % (ref 39.0–52.0)
Hemoglobin: 16.5 g/dL (ref 13.0–17.0)
MCH: 29 pg (ref 26.0–34.0)
MCHC: 32.3 g/dL (ref 30.0–36.0)
MCV: 89.8 fL (ref 80.0–100.0)
Platelets: 242 10*3/uL (ref 150–400)
RBC: 5.69 MIL/uL (ref 4.22–5.81)
RDW: 14.5 % (ref 11.5–15.5)
WBC: 8.8 10*3/uL (ref 4.0–10.5)
nRBC: 0 % (ref 0.0–0.2)

## 2023-05-21 LAB — COMPREHENSIVE METABOLIC PANEL WITH GFR
ALT: 21 U/L (ref 0–44)
AST: 18 U/L (ref 15–41)
Albumin: 3.2 g/dL — ABNORMAL LOW (ref 3.5–5.0)
Alkaline Phosphatase: 89 U/L (ref 38–126)
Anion gap: 8 (ref 5–15)
BUN: 20 mg/dL (ref 8–23)
CO2: 23 mmol/L (ref 22–32)
Calcium: 8.2 mg/dL — ABNORMAL LOW (ref 8.9–10.3)
Chloride: 103 mmol/L (ref 98–111)
Creatinine, Ser: 0.91 mg/dL (ref 0.61–1.24)
GFR, Estimated: >60 mL/min (ref >60)
Glucose, Bld: 105 mg/dL — ABNORMAL HIGH (ref 70–99)
Potassium: 4.2 mmol/L (ref 3.5–5.1)
Sodium: 134 mmol/L — ABNORMAL LOW (ref 135–145)
Total Bilirubin: 0.4 mg/dL (ref 0.0–1.2)
Total Protein: 5.8 g/dL — ABNORMAL LOW (ref 6.5–8.1)

## 2023-05-21 LAB — DIFFERENTIAL
Abs Immature Granulocytes: 0.06 10*3/uL (ref 0.00–0.07)
Basophils Absolute: 0 10*3/uL (ref 0.0–0.1)
Basophils Relative: 0 %
Eosinophils Absolute: 0.5 10*3/uL (ref 0.0–0.5)
Eosinophils Relative: 5 %
Immature Granulocytes: 1 %
Lymphocytes Relative: 17 %
Lymphs Abs: 1.5 10*3/uL (ref 0.7–4.0)
Monocytes Absolute: 0.5 10*3/uL (ref 0.1–1.0)
Monocytes Relative: 5 %
Neutro Abs: 6.1 10*3/uL (ref 1.7–7.7)
Neutrophils Relative %: 72 %

## 2023-05-21 LAB — I-STAT CHEM 8, ED
BUN: 21 mg/dL (ref 8–23)
Calcium, Ion: 1.06 mmol/L — ABNORMAL LOW (ref 1.15–1.40)
Chloride: 104 mmol/L (ref 98–111)
Creatinine, Ser: 1 mg/dL (ref 0.61–1.24)
Glucose, Bld: 100 mg/dL — ABNORMAL HIGH (ref 70–99)
HCT: 50 % (ref 39.0–52.0)
Hemoglobin: 17 g/dL (ref 13.0–17.0)
Potassium: 4.6 mmol/L (ref 3.5–5.1)
Sodium: 139 mmol/L (ref 135–145)
TCO2: 25 mmol/L (ref 22–32)

## 2023-05-21 LAB — PROTIME-INR
INR: 1.1 (ref 0.8–1.2)
Prothrombin Time: 13.9 s (ref 11.4–15.2)

## 2023-05-21 LAB — ETHANOL: Alcohol, Ethyl (B): <10 mg/dL (ref <10)

## 2023-05-21 LAB — MRSA NEXT GEN BY PCR, NASAL: MRSA by PCR Next Gen: NOT DETECTED

## 2023-05-21 LAB — CBG MONITORING, ED: Glucose-Capillary: 111 mg/dL — ABNORMAL HIGH (ref 70–99)

## 2023-05-21 LAB — APTT: aPTT: 28 s (ref 24–36)

## 2023-05-21 LAB — SARS CORONAVIRUS 2 BY RT PCR: SARS Coronavirus 2 by RT PCR: NEGATIVE

## 2023-05-21 SURGERY — RADIOLOGY WITH ANESTHESIA
Anesthesia: General

## 2023-05-21 MED ORDER — TICAGRELOR 90 MG PO TABS
90.0000 mg | ORAL_TABLET | Freq: Two times a day (BID) | ORAL | Status: DC
  Filled 2023-05-21 (×3): qty 1

## 2023-05-21 MED ORDER — PHENYLEPHRINE 80 MCG/ML (10ML) SYRINGE FOR IV PUSH (FOR BLOOD PRESSURE SUPPORT)
PREFILLED_SYRINGE | INTRAVENOUS | Status: DC | PRN
  Administered 2023-05-21 (×2): 80 ug via INTRAVENOUS
  Administered 2023-05-21: 160 ug via INTRAVENOUS

## 2023-05-21 MED ORDER — CANGRELOR BOLUS VIA INFUSION
INTRAVENOUS | Status: AC | PRN
  Administered 2023-05-21: 1201.5 ug via INTRAVENOUS

## 2023-05-21 MED ORDER — MIDAZOLAM HCL 2 MG/2ML IJ SOLN
INTRAMUSCULAR | Status: DC | PRN
  Administered 2023-05-21: 2 mg via INTRAVENOUS

## 2023-05-21 MED ORDER — ACETAMINOPHEN 325 MG PO TABS: 650.0000 mg | ORAL_TABLET | ORAL | Status: DC | PRN

## 2023-05-21 MED ORDER — MIDAZOLAM HCL 2 MG/2ML IJ SOLN
INTRAMUSCULAR | Status: AC
  Filled 2023-05-21: qty 2

## 2023-05-21 MED ORDER — ROCURONIUM BROMIDE 10 MG/ML (PF) SYRINGE
PREFILLED_SYRINGE | INTRAVENOUS | Status: DC | PRN
  Administered 2023-05-21: 20 mg via INTRAVENOUS
  Administered 2023-05-21: 10 mg via INTRAVENOUS
  Administered 2023-05-21: 50 mg via INTRAVENOUS

## 2023-05-21 MED ORDER — TICAGRELOR 90 MG PO TABS
90.0000 mg | ORAL_TABLET | Freq: Two times a day (BID) | ORAL | Status: DC
  Administered 2023-05-21 – 2023-05-23 (×4): 90 mg via ORAL
  Filled 2023-05-21 (×5): qty 1

## 2023-05-21 MED ORDER — ONDANSETRON HCL 4 MG/2ML IJ SOLN
INTRAMUSCULAR | Status: DC | PRN
  Administered 2023-05-21: 4 mg via INTRAVENOUS

## 2023-05-21 MED ORDER — DIPHENHYDRAMINE HCL 25 MG PO CAPS: 50.0000 mg | ORAL_CAPSULE | Freq: Once | ORAL | Status: DC

## 2023-05-21 MED ORDER — STROKE: EARLY STAGES OF RECOVERY BOOK
Freq: Once | Status: AC
  Filled 2023-05-21 (×2): qty 1

## 2023-05-21 MED ORDER — NITROGLYCERIN 1 MG/10 ML FOR IR/CATH LAB
INTRA_ARTERIAL | Status: AC | PRN
  Administered 2023-05-21 (×2): 25 ug via INTRA_ARTERIAL

## 2023-05-21 MED ORDER — ACETAMINOPHEN 650 MG RE SUPP: 650.0000 mg | RECTAL | Status: DC | PRN

## 2023-05-21 MED ORDER — SODIUM CHLORIDE 0.9 % IV SOLN
INTRAVENOUS | Status: AC | PRN
  Administered 2023-05-21: 2 ug/kg/min via INTRAVENOUS

## 2023-05-21 MED ORDER — SUCCINYLCHOLINE CHLORIDE 200 MG/10ML IV SOSY
PREFILLED_SYRINGE | INTRAVENOUS | Status: DC | PRN
Start: 2023-05-21 — End: 2023-05-21
  Administered 2023-05-21: 140 mg via INTRAVENOUS

## 2023-05-21 MED ORDER — IOHEXOL 300 MG/ML  SOLN
150.0000 mL | Freq: Once | INTRAMUSCULAR | Status: AC | PRN
  Administered 2023-05-21: 100 mL via INTRA_ARTERIAL

## 2023-05-21 MED ORDER — SODIUM CHLORIDE 0.9 % IV SOLN: INTRAVENOUS | Status: DC

## 2023-05-21 MED ORDER — PROPOFOL 10 MG/ML IV BOLUS
INTRAVENOUS | Status: DC | PRN
  Administered 2023-05-21: 130 mg via INTRAVENOUS
  Administered 2023-05-21: 20 mg via INTRAVENOUS

## 2023-05-21 MED ORDER — DIPHENHYDRAMINE HCL 50 MG/ML IJ SOLN: 50.0000 mg | Freq: Once | INTRAMUSCULAR | Status: DC

## 2023-05-21 MED ORDER — PHENYLEPHRINE HCL-NACL 20-0.9 MG/250ML-% IV SOLN
INTRAVENOUS | Status: DC | PRN
Start: 2023-05-21 — End: 2023-05-21
  Administered 2023-05-21: 15 ug/min via INTRAVENOUS

## 2023-05-21 MED ORDER — IOHEXOL 300 MG/ML  SOLN
150.0000 mL | Freq: Once | INTRAMUSCULAR | Status: AC | PRN
  Administered 2023-05-21: 25 mL via INTRA_ARTERIAL

## 2023-05-21 MED ORDER — SUGAMMADEX SODIUM 200 MG/2ML IV SOLN
INTRAVENOUS | Status: DC | PRN
  Administered 2023-05-21: 200 mg via INTRAVENOUS

## 2023-05-21 MED ORDER — EPHEDRINE SULFATE-NACL 50-0.9 MG/10ML-% IV SOSY
PREFILLED_SYRINGE | INTRAVENOUS | Status: DC | PRN
  Administered 2023-05-21 (×2): 5 mg via INTRAVENOUS
  Administered 2023-05-21: 10 mg via INTRAVENOUS

## 2023-05-21 MED ORDER — TICAGRELOR 90 MG PO TABS
180.0000 mg | ORAL_TABLET | Freq: Once | ORAL | Status: AC
  Administered 2023-05-21: 180 mg via ORAL
  Filled 2023-05-21: qty 2

## 2023-05-21 MED ORDER — ASPIRIN 81 MG PO CHEW
81.0000 mg | CHEWABLE_TABLET | Freq: Every day | ORAL | Status: DC
  Filled 2023-05-21: qty 1

## 2023-05-21 MED ORDER — SODIUM CHLORIDE 0.9 % IV SOLN: INTRAVENOUS | Status: DC | PRN

## 2023-05-21 MED ORDER — CHLORHEXIDINE GLUCONATE CLOTH 2 % EX PADS
6.0000 | MEDICATED_PAD | Freq: Every day | CUTANEOUS | Status: DC
  Administered 2023-05-22 – 2023-05-23 (×2): 6 via TOPICAL

## 2023-05-21 MED ORDER — IOHEXOL 350 MG/ML SOLN
100.0000 mL | Freq: Once | INTRAVENOUS | Status: AC | PRN
  Administered 2023-05-21: 100 mL via INTRAVENOUS

## 2023-05-21 MED ORDER — DEXAMETHASONE SODIUM PHOSPHATE 10 MG/ML IJ SOLN
INTRAMUSCULAR | Status: DC | PRN
Start: 2023-05-21 — End: 2023-05-21
  Administered 2023-05-21: 10 mg via INTRAVENOUS

## 2023-05-21 MED ORDER — NITROGLYCERIN 1 MG/10 ML FOR IR/CATH LAB
INTRA_ARTERIAL | Status: AC
  Filled 2023-05-21: qty 10

## 2023-05-21 MED ORDER — FENTANYL CITRATE (PF) 100 MCG/2ML IJ SOLN
INTRAMUSCULAR | Status: AC
  Filled 2023-05-21: qty 2

## 2023-05-21 MED ORDER — LIDOCAINE 2% (20 MG/ML) 5 ML SYRINGE
INTRAMUSCULAR | Status: DC | PRN
  Administered 2023-05-21: 60 mg via INTRAVENOUS

## 2023-05-21 MED ORDER — TENECTEPLASE FOR STROKE
PACK | INTRAVENOUS | Status: AC
  Filled 2023-05-21: qty 10

## 2023-05-21 MED ORDER — FENTANYL CITRATE (PF) 250 MCG/5ML IJ SOLN
INTRAMUSCULAR | Status: DC | PRN
  Administered 2023-05-21: 100 ug via INTRAVENOUS

## 2023-05-21 MED ORDER — CLEVIDIPINE BUTYRATE 0.5 MG/ML IV EMUL: 0.0000 mg/h | INTRAVENOUS | Status: AC

## 2023-05-21 MED ORDER — ACETAMINOPHEN 160 MG/5ML PO SOLN: 650.0000 mg | ORAL | Status: DC | PRN

## 2023-05-21 MED ORDER — ASPIRIN 81 MG PO CHEW
81.0000 mg | CHEWABLE_TABLET | Freq: Every day | ORAL | Status: DC
  Administered 2023-05-22 – 2023-05-23 (×2): 81 mg via ORAL
  Filled 2023-05-21: qty 1

## 2023-05-21 MED ORDER — CANGRELOR TETRASODIUM 50 MG IV SOLR
INTRAVENOUS | Status: AC
  Filled 2023-05-21: qty 50

## 2023-05-21 MED ORDER — METHYLPREDNISOLONE SODIUM SUCC 40 MG IJ SOLR: 40.0000 mg | Freq: Once | INTRAMUSCULAR | Status: DC

## 2023-05-21 NOTE — Transfer of Care (Signed)
 Immediate Anesthesia Transfer of Care Note  Patient: Dale Perez  Procedure(s) Performed: RADIOLOGY WITH ANESTHESIA  Patient Location: PACU  Anesthesia Type:General  Level of Consciousness: awake, alert , and oriented  Airway & Oxygen Therapy: Patient Spontanous Breathing  Post-op Assessment: Report given to RN and Post -op Vital signs reviewed and stable  Post vital signs: Reviewed and stable  Last Vitals:  Vitals Value Taken Time  BP 137/70 05/21/23 1500  Temp    Pulse 76 05/21/23 1506  Resp 15 05/21/23 1506  SpO2 96 % 05/21/23 1506  Vitals shown include unfiled device data.  Last Pain:  Vitals:   05/21/23 1100  TempSrc: Oral         Complications: No notable events documented.

## 2023-05-21 NOTE — Code Documentation (Signed)
 Stroke Response Nurse Documentation Code Documentation  Dale Perez s a 68 year old male arriving to [facility] as a preactivated Code IR. Report received from [facility] [employer]. Per EMS, patient with a LKW at 2300 05/20/2023 and woke up this morning 05/21/2023 at 0900 with speech difficulty and right facial numbness/droop. Per [employer], patient with an initial NIH of 9 for decreased LOC, right facial droop, right arm ataxia, decreased sensation to the right side, aphasia, and dysarthria. Patient left [facility] ED at 1125.  Patient arrived at [facility] at 1150. Current NIH 8. Patient assessed en route to IR. Arrival to IR Suite at 1157  Care Plan: Transferred to IR  Bedside handoff with IR RN Ashura  Selestino Dakin Stroke Response RN

## 2023-05-21 NOTE — Consult Note (Signed)
 [facility]LE[facility]  CONSULT   Date of service: May 21, 2023 Patient Name: Dale Perez MRN:  [id number] DOB:  1956-01-05 Requesting Provider: Dr. Estell Harpin Location of the provider: [facility] Location of the patient: [facility] emergency department Reason for consult: "Code stroke, aphasia and mild right-sided numbness"   This consult was provided via telemedicine with 2-way video and audio communication. The patient/family was informed that care would be provided in this way and agreed to receive care in this manner.    History of Present Illness  Dale Perez is a 68 y.o. male with PMH significant for abdominal aortic aneurysm, hyperlipidemia who was last seen at his baseline at 2300 on 05/20/2023.  His wife went to bed at that time and patient was still awake.  In the morning, wife woke up and upon awakening patient up, noted that he was mumbling, not making sense.  He did not know how to put his shirt on and could not button his pants up.  This is very odd that he is completely independent at baseline and does not need any help.  Wife noted that his face was drooping on the right side.  She called EMS and he was brought into [facility] emergency department where a code stroke was activated upon arrival.  Patient had a CT head without contrast which was negative for acute intracranial abnormalities.  Patient had a CTA of the head and neck which shows proximal left ICA occlusion in the neck and going into the mid petrous segment felt to be acute with a questionable string sign?  CT perfusion demonstrated no core with a large mismatch.   Stroke Measures   Last Known Well: 2300 TNK Given: Not offered, patient is outside the window. IR Thrombectomy: Yes.  I extensively discussed thrombectomy with my colleagues including Dr. Kerby Nora and Dr. Marvel Plan.  Given the acute onset of his symptoms,  concern for a questionable string sign and the noted large penumbra on the CT perfusion, the occlusion was felt to be acute and given disabling language deficit, thrombectomy and stenting was discussed with patient's wife Dale Perez and with her daughters. Patient appers to be struggling with basic tasks(buttoning his pants, putting on his shirt, carryinv on a conversation), thou can follow most commands. We discussed the details of the procedure and the associated risk of ICH, distal embolization with patient's wife. Dr. Corliss Skains, again discussed the risks and benefits with wife and she consented for procedure.  There was seom delay in activating IR given the extensive discussion between me, Dr. Roda Shutters and Dr. Corliss Skains and then discussion with patient's wife and daughters later.  mRS: Modified Rankin Scale: 0-Completely asymptomatic and back to baseline post- stroke Time of teleneurologist evaluation: 1119   Vitals   Vitals:   05/21/23 1052 05/21/23 1054 05/21/23 1056 05/21/23 1058  BP:  (!) 165/94    Pulse:  72    Resp:  16    Temp:   98.2 F (36.8 C) 97.7 F (36.5 C)  TempSrc:   Axillary Oral  SpO2:  93%    Weight: 80.1 kg     Height: 6\' 1"  (1.854 m)        Body mass index is 23.3 kg/m.   Tele-neuro Exam and NIHSS   General: awake, alert and in no distress.  NIHSS components Score: Comment  1a Level of Conscious 0[x]  1[]  2[]  3[]   1b LOC Questions 0[]  1[]  2[x]       1c LOC Commands 0[x]  1[]  2[]       2 Best Gaze 0[x]  1[]  2[]       3 Visual 0[x]  1[]  2[]  3[]      4 Facial Palsy 0[]  1[]  2[x]  3[]      5a Motor Arm - left 0[x]  1[]  2[]  3[]  4[]  UN[]    5b Motor Arm - Right 0[x]  1[]  2[]  3[]  4[]  UN[]    6a Motor Leg - Left 0[x]  1[]  2[]  3[]  4[]  UN[]    6b Motor Leg - Right 0[x]  1[]  2[]  3[]  4[]  UN[]    7 Limb Ataxia 0[]  1[x]  2[]  3[]  UN[]   Slight RUE ataxia.  8 Sensory 0[]  1[x]  2[]  UN[]      9 Best Language 0[]  1[]  2[x]  3[]      10 Dysarthria 0[]  1[]  2[x]  UN[]      11 Extinct. and  Inattention 0[x]  1[]  2[]       TOTAL: 10     Imaging and Labs   CBC:  Recent Labs  Lab 05/21/23 1059 05/21/23 1102  WBC 8.8  --   HGB 16.5 17.0  HCT 51.1 50.0  MCV 89.8  --   PLT 242  --     Basic Metabolic Panel:  Lab Results  Component Value Date   NA 139 05/21/2023   K 4.6 05/21/2023   CO2 30 04/19/2019   GLUCOSE 100 (H) 05/21/2023   BUN 21 05/21/2023   CREATININE 1.00 05/21/2023   CALCIUM 8.8 (L) 04/19/2019   GFRNONAA >60 04/19/2019   GFRAA >60 04/19/2019   Lipid Panel: No results found for: "LDLCALC" HgbA1c: No results found for: "HGBA1C" Urine Drug Screen: No results found for: "LABOPIA", "COCAINSCRNUR", "LABBENZ", "AMPHETMU", "THCU", "LABBARB"  Alcohol Level     Component Value Date/Time   [facility] <10 04/09/2019 1645   Imaging:   Impression   Dale Perez is a 68 y.o. male who presents with aphasia, right-sided numbness, right facial droop, dysarthric speech and right arm ataxia with the last seen normal of 2300 on 05/20/23.  Symptoms most concerning for left hemispheric stroke.  CT head with no acute intracranial abnormalities.  He was outside the [facility] window on arrival.  He was found to have proximal left ICA occlusion up to the petrous part with distal reconstitution.  There was a questionable string sign.  CT perfusion with a large MCA distribution penumbra with no cord.  Given the acute onset of his symptoms along with disabling deficits and large penumbra, thrombectomy and stenting was extensively discussed with patient's wife including risk for ICH and distal embolization.  Patient's wife consented to the procedure and patient is being emergently transferred to [facility] for intervention.  Recommendations  - loaded with Brilinta 180mg  once at [facility] ED. - Frequent Neuro checks per stroke unit protocol - Recommend brain imaging with MRI Brain without contrast - Recommend obtaining TTE  - Recommend obtaining Lipid panel with LDL - Please  start statin if LDL > 70 - Recommend HbA1c to evaluate for diabetes and how well it is controlled. - Antithrombotic - Per Neuro IR for the first 24 hours after intervention - Recommend DVT ppx - SBP goal - Per Neuro IR for the first 24 hours after intevention. - Recommend Telemetry monitoring for arrythmia - Recommend bedside swallow screen prior to PO intake. - Stroke education booklet - Recommend PT/OT/SLP consult  ______________________________________________________________________  This patient is critically ill and at significant risk of neurological worsening, death  and care requires constant monitoring of vital signs, hemodynamics,respiratory and cardiac monitoring, neurological assessment, discussion with family, other specialists and medical decision making of high complexity. I spent 65 minutes of neurocritical care time  in the care of  this patient. This was time spent independent of any time provided by nurse practitioner or PA.  Parvin Stetzer [facility] 05/21/2023  11:40 AM  This patient is receiving care for possible acute neurological changes. There was 65 minutes of care by this provider at the time of service, including time for direct evaluation via telemedicine, review of medical records, imaging studies and discussion of findings with providers, the patient and/or family.  Morelia Cassells [facility] Pager Number [phone]  If 7pm- 7am, please page neurology on call as listed in AMION.

## 2023-05-21 NOTE — ED Provider Notes (Signed)
 [place] EMERGENCY DEPARTMENT AT [facility] Provider Note   CSN: [id number] Arrival date & time: 05/21/23  1020     History  Chief Complaint  Patient presents with   Code Stroke    Dale Perez is a 68 y.o. male.  Patient presented with right-sided facial weakness and difficulty speaking.  This started last night.  His wife found him this way in the morning  The history is provided by the EMS personnel. The history is limited by the condition of the patient.  Weakness Severity:  Moderate Onset quality:  Sudden Timing:  Constant Progression:  Unchanged Chronicity:  New Context: not alcohol use   Relieved by:  Nothing Worsened by:  Nothing      Home Medications Prior to Admission medications   Medication Sig Start Date End Date Taking? Authorizing Provider  atorvastatin (LIPITOR) 10 MG tablet Take 10 mg by mouth daily.    [provider]  naproxen sodium (ALEVE) 220 MG tablet Take 440 mg by mouth daily.     [provider]      Allergies    Penicillins, Quinolones, and Aspirin    Review of Systems   Review of Systems  Unable to perform ROS: Acuity of condition    Physical Exam Updated Vital Signs BP (!) 172/90 (BP Location: Left Arm)   Pulse 75   Temp 98.3 F (36.8 C) (Oral)   Resp 13   Ht 6\' 1"  (1.854 m)   Wt 80.1 kg   SpO2 95%   BMI 23.30 kg/m  Physical Exam Vitals and nursing note reviewed.  Constitutional:      Appearance: He is well-developed.  HENT:     Head: Normocephalic.     Nose: Nose normal.  Eyes:     General: No scleral icterus.    Conjunctiva/sclera: Conjunctivae normal.  Neck:     Thyroid: No thyromegaly.  Cardiovascular:     Rate and Rhythm: Normal rate and regular rhythm.     Heart sounds: No murmur heard.    No friction rub. No gallop.  Pulmonary:     Breath sounds: No stridor. No wheezing or rales.  Chest:     Chest wall: No tenderness.  Abdominal:     General: There is no distension.      Tenderness: There is no abdominal tenderness. There is no rebound.  Musculoskeletal:        General: Normal range of motion.     Cervical back: Neck supple.  Lymphadenopathy:     Cervical: No cervical adenopathy.  Skin:    Findings: No erythema or rash.  Neurological:     Mental Status: He is alert.     Motor: No abnormal muscle tone.     Coordination: Coordination normal.     Comments: Right-sided facial weakness with slurred speech  Psychiatric:        Behavior: Behavior normal.     ED Results / Procedures / Treatments   Labs (all labs ordered are listed, but only abnormal results are displayed) Labs Reviewed  COMPREHENSIVE METABOLIC PANEL WITH GFR - Abnormal; Notable for the following components:      Result Value   Sodium 134 (*)    Glucose, Bld 105 (*)    Calcium 8.2 (*)    Total Protein 5.8 (*)    Albumin 3.2 (*)    All other components within normal limits  CBG MONITORING, ED - Abnormal; Notable for the following components:   Glucose-Capillary  111 (*)    All other components within normal limits  I-STAT CHEM 8, ED - Abnormal; Notable for the following components:   Glucose, Bld 100 (*)    Calcium, Ion 1.06 (*)    All other components within normal limits  SARS CORONAVIRUS 2 BY RT PCR  ETHANOL  PROTIME-INR  APTT  CBC  DIFFERENTIAL  RAPID URINE DRUG SCREEN, HOSP PERFORMED  URINALYSIS, ROUTINE W REFLEX MICROSCOPIC    EKG None  Radiology CT ANGIO HEAD NECK W WO CM W PERF (CODE STROKE) Result Date: 05/21/2023 CLINICAL DATA:  Neuro deficit, acute stroke, speech difficulty and right-sided facial numbness/droop. EXAM: CT ANGIOGRAPHY HEAD AND NECK CT PERFUSION BRAIN TECHNIQUE: Multidetector CT imaging of the head and neck was performed using the standard protocol during bolus administration of intravenous contrast. Multiplanar CT image reconstructions and MIPs were obtained to evaluate the vascular anatomy. Carotid stenosis measurements (when applicable) are  obtained utilizing NASCET criteria, using the distal internal carotid diameter as the denominator. Multiphase CT imaging of the brain was performed following IV bolus contrast injection. Subsequent parametric perfusion maps were calculated using RAPID software. RADIATION DOSE REDUCTION: This exam was performed according to the departmental dose-optimization program which includes automated exposure control, adjustment of the mA and/or kV according to patient size and/or use of iterative reconstruction technique. CONTRAST:  100mL OMNIPAQUE IOHEXOL 350 MG/ML SOLN COMPARISON:  Same-day head CT. FINDINGS: CTA NECK FINDINGS Aortic arch: Standard configuration of the aortic arch. Mild atherosclerosis. Imaged portion shows no evidence of aneurysm or dissection. There is at least mild narrowing at the origin of the left common carotid artery. Otherwise no significant stenosis of the major aortic arch vessels. Pulmonary arteries: As permitted by contrast timing, there are no filling defects in the visualized pulmonary arteries. Subclavian arteries: Patent bilaterally. Atherosclerosis involving the proximal aspect of both subclavian arteries. There is noncalcified atherosclerotic plaque in the proximal left subclavian artery with mild stenosis. Right carotid system: Patent from the origin to the skull base. Mild atherosclerosis of the common carotid artery. Additional atherosclerotic plaque with irregular noncalcified component within the distal common carotid artery. Calcified atherosclerosis at the carotid bifurcation without hemodynamically significant stenosis. Left carotid system: There is at least mild narrowing at the origin of the left common carotid artery. Additional atherosclerosis along the common carotid artery. Calcified atherosclerosis at the carotid bifurcation extending into the proximal cervical ICA with likely noncalcified component. Abrupt occlusion of the proximal left cervical ICA. There is  reconstitution of the distal cervical segment which is slightly diminished in caliber with significantly diminished intraluminal contrast. Vertebral arteries: Code dominant. The vertebral arteries are patent from the origins to the vertebrobasilar confluence. Atherosclerosis involving the origins of both vertebral arteries resulting in moderate stenosis on the right and moderate to severe stenosis on the left. Skeleton: No acute findings. Degenerative changes in the cervical spine. Other neck: The visualized airway is patent. No cervical lymphadenopathy. Upper chest: Visualized lung apices are clear. Review of the MIP images confirms the above findings CTA HEAD FINDINGS ANTERIOR CIRCULATION: The left internal carotid artery is occluded at the skull base with reconstitution of the proximal/mid petrous segment. The left ICA is patent from the petrous segment to the ICA terminus. Atherosclerosis of carotid siphons. There is mild-to-moderate stenosis of the left cavernous ICA. The right ICA is patent from the skull base to the ICA terminus without significant stenosis. MCAs: The middle cerebral arteries are patent bilaterally. ACAs: Patent bilaterally. Diminutive A1 segment of the left  ACA which may be congenital versus related to atherosclerosis. POSTERIOR CIRCULATION: No significant stenosis, proximal occlusion, aneurysm, or vascular malformation. PCAs: The posterior cerebral arteries are patent bilaterally. Pcomm: Not well visualized. SCAs: The superior cerebellar arteries are patent bilaterally. Basilar artery: Patent AICAs: Patent PICAs: Patent Vertebral arteries: The intracranial vertebral arteries are patent. Venous sinuses: As permitted by contrast timing, patent. Anatomic variants: None Review of the MIP images confirms the above findings CT Brain Perfusion Findings: ASPECTS: 10 CBF (<30%) Volume: 0mL Perfusion (Tmax>6.0s) volume: Mismatch Volume: Infarction Location:There is no core infarct  identified on CT perfusion. Diffuse elevated T-max throughout the left MCA territory suggestive of ischemic penumbra. IMPRESSION: Occlusion of the left ICA from the proximal cervical segment to the proximal/mid petrous segment which is likely acute and related to prominent atherosclerotic plaque at the carotid bifurcation. Intracranial arterial vasculature is otherwise patent. No core infarct identified on CT perfusion. Large region of elevated T-max in the left MCA territory concerning for ischemic penumbra. Multifocal atherosclerosis. Focal mild-to-moderate stenosis of the left cavernous ICA. Additional atherosclerosis at the right carotid bifurcation without hemodynamically significant stenosis. There is at least mild stenosis at the origin of the left common carotid artery. Atherosclerosis at the origins of the vertebral arteries with moderate to severe stenosis on the left and moderate stenosis on the right. Aortic Atherosclerosis (ICD10-I70.0). The findings of left ICA occlusion and CT perfusion findings were called by telephone at the time of noncontrast CT interpretation on 05/21/2023 at 10:46 am to provider [facility] , who verbally acknowledged these results. Electronically Signed   By: Denny Flack M.D.   On: 05/21/2023 11:22   CT HEAD CODE STROKE WO CONTRAST Result Date: 05/21/2023 CLINICAL DATA:  Code stroke. Neuro deficit, aphasia, right-sided facial droop. EXAM: CT HEAD WITHOUT CONTRAST TECHNIQUE: Contiguous axial images were obtained from the base of the skull through the vertex without intravenous contrast. RADIATION DOSE REDUCTION: This exam was performed according to the departmental dose-optimization program which includes automated exposure control, adjustment of the mA and/or kV according to patient size and/or use of iterative reconstruction technique. COMPARISON:  CT head 04/09/2019. FINDINGS: Brain: No acute intracranial hemorrhage. No CT evidence of acute infarct. Nonspecific  hypoattenuation in the periventricular and subcortical white matter favored to reflect chronic microvascular ischemic changes. Remote infarct involving the right caudate head and anterior limb of the internal capsule. Additional small remote lacunar infarct in the left thalamus. Remote cortical/subcortical infarcts in the posterior left frontal lobe and left parietal lobe. No edema, mass effect, or midline shift. The basilar cisterns are patent. Ventricles: The ventricles are normal. Vascular: Atherosclerotic calcifications of the carotid siphons. No hyperdense vessel. Skull: No acute or aggressive finding. Orbits: Orbits are symmetric. Sinuses: Mild mucosal thickening in the ethmoid sinuses. Other: Right mastoid air cells are clear. Sequelae of left mastoidectomy, likely canal wall down. ASPECTS [facility] Stroke Program Early CT Score) - Ganglionic level infarction (caudate, lentiform nuclei, internal capsule, insula, M1-M3 cortex): 7 - Supraganglionic infarction (M4-M6 cortex): 3 Total score (0-10 with 10 being normal): 10 IMPRESSION: 1. No CT evidence of acute intracranial abnormality. 2. Multiple remote infarcts as above. Mild chronic microvascular ischemic changes. 3. ASPECTS is 10 These results were called by telephone at the time of interpretation on 05/21/2023 at 10:46 Am to provider Dr. Shane Darling, who verbally acknowledged these results. Electronically Signed   By: Denny Flack M.D.   On: 05/21/2023 11:03    Procedures Procedures    Medications Ordered  in ED Medications  tenecteplase (TNKASE) 50 MG injection for Stroke (  Not Given 05/21/23 1114)  iohexol (OMNIPAQUE) 350 MG/ML injection 100 mL (100 mLs Intravenous Contrast Given 05/21/23 1101)  ticagrelor (BRILINTA) tablet 180 mg (180 mg Oral Given 05/21/23 1123)    ED Course/ Medical Decision Making/ A&P   CRITICAL CARE Performed by: Cheyenne Cotta Total critical care time: 45 minutes Critical care time was exclusive of separately  billable procedures and treating other patients. Critical care was necessary to treat or prevent imminent or life-threatening deterioration. Critical care was time spent personally by me on the following activities: development of treatment plan with patient and/or surrogate as well as nursing, discussions with consultants, evaluation of patient's response to treatment, examination of patient, obtaining history from patient or surrogate, ordering and performing treatments and interventions, ordering and review of laboratory studies, ordering and review of radiographic studies, pulse oximetry and re-evaluation of patient's condition. nd re-evaluation of patient's condition.  Click here for ABCD2, HEART and other calculatorsREFRESH Note before signing :1}                              Medical Decision Making Amount and/or Complexity of Data Reviewed Labs: ordered. Radiology: ordered.  Risk Decision regarding hospitalization.  Patient with an acute stroke with an LVO.  He is being transported emergently over to [facility] for interventional radiology.  Dr. Alvira Josephs to see  the pt for the interventional procedure.  And he will be admitted to neurology        Final Clinical Impression(s) / ED Diagnoses Final diagnoses:  Acute ischemic stroke [facility])    Rx / DC Orders ED Discharge Orders     None         Cheyenne Cotta, MD 05/21/23 1839

## 2023-05-21 NOTE — Procedures (Signed)
 INR.  Status post left comfort arteriogram.  Right CFA approach.  Findings.  Occluded left internal carotid artery proximally.  Status post revascularization of occluded left internal carotid proximally  Status post revascularization of a branch of the superior division of the left MCA in the mid  M2 with 1 pass with a   4 mm x 40 mm solitaire X retriever and contact aspiration  achieving a TICI 3 revascularization   Post CT brain no evidence of intracranial hemorrhage.    8 [place]  angioseal closure device deployed  at the right groin puncture site.  Distal pulses all intact  Medications given cangrelor half bolus dose followed by 4-hour half dose infusion ending at  6:15 PM followed by an immediate CT of the brain without.   Orders written  for follow-up scan.   Patient extubated.  Following simple commands appropriately.  Pupils 2 mm equal bilaterally sluggishly reactive.  Mild right facial droop.  Tongue midline.  Mild weakness of the right upper and right lower extremities.  Jory Ng MD.

## 2023-05-21 NOTE — Anesthesia Preprocedure Evaluation (Signed)
 Anesthesia Evaluation  Patient identified by MRN, date of birth, ID band  Reviewed: Allergy & Precautions, Patient's Chart, lab work & pertinent test results, Unable to perform ROS - Chart review onlyPreop documentation limited or incomplete due to emergent nature of procedure.  History of Anesthesia Complications Negative for: history of anesthetic complications  Airway Mallampati: Unable to assess       Dental   Pulmonary Current Smoker and Patient abstained from smoking.   breath sounds clear to auscultation       Cardiovascular  Rhythm:Regular     Neuro/Psych CVA, Residual Symptoms    GI/Hepatic   Endo/Other    Renal/GU Lab Results      Component                Value               Date                      NA                       139                 05/21/2023                K                        4.6                 05/21/2023                CO2                      23                  05/21/2023                GLUCOSE                  100 (H)             05/21/2023                BUN                      21                  05/21/2023                CREATININE               1.00                05/21/2023                CALCIUM                  8.2 (L)             05/21/2023                GFRNONAA                 >60                 05/21/2023                Musculoskeletal   Abdominal   Peds  Hematology Lab  Results      Component                Value               Date                      WBC                      8.8                 05/21/2023                HGB                      17.0                05/21/2023                HCT                      50.0                05/21/2023                MCV                      89.8                05/21/2023                PLT                      242                 05/21/2023              Anesthesia Other Findings Acute stroke with residual symptoms   Reproductive/Obstetrics                              Anesthesia Physical Anesthesia Plan  ASA: 3 and emergent  Anesthesia Plan: General   Post-op Pain Management:    Induction: Intravenous, Rapid sequence and Cricoid pressure planned  PONV Risk Score and Plan: 1 and Ondansetron and Dexamethasone  Airway Management Planned: Oral ETT  Additional Equipment: None  Intra-op Plan:   Post-operative Plan: Possible Post-op intubation/ventilation  Informed Consent:      History available from chart only and Only emergency history available  Plan Discussed with: CRNA and Surgeon  Anesthesia Plan Comments:          Anesthesia Quick Evaluation

## 2023-05-21 NOTE — ED Notes (Signed)
 BP 169/105 for EMS.

## 2023-05-21 NOTE — Sedation Documentation (Addendum)
 Delay in starting, anesthesia MD NOT PRESENT

## 2023-05-21 NOTE — ED Notes (Signed)
 In room ED 2 at this time. Family present and updated. Pt able to say more words than when first arrived. Pt now able to say some things that is going on in the stroke picture. Pt keeps pushing arms out and clinching fist and stated arm is numb when lifted the right one. Initial NIH 9. Now NIH 6. Neuro aware.

## 2023-05-21 NOTE — Progress Notes (Signed)
 PT Cancellation Note  Patient Details Name: Dale Perez MRN: [id number] DOB: [birth year]   Cancelled Treatment:    Reason Eval/Treat Not Completed: (P) Active bedrest order. Will plan to follow-up tomorrow as able once activity orders are updated.   Vernida Goodie, PT, DPT Acute Rehabilitation Services  Office: [phone]    Ellyn Hack 05/21/2023, 5:00 PM

## 2023-05-21 NOTE — ED Notes (Signed)
 Pt in CT at 1025

## 2023-05-21 NOTE — ED Notes (Signed)
 Pt remains in CT at this time. Tele-neurologist is talking with the family (wife, daughter) on the phone currently.

## 2023-05-21 NOTE — ED Notes (Signed)
 Pt has returned to ED room from CT. Tele-neurologist on the screen evaluating pt.

## 2023-05-21 NOTE — H&P (Signed)
 NEUROLOGY H&P NOTE   Date of service: May 21, 2023 Patient Name: Dale Perez MRN:  [id number] DOB:  10/03/1955 Chief Complaint: "Aphasia, right-sided numbness and disorientation"  History of Present Illness  Dale Perez is a 68 y.o. male with hx of smoking, hyperlipidemia and abdominal aortic aneurysm (undergoing serial monitoring, slowly enlarging on last imaging but awaiting confirmatory ultrasound) who presented to [facility] with aphasia, disorientation, right-sided facial droop and right-sided numbness.  He presented outside the window for TNK.  Head CT revealed no acute abnormalities, and CTA head and neck demonstrated left ICA occlusion, CT perfusion demonstrated no core infarct and large, 107 mL penumbra.  Consent was obtained from family for interventional radiology, and patient was transferred here for mechanical thrombectomy.  Last known well: 4/16 2300 Modified rankin score: 0-Completely asymptomatic and back to baseline post- stroke IV Thrombolysis: No, outside the window Thrombectomy: Yes NIHSS components Score: Comment  1a Level of Conscious 0[x]  1[]  2[]  3[]      1b LOC Questions 0[]  1[x]  2[]       1c LOC Commands 0[x]  1[]  2[]       2 Best Gaze 0[x]  1[]  2[]       3 Visual 0[x]  1[]  2[]  3[]      4 Facial Palsy 0[]  1[]  2[x]  3[]      5a Motor Arm - left 0[x]  1[]  2[]  3[]  4[]  UN[]    5b Motor Arm - Right 0[x]  1[]  2[]  3[]  4[]  UN[]    6a Motor Leg - Left 0[x]  1[]  2[]  3[]  4[]  UN[]    6b Motor Leg - Right 0[x]  1[]  2[]  3[]  4[]  UN[]    7 Limb Ataxia 0[]  1[]  2[]  3[]  UN[x]     8 Sensory 0[]  1[x]  2[]  UN[]      9 Best Language 0[]  1[]  2[x]  3[]      10 Dysarthria 0[]  1[x]  2[]  UN[]      11 Extinct. and Inattention 0[x]  1[]  2[]       TOTAL:7       ROS   Unable to perform due to aphasia  Past History   Past Medical History:  Diagnosis Date   AAA (abdominal aortic aneurysm) (HCC) 04/2019   4.4cm   Past Surgical History:  Procedure Laterality Date   APPENDECTOMY      COLONOSCOPY  11/2008   Rourk; sigmoid diverticula, otherwise normal exam. Repeat in 2020.    COLONOSCOPY N/A 09/09/2019   Procedure: COLONOSCOPY;  Surgeon: Corbin Ade, MD;  Location[facility];  Service: Endoscopy;  Laterality: N/A;  9:45am   I & D EXTREMITY Left 08/10/2015   Procedure: IRRIGATION AND DEBRIDEMENT EXTREMITY;  Surgeon: Mack Hook, MD;  Location: [facility] OR;  Service: Orthopedics;  Laterality: Left;   KNEE CARTILAGE SURGERY Right 02/2019   KNEE SURGERY     SHOULDER ARTHROSCOPY Left 08/2018   SHOULDER SURGERY     TENDON REPAIR N/A 08/10/2015   Procedure: TENDON REPAIR WITH WOUND CLOSURE;  Surgeon: Mack Hook, MD;  Location: [facility] OR;  Service: Orthopedics;  Laterality: N/A;   TONSILLECTOMY     Family History  Problem Relation Age of Onset   Heart attack Father    Colon cancer Neg Hx    Colon polyps Neg Hx    Social History   Socioeconomic History   Marital status: Married    Spouse name: Not on file   Number of children: Not on file   Years of education: Not on file   Highest education level: Not on file  Occupational History  Not on file  Tobacco Use   Smoking status: Every Day    Current packs/day: 1.00    Average packs/day: 1 pack/day for 40.0 years (40.0 ttl pk-yrs)    Types: Cigarettes   Smokeless tobacco: Never  Vaping Use   Vaping status: Never Used  Substance and Sexual Activity   Alcohol use: Yes    Comment: rarely   Drug use: Never   Sexual activity: Yes  Other Topics Concern   Not on file  Social History Narrative   ** Merged History Encounter **       Social Drivers of Health   Financial Resource Strain: Not on file  Food Insecurity: Low Risk  (07/14/2022)   Received from [facility]   Hunger Vital Sign    Worried About Running Out of Food in the Last Year: Never true    Ran Out of Food in the Last Year: Never true  Transportation Needs: Not on file (07/14/2022)  Physical Activity: Not on file  Stress: Not on file  Social  Connections: Not on file   Allergies  Allergen Reactions   Penicillins Other (See Comments)    unknown   Quinolones Other (See Comments)    Patient has an AAA Patient has an AAA   Aspirin Hives, Rash and Other (See Comments)    Childhood / able to take 81 mg Childhood / able to take 81 mg    Medications   Current Facility-Administered Medications:    tenecteplase (TNKASE) 50 MG injection for Stroke, , , ,   Facility-Administered Medications Ordered in Other Encounters:    acetaminophen (TYLENOL) tablet 650 mg, 650 mg, Oral, Q6H, Violeta Gelinas, MD, 650 mg at 04/19/19 1207   HYDROmorphone (DILAUDID) injection 1 mg, 1 mg, Intravenous, Q2H PRN, Violeta Gelinas, MD, 1 mg at 04/15/19 2255   metoprolol tartrate (LOPRESSOR) injection 5 mg, 5 mg, Intravenous, Q6H PRN, Phylliss Blakes A, MD, 5 mg at 04/12/19 0146   Vitals   Vitals:   05/21/23 1100 05/21/23 1500 05/21/23 1515 05/21/23 1530  BP: (!) 172/90 137/70 129/69 135/85  Pulse: 75 75 69 75  Resp: 13 11 12 17   Temp: 98.3 F (36.8 C) 98.6 F (37 C)  98.6 F (37 C)  TempSrc: Oral     SpO2: 95% 99% 96% 96%  Weight:      Height:         Body mass index is 23.3 kg/m.  Physical Exam   Constitutional: Appears well-developed and well-nourished.  Psych: Affect appropriate to situation.  Eyes: No scleral injection.  HENT: No OP obstruction.  Head: Normocephalic.  Respiratory: Effort normal, non-labored breathing.  Skin: WDI.   Neurologic Examination    NEURO:  Mental Status: Patient is alert and responds to name and is able to follow simple commands Speech/Language: speech is with mild dysarthria and a significant expressive aphasia.  Speech is in single words and is difficult to understand  Cranial Nerves:  II: PERRL.  III, IV, VI: EOMI. Eyelids elevate symmetrically.  VII: Right-sided facial droop VIII: hearing intact to voice. IX, X: Voice is dysarthric Motor: Able to move all 4 extremities with antigravity  strength, mild pronation without drift in the right upper extremity Tone: is normal and bulk is normal Sensation- Intact to light touch bilaterally.  Coordination: Unable to perform Gait- deferred  Of note he did seem to be fluctuating during exam even minute to minute   Labs   CBC:  Recent Labs  Lab 05/21/23  1059 05/21/23 1102  WBC 8.8  --   NEUTROABS 6.1  --   HGB 16.5 17.0  HCT 51.1 50.0  MCV 89.8  --   PLT 242  --    Basic Metabolic Panel:  Lab Results  Component Value Date   NA 139 05/21/2023   K 4.6 05/21/2023   CO2 23 05/21/2023   GLUCOSE 100 (H) 05/21/2023   BUN 21 05/21/2023   CREATININE 1.00 05/21/2023   CALCIUM 8.2 (L) 05/21/2023   GFRNONAA >60 05/21/2023   GFRAA >60 04/19/2019   Alcohol Level     Component Value Date/Time   ETH <10 05/21/2023 1059   INR  Lab Results  Component Value Date   INR 1.1 05/21/2023   APTT  Lab Results  Component Value Date   APTT 28 05/21/2023   CT Head without contrast: No acute abnormality, remote infarcts in right caudate head, anterior limb of right internal capsule, posterior left frontal lobe, left parietal lobe and left thalamus  CT angio Head and Neck with contrast(Personally reviewed): Occlusion of left ICA from proximal cervical segment to proximal mid petrous segment, no core infarct identified on CT perfusion but large ischemic penumbra  MRI Brain(Personally reviewed): Pending   Assessment   MEILECH VIRTS is a 68 y.o. male with history of smoking, hyperlipidemia and abdominal aortic aneurysm who presents with aphasia, confusion, right-sided facial droop and right-sided numbness.  Initial CT head was negative for acute abnormality, and patient presented outside the window for TNK.  CT angiogram demonstrated occlusion of left ICA, and patient was transferred from [facility] emergency department to our facility for mechanical thrombectomy.  Thrombectomy has been performed with revascularization of  occluded ICA and M2 branch of left MCA.  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    Recommendations   - Admit to ICU for monitoring and stroke workup - Postprocedure care per Dr. Alvira Josephs - Keep systolic blood pressure between 120 and 160 for 24 hours - MRI brain wo contrast - TTE  - Check A1c and LDL + add statin per guidelines - antiplt/anticoag aspirin 81 mg daily and Brilinta 90 mg twice daily (per neuro IR) - Neurochecks and NIHSS Q 15 minutes x 4, every 30 minutes x 1 hour, then hourly - STAT head CT for any change in neuro exam - Tele - PT/OT/SLP - Stroke education - Amb referral to neurology upon discharge   ______________________________________________________________________   Signed, Cortney Alvin Axe, NP [facility]  Attending Neurologist's note:  I personally saw this patient, gathering history, performing a full neurologic examination, reviewing relevant labs, personally reviewing relevant imaging including CTA/CTP, and formulated the assessment and plan, adding the note above for completeness and clarity to accurately reflect my thoughts  Baldwin Levee MD-PhD [facility] [phone] Available 7 AM to 7 PM, outside these hours please contact Neurologist on call listed on AMION   CRITICAL CARE Performed by: Ronnette Coke   Total critical care time: 35 minutes  Critical care time was exclusive of separately billable procedures and treating other patients.  Critical care was necessary to treat or prevent imminent or life-threatening deterioration.  Critical care was time spent personally by me on the following activities: development of treatment plan with patient and/or surrogate as well as nursing, discussions with consultants, evaluation of patient's response to treatment, examination of patient, obtaining history from patient or surrogate, ordering and performing treatments and  interventions, ordering and review  of laboratory studies, ordering and review of radiographic studies, pulse oximetry and re-evaluation of patient's condition.

## 2023-05-21 NOTE — Progress Notes (Signed)
 Logged on camera at 1058. Dr. Khaliqdina logged back on cart and discussed pt being transferred for intervention with family. Family agreed to transfer. Pt left ED at 1126 en route to [facility].

## 2023-05-21 NOTE — Progress Notes (Signed)
 Telestroke Nurse    1019: Code stroke cart activated by nursing at this time. Patient presents via EMS. Per EMS, wife states LKW 4/16 at 2300. mRS 0. Upon waking, patients wife noted a right facial droop and aphasia.   1020: Dr.Khaliqdina paged. Dr.Zammit assessing patient.   1022: Dr.Khaliqdina on camera. Patient report provided to Dr.Khaliqdina at this time. Patient transferring to CT.   1032: Dr.Khaliqdina speaking to patients wife at this time.  1046: Dr.Khaliqdina speaking with neuro-radiology regarding CT imaging results.   1047: CT imaging complete. Patient transferring to ED room from CT.   1048: Patient returned to room. Patients wife and family present at bedside.   1055: Dr.Khaliqdina discussing code stroke imaging results with patient and patients family.  1056: Dr.Khaliqdina logged off cart to call his colleagues at [facility] regarding IR acceptance for intervention.   1058: Parks Bollman RN on camera. Patient history, updates, and report provided to Parks Bollman RN on camera.   1103: This RN logged off stroke cart at this time.    Belvie Boyers Telestroke RN

## 2023-05-21 NOTE — ED Notes (Signed)
 Pt's yellow colored necklace removed and taken by pt's wife. One hearing aid given to pt's wife.

## 2023-05-21 NOTE — Anesthesia Postprocedure Evaluation (Signed)
 Anesthesia Post Note  Patient: Dale Perez  Procedure(s) Performed: RADIOLOGY WITH ANESTHESIA     Patient location during evaluation: PACU Anesthesia Type: General Level of consciousness: awake and alert Pain management: pain level controlled Vital Signs Assessment: post-procedure vital signs reviewed and stable Respiratory status: spontaneous breathing, nonlabored ventilation, respiratory function stable and patient connected to nasal cannula oxygen Cardiovascular status: blood pressure returned to baseline and stable Postop Assessment: no apparent nausea or vomiting Anesthetic complications: no   No notable events documented.  Last Vitals:  Vitals:   05/21/23 1100 05/21/23 1500  BP: (!) 172/90 137/70  Pulse: 75 75  Resp: 13 11  Temp: 36.8 C 37 C  SpO2: 95% 99%    Last Pain:  Vitals:   05/21/23 1100  TempSrc: Oral                 [name]

## 2023-05-21 NOTE — ED Notes (Signed)
Report given to Deanna RN.

## 2023-05-21 NOTE — ED Notes (Signed)
 Per ems wife told them pt possibly fell this am due to a shin cut noted by wife

## 2023-05-21 NOTE — Anesthesia Procedure Notes (Signed)
 Procedure Name: Intubation Date/Time: 05/21/2023 12:29 PM  Performed by: Artemisa Bile D, CRNAPre-anesthesia Checklist: Patient identified, Emergency Drugs available, Suction available and Patient being monitored Patient Re-evaluated:Patient Re-evaluated prior to induction Oxygen Delivery Method: Circle system utilized Preoxygenation: Pre-oxygenation with 100% oxygen Induction Type: IV induction Laryngoscope Size: Mac and 4 Grade View: Grade II Tube type: Oral Tube size: 7.5 mm Number of attempts: 1 Airway Equipment and Method: Stylet and Oral airway Placement Confirmation: ETT inserted through vocal cords under direct vision, positive ETCO2 and breath sounds checked- equal and bilateral Secured at: 24 cm Tube secured with: Tape Dental Injury: Teeth and Oropharynx as per pre-operative assessment  Comments: I. Kolencherry SRNA, mask ventilation not attempted- RSI

## 2023-05-21 NOTE — ED Triage Notes (Addendum)
 Pt arrived alert. Per wife LKW was at bedtime last night at 11pm.  Woke at 9am with speech difficulty and right side facial numbness/droop. Edp and neuro with pt when entered ED.  Pt had tick bite and had ct with contrast yesterday. Was given 50 benadryl and 125 solumedrol in route due to hives. Cbg on arrival 111. Pt moving all extremities. Pupils perrla. Aphasia noted. See NIH.

## 2023-05-21 NOTE — ED Notes (Signed)
 Per teleneurologist code IR activating.

## 2023-05-21 NOTE — Progress Notes (Signed)
 Went to place a foley on this patient because he has not been able to urinate all day and a foley was not able to be placed prior to procedure. The patient was able to finally urinate 200cc and the pain has subsided. Will not place foley at this time.

## 2023-05-21 NOTE — ED Notes (Signed)
 Pt's daughter at bedside reports the CT scan was actually done Tuesday and not yesterday.

## 2023-05-22 ENCOUNTER — Inpatient Hospital Stay (HOSPITAL_COMMUNITY)

## 2023-05-22 ENCOUNTER — Telehealth (HOSPITAL_COMMUNITY): Payer: Self-pay | Admitting: Pharmacy Technician

## 2023-05-22 ENCOUNTER — Other Ambulatory Visit (HOSPITAL_COMMUNITY): Payer: Self-pay

## 2023-05-22 ENCOUNTER — Encounter (HOSPITAL_COMMUNITY): Payer: Self-pay | Admitting: Radiology

## 2023-05-22 DIAGNOSIS — I6389 Other cerebral infarction: Secondary | ICD-10-CM

## 2023-05-22 DIAGNOSIS — I63232 Cerebral infarction due to unspecified occlusion or stenosis of left carotid arteries: Secondary | ICD-10-CM | POA: Diagnosis not present

## 2023-05-22 LAB — ECHOCARDIOGRAM COMPLETE
Area-P 1/2: 3.99 cm2
Calc EF: 61.4 %
Height: 73 in
S' Lateral: 3.1 cm
Single Plane A2C EF: 63.8 %
Single Plane A4C EF: 59.2 %
Weight: 2825.42 [oz_av]

## 2023-05-22 LAB — COMPREHENSIVE METABOLIC PANEL WITH GFR
ALT: 20 U/L (ref 0–44)
AST: 18 U/L (ref 15–41)
Albumin: 3.1 g/dL — ABNORMAL LOW (ref 3.5–5.0)
Alkaline Phosphatase: 75 U/L (ref 38–126)
Anion gap: 9 (ref 5–15)
BUN: 16 mg/dL (ref 8–23)
CO2: 21 mmol/L — ABNORMAL LOW (ref 22–32)
Calcium: 8.5 mg/dL — ABNORMAL LOW (ref 8.9–10.3)
Chloride: 110 mmol/L (ref 98–111)
Creatinine, Ser: 0.91 mg/dL (ref 0.61–1.24)
GFR, Estimated: >60 mL/min (ref >60)
Glucose, Bld: 132 mg/dL — ABNORMAL HIGH (ref 70–99)
Potassium: 4.6 mmol/L (ref 3.5–5.1)
Sodium: 140 mmol/L (ref 135–145)
Total Bilirubin: 0.4 mg/dL (ref 0.0–1.2)
Total Protein: 5.7 g/dL — ABNORMAL LOW (ref 6.5–8.1)

## 2023-05-22 LAB — RAPID URINE DRUG SCREEN, HOSP PERFORMED
Amphetamines: NOT DETECTED
Barbiturates: NOT DETECTED
Benzodiazepines: POSITIVE — AB
Cocaine: NOT DETECTED
Opiates: NOT DETECTED
Tetrahydrocannabinol: NOT DETECTED

## 2023-05-22 LAB — LIPID PANEL
Cholesterol: 129 mg/dL (ref 0–200)
HDL: 25 mg/dL — ABNORMAL LOW (ref >40)
LDL Cholesterol: 88 mg/dL (ref 0–99)
Total CHOL/HDL Ratio: 5.2 ratio
Triglycerides: 82 mg/dL (ref <150)
VLDL: 16 mg/dL (ref 0–40)

## 2023-05-22 LAB — CBC WITH DIFFERENTIAL/PLATELET
Abs Immature Granulocytes: 0.09 10*3/uL — ABNORMAL HIGH (ref 0.00–0.07)
Basophils Absolute: 0 10*3/uL (ref 0.0–0.1)
Basophils Relative: 0 %
Eosinophils Absolute: 0 10*3/uL (ref 0.0–0.5)
Eosinophils Relative: 0 %
HCT: 41.9 % (ref 39.0–52.0)
Hemoglobin: 13.6 g/dL (ref 13.0–17.0)
Immature Granulocytes: 1 %
Lymphocytes Relative: 12 %
Lymphs Abs: 1.3 10*3/uL (ref 0.7–4.0)
MCH: 28.6 pg (ref 26.0–34.0)
MCHC: 32.5 g/dL (ref 30.0–36.0)
MCV: 88.2 fL (ref 80.0–100.0)
Monocytes Absolute: 0.5 10*3/uL (ref 0.1–1.0)
Monocytes Relative: 5 %
Neutro Abs: 8.8 10*3/uL — ABNORMAL HIGH (ref 1.7–7.7)
Neutrophils Relative %: 82 %
Platelets: 232 10*3/uL (ref 150–400)
RBC: 4.75 MIL/uL (ref 4.22–5.81)
RDW: 14.6 % (ref 11.5–15.5)
WBC: 10.7 10*3/uL — ABNORMAL HIGH (ref 4.0–10.5)
nRBC: 0 % (ref 0.0–0.2)

## 2023-05-22 LAB — HEMOGLOBIN A1C
Hgb A1c MFr Bld: 5.8 % — ABNORMAL HIGH (ref 4.8–5.6)
Mean Plasma Glucose: 119.76 mg/dL

## 2023-05-22 MED ORDER — ATORVASTATIN CALCIUM 40 MG PO TABS
40.0000 mg | ORAL_TABLET | Freq: Every day | ORAL | Status: DC
  Administered 2023-05-22 – 2023-05-23 (×2): 40 mg via ORAL
  Filled 2023-05-22 (×2): qty 1

## 2023-05-22 MED ORDER — ENOXAPARIN SODIUM 40 MG/0.4ML IJ SOSY
40.0000 mg | PREFILLED_SYRINGE | INTRAMUSCULAR | Status: DC
  Administered 2023-05-22 – 2023-05-23 (×2): 40 mg via SUBCUTANEOUS
  Filled 2023-05-22 (×2): qty 0.4

## 2023-05-22 NOTE — Progress Notes (Deleted)
 STROKE TEAM PROGRESS NOTE    SIGNIFICANT HOSPITAL EVENTS 4/17-patient had acute onset of aphasia disorientation and right-sided facial droop, right-sided numbness did not receive IV TNK due to being outside the window however did undergo thrombectomy for occluded left carotid artery with TICI 3 revascularization  INTERIM HISTORY/SUBJECTIVE Family at the bedside.  States he awoken with symptoms and he was trying to get out of bed and he fell on the floor, wife noted right side weakness and right facial droop States right side weakness is improved, still has some tingling on the right arm  Neurologic exam he is alert and oriented x 3, pupils equal and reactive, eyes are midline, tracks, lids are full, right facial droop, mild dysarthria, can follow commands, 5 out of 5 strength in all 4 extremities, sensation decreased on the right, no ataxia noted, gait tested  MRIs scheduled for 12 PM.  Will likely be able to transfer him out of the ICU to the floor  OBJECTIVE  CBC    Component Value Date/Time   WBC 10.7 (H) 05/22/2023 0434   RBC 4.75 05/22/2023 0434   HGB 13.6 05/22/2023 0434   HCT 41.9 05/22/2023 0434   PLT 232 05/22/2023 0434   MCV 88.2 05/22/2023 0434   MCH 28.6 05/22/2023 0434   MCHC 32.5 05/22/2023 0434   RDW 14.6 05/22/2023 0434   LYMPHSABS 1.3 05/22/2023 0434   MONOABS 0.5 05/22/2023 0434   EOSABS 0.0 05/22/2023 0434   BASOSABS 0.0 05/22/2023 0434    BMET    Component Value Date/Time   NA 140 05/22/2023 0434   K 4.6 05/22/2023 0434   CL 110 05/22/2023 0434   CO2 21 (L) 05/22/2023 0434   GLUCOSE 132 (H) 05/22/2023 0434   BUN 16 05/22/2023 0434   CREATININE 0.91 05/22/2023 0434   CALCIUM  8.5 (L) 05/22/2023 0434   GFRNONAA >60 05/22/2023 0434    IMAGING past 24 hours ECHOCARDIOGRAM COMPLETE Result Date: 05/22/2023    ECHOCARDIOGRAM REPORT   Patient Name:   JT BRABEC Date of Exam: 05/22/2023 Medical Rec #:  [id number]         Height:       73.0 in Accession  #:    [id number]        Weight:       176.6 lb Date of Birth:  [birth year]          BSA:          2.041 m Patient Age:    67 years          BP:           131/71 mmHg Patient Gender: M                 HR:           68 bpm. Exam Location:  Inpatient Procedure: 2D Echo, Cardiac Doppler and Color Doppler (Both Spectral and Color            Flow Doppler were utilized during procedure). Indications:    Stroke  History:        Patient has no prior history of Echocardiogram examinations.                 Risk Factors:Dyslipidemia and Current Smoker. Abdominal aortic                 aneurysm.  Sonographer:    Raynelle Callow RDCS Referring Phys: [id number] CORTNEY E DE LA TORRE IMPRESSIONS  1. Left  ventricular ejection fraction, by estimation, is 60 to 65%. The left ventricle has normal function. The left ventricle has no regional wall motion abnormalities. Left ventricular diastolic parameters were normal.  2. Right ventricular systolic function is normal. The right ventricular size is normal. Tricuspid regurgitation signal is inadequate for assessing PA pressure.  3. The mitral valve is normal in structure. Trivial mitral valve regurgitation.  4. The aortic valve is tricuspid. Aortic valve regurgitation is not visualized.  5. The inferior vena cava is dilated in size with <50% respiratory variability, suggesting right atrial pressure of 15 mmHg. Comparison(s): No prior Echocardiogram. FINDINGS  Left Ventricle: Left ventricular ejection fraction, by estimation, is 60 to 65%. The left ventricle has normal function. The left ventricle has no regional wall motion abnormalities. The left ventricular internal cavity size was normal in size. There is  no left ventricular hypertrophy. Left ventricular diastolic parameters were normal. Right Ventricle: The right ventricular size is normal. Right vetricular wall thickness was not assessed. Right ventricular systolic function is normal. Tricuspid regurgitation signal is inadequate for assessing  PA pressure. Left Atrium: Left atrial size was normal in size. Right Atrium: Right atrial size was normal in size. Pericardium: There is no evidence of pericardial effusion. Mitral Valve: The mitral valve is normal in structure. Trivial mitral valve regurgitation. Tricuspid Valve: The tricuspid valve is normal in structure. Tricuspid valve regurgitation is trivial. Aortic Valve: The aortic valve is tricuspid. Aortic valve regurgitation is not visualized. Pulmonic Valve: The pulmonic valve was not well visualized. Pulmonic valve regurgitation is not visualized. No evidence of pulmonic stenosis. Aorta: The aortic root and ascending aorta are structurally normal, with no evidence of dilitation. Venous: The inferior vena cava is dilated in size with less than 50% respiratory variability, suggesting right atrial pressure of 15 mmHg. IAS/Shunts: No atrial level shunt detected by color flow Doppler.  LEFT VENTRICLE PLAX 2D LVIDd:         4.60 cm      Diastology LVIDs:         3.10 cm      LV e' medial:    8.27 cm/s LV PW:         0.90 cm      LV E/e' medial:  9.1 LV IVS:        1.00 cm      LV e' lateral:   15.20 cm/s LVOT diam:     2.50 cm      LV E/e' lateral: 4.9 LV SV:         116 LV SV Index:   57 LVOT Area:     4.91 cm  LV Volumes (MOD) LV vol d, MOD A2C: 108.0 ml LV vol d, MOD A4C: 92.6 ml LV vol s, MOD A2C: 39.1 ml LV vol s, MOD A4C: 37.8 ml LV SV MOD A2C:     68.9 ml LV SV MOD A4C:     92.6 ml LV SV MOD BP:      62.7 ml RIGHT VENTRICLE             IVC RV S prime:     12.00 cm/s  IVC diam: 2.20 cm TAPSE (M-mode): 2.4 cm LEFT ATRIUM             Index        RIGHT ATRIUM           Index LA diam:        4.10 cm 2.01 cm/m   RA  Area:     11.60 cm LA Vol (A2C):   34.8 ml 17.05 ml/m  RA Volume:   24.70 ml  12.10 ml/m LA Vol (A4C):   52.9 ml 25.92 ml/m LA Biplane Vol: 45.3 ml 22.20 ml/m  AORTIC VALVE LVOT Vmax:   116.00 cm/s LVOT Vmean:  75.100 cm/s LVOT VTI:    0.237 m  AORTA Ao Root diam: 3.90 cm Ao Asc diam:   3.30 cm MITRAL VALVE MV Area (PHT): 3.99 cm    SHUNTS MV Decel Time: 190 msec    Systemic VTI:  0.24 m MV E velocity: 75.20 cm/s  Systemic Diam: 2.50 cm MV A velocity: 77.10 cm/s MV E/A ratio:  0.98 Ola Berger MD Electronically signed by Ola Berger MD Signature Date/Time: 05/22/2023/3:05:54 PM    Final    CT HEAD WO CONTRAST ( ) Result Date: 05/22/2023 CLINICAL DATA:  Stroke, follow up. EXAM: CT HEAD WITHOUT CONTRAST TECHNIQUE: Contiguous axial images were obtained from the base of the skull through the vertex without intravenous contrast. RADIATION DOSE REDUCTION: This exam was performed according to the departmental dose-optimization program which includes automated exposure control, adjustment of the mA and/or kV according to patient size and/or use of iterative reconstruction technique. COMPARISON:  CT head from earlier today. FINDINGS: Brain: Possible subtle loss of gray differentiation in the left insula and overlying frontal lobe, which may represent early changes of left MCA territory infarct. 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 No evidence of acute hemorrhage, mass lesion, midline shift or hydrocephalus Vascular: No hyperdense vessel identified. Calcific atherosclerosis. Skull: No acute fracture. Sinuses/Orbits: No acute finding. Other: Left mastoidectomy. IMPRESSION: 1. Possible subtle loss of gray differentiation in the left insula and overlying frontal lobe, which may represent early changes of left MCA territory infarct. Please see the forthcoming/ordered MRI head for more sensitive evaluation. 2. No acute hemorrhage. 3. Multiple remote infarcts, detailed above. Electronically Signed   By: Stevenson Elbe M.D.   On: 05/22/2023 03:16    Vitals:   05/22/23 1300 05/22/23 1400 05/22/23 1500 05/22/23 1600  BP: (!) 146/92 130/65 137/64 133/68  Pulse: 66 68 68 64  Resp: 15 13 18 16   Temp:    99.1 F (37.3 C)  TempSrc:    Oral   SpO2: 95% 95% 94% 94%  Weight:      Height:         PHYSICAL EXAM General:  Alert, well-nourished, well-developed patient in no acute distress Psych:  Mood and affect appropriate for situation CV: Regular rate and rhythm on monitor Respiratory:  Regular, unlabored respirations on room air GI: Abdomen soft and nontender   NEURO:  Mental Status: AA&Ox3, patient is able to give clear and coherent history Speech/Language: speech is without aphasia.  Mild dysarthria naming, repetition, fluency, and comprehension intact.  Cranial Nerves:  II: PERRL. Visual fields full.  III, IV, VI: EOMI. Eyelids elevate symmetrically.  V: Sensation is intact to light touch and symmetrical to face.  VII: Right facial droop VIII: hearing intact to voice. IX, X: Palate elevates symmetrically. Phonation is normal.  UE:[id number] shrug 5/5. XII: tongue is midline without fasciculations. Motor: 5/5 strength to all muscle groups tested.  Tone: is normal and bulk is normal Sensation-decreased on right arm Coordination: FTN intact bilaterally, HKS: no ataxia in BLE.No drift.  Gait- deferred  Most Recent NIH 4   ASSESSMENT/PLAN  Mr. KOLLEN ARMENTI is a 68 y.o. male with history of  smoking, hyperlipidemia and abdominal aortic aneurysm  aphasia, disorientation, right-sided facial droop and right-sided numbness.  Underwent thrombectomy of left internal carotid artery TICI 3 revascularization NIH on Admission 7  Acute Ischemic Infarct:  left MCA,  s/p  L ICA angioplasty with STENT PLACEMENT  and mechanical thrombectomy of left M2 with TICI 3 revascularization Etiology: Large vessel disease due to left carotid thrombus Code Stroke  CT head No acute abnormality.  Small vessel disease. ASPECTS 10.     CTA head & neck Occlusion of the left ICA from the proximal cervical segment to the proximal/mid petrous segment which is likely acute and related to prominent atherosclerotic plaque at the carotid  bifurcation. CT perfusion no core infarct and large, 107 mL penumbra.  Post IR CT no hemorrhage MRI  pending  MRA  pending  2D Echo ordered  UDS +benzos LDL 88 HgbA1c 5.8 VTE prophylaxis - Lovenox   No antithrombotic prior to admission, now on aspirin  81 mg daily and Brilinta  (ticagrelor ) 90 mg bid per IR  Therapy recommendations:  Pending Disposition:  pending   Hypertension Home meds:  none  Stable Blood Pressure Goal: SBP 120-160 for first 24 hours then less than 180   Hyperlipidemia Home meds:  atorvastatin  10mg ,  resumed in hospital LDL 88, goal < 70 Increase atorvastatin  to 40mg   Continue statin at discharge  Tobacco Abuse Patient smokes cigarettes 40 pack years        Ready to quit? No Nicotine  replacement therapy provided  Substance Abuse UDS positive for benzos       Ready to quit? No TOC consult for cessation placed  Dysphagia Patient has post-stroke dysphagia, SLP consulted    Diet   Diet Heart Room service appropriate? Yes with Assist; Fluid consistency: Thin   Advance diet as tolerated  Other Stroke Risk Factors ETOH use, alcohol level <10, advised to drink no more than 2 drink(s) a day   Other Active Problems   Hospital day # 1   Jonette Nestle DNP, ACNPC-AG  [facility]    To contact Stroke Continuity provider, please refer to [email or web address]. After hours, contact General Neurology

## 2023-05-22 NOTE — Evaluation (Signed)
 Occupational Therapy Evaluation Patient Details Name: Dale Perez MRN: [id number] DOB: 05-08-1955 Today's Date: 05/22/2023   History of Present Illness   Pt is a 68 y.o. male who presented 05/21/23 with aphasia, AMS, R-sided facial droop, and R-sided numbness. Outside the window for TNK. CTA showed L ICA occlusion. S/p mechanical thrombectomy 4/17. CT head showed possible subtle loss of gray differentiation in the left insula and overlying frontal lobe, which may represent early changes of left MCA territory infarct. MRI pending. PMH: smoking, HLD, abdominal aortic aneurysm     Clinical Impressions Patient admitted for the diagnosis above.  PTA he lives at home with his spouse, who can provide any needed assist.  Primary deficit is decreased fine motor due to impaired sensation to R hand.  Patient can use his R hand during functional tasks, but increased time and effort are needed.  Patient declines outpatient OT, so OT will follow in the acute setting to address deficits.  Red foam and foam block issued.  BEFAST stroke education provided with good understanding.          If plan is discharge home, recommend the following:   Assist for transportation     Functional Status Assessment   Patient has had a recent decline in their functional status and demonstrates the ability to make significant improvements in function in a reasonable and predictable amount of time.     Equipment Recommendations   None recommended by OT     Recommendations for Other Services         Precautions/Restrictions   Precautions Precautions: None Restrictions Weight Bearing Restrictions Per Provider Order: No     Mobility Bed Mobility Overal bed mobility: Modified Independent                  Transfers Overall transfer level: Needs assistance Equipment used: None Transfers: Sit to/from Stand Sit to Stand: Supervision                  Balance Overall balance  assessment: Mild deficits observed, not formally tested                                         ADL either performed or assessed with clinical judgement   ADL   Eating/Feeding: Set up;Sitting   Grooming: Supervision/safety;Standing               Lower Body Dressing: Supervision/safety;Sit to/from stand   Toilet Transfer: Modified Independent             General ADL Comments: Increased time and effort for dressing due to sensory impairments.  Increased difficulty with buttons, zipper and snaps.     Vision Baseline Vision/History: 1 Wears glasses Patient Visual Report: No change from baseline       Perception Perception: Within Functional Limits       Praxis Praxis: WFL       Pertinent Vitals/Pain Pain Assessment Pain Assessment: No/denies pain Pain Intervention(s): Monitored during session     Extremity/Trunk Assessment Upper Extremity Assessment Upper Extremity Assessment: Right hand dominant;RUE deficits/detail RUE Deficits / Details: Strength is functional, presets with reduced sensation to hand.  Decreased FMC, but able to use R hand in functional tasks.  Increased dropping of items noted when not attending to R hand. RUE Sensation: decreased light touch RUE Coordination: decreased fine motor   Lower Extremity Assessment Lower Extremity  Assessment: Defer to PT evaluation       Communication Communication Communication: Impaired Factors Affecting Communication: Reduced clarity of speech;Hearing impaired   Cognition Arousal: Alert Behavior During Therapy: Flat affect Cognition: No apparent impairments                               Following commands: Intact       Cueing  General Comments   Cueing Techniques: Verbal cues   VSS on RA   Exercises     Shoulder Instructions      Home Living Family/patient expects to be discharged to:: Private residence Living Arrangements: Spouse/significant  other Available Help at Discharge: Family;Available 24 hours/day Type of Home: House Home Access: Stairs to enter [employer] of Steps: 5 Entrance Stairs-Rails: Can reach both Home Layout: One level     Bathroom Shower/Tub: Tub/shower unit;Walk-in Human resources officer: Standard     Home Equipment: None      Lives With: Spouse    Prior Functioning/Environment Prior Level of Function : Independent/Modified Independent;Driving             Mobility Comments: No AD ADLs Comments: retired    Pharmacist, community Problem List: Decreased coordination;Impaired sensation   OT Treatment/Interventions: Self-care/ADL training;Therapeutic activities;Patient/family education      OT Goals(Current goals can be found in the care plan section)   Acute Rehab OT Goals Patient Stated Goal: return home OT Goal Formulation: With patient Time For Goal Achievement: 06/05/23 Potential to Achieve Goals: Good ADL Goals Pt Will Perform Grooming: Independently;standing Pt Will Perform Lower Body Dressing: Independently;sit to/from stand Pt Will Transfer to Toilet: Independently;ambulating;regular height toilet   OT Frequency:  Min 2X/week    Co-evaluation              AM-PAC OT "6 Clicks" Daily Activity     Outcome Measure Help from another person eating meals?: A Little Help from another person taking care of personal grooming?: A Little Help from another person toileting, which includes using toliet, bedpan, or urinal?: None Help from another person bathing (including washing, rinsing, drying)?: A Little Help from another person to put on and taking off regular upper body clothing?: A Little Help from another person to put on and taking off regular lower body clothing?: A Little 6 Click Score: 19   End of Session Nurse Communication: Mobility status  Activity Tolerance: Patient tolerated treatment well Patient left: in bed;with call bell/phone within reach;with family/visitor  present  OT Visit Diagnosis: Hemiplegia and hemiparesis Hemiplegia - Right/Left: Right Hemiplegia - dominant/non-dominant: Dominant Hemiplegia - caused by: Cerebral infarction                Time: 1400-1426 OT Time Calculation (min): 26 min Charges:  OT General Charges $OT Visit: 1 Visit OT Evaluation $OT Eval Moderate Complexity: 1 Mod OT Treatments $Self Care/Home Management : 8-22 mins  05/22/2023  RP, OTR/L  Acute Rehabilitation Services  Office:  [phone]   Benjamen Brand 05/22/2023, 2:33 PM

## 2023-05-22 NOTE — Progress Notes (Signed)
  Echocardiogram 2D Echocardiogram has been performed.  Dale Perez 05/22/2023, 10:59 AM

## 2023-05-22 NOTE — Progress Notes (Addendum)
 STROKE TEAM PROGRESS NOTE    SIGNIFICANT HOSPITAL EVENTS 4/17-patient had acute onset of aphasia disorientation and right-sided facial droop, right-sided numbness did not receive IV TNK due to being outside the window however did undergo thrombectomy for occluded left carotid artery with TICI 3 revascularization  INTERIM HISTORY/SUBJECTIVE Family at the bedside.  States he awoken with symptoms and he was trying to get out of bed and he fell on the floor, wife noted right side weakness and right facial droop States right side weakness is improved, still has some tingling on the right arm  Neurologic exam he is alert and oriented x 3, pupils equal and reactive, eyes are midline, tracks, lids are full, right facial droop, mild dysarthria, can follow commands, 5 out of 5 strength in all 4 extremities, sensation decreased on the right, no ataxia noted, gait tested  MRIs scheduled for 12 PM.  Will likely be able to transfer him out of the ICU to the floor  OBJECTIVE  CBC    Component Value Date/Time   WBC 10.7 (H) 05/22/2023 0434   RBC 4.75 05/22/2023 0434   HGB 13.6 05/22/2023 0434   HCT 41.9 05/22/2023 0434   PLT 232 05/22/2023 0434   MCV 88.2 05/22/2023 0434   MCH 28.6 05/22/2023 0434   MCHC 32.5 05/22/2023 0434   RDW 14.6 05/22/2023 0434   LYMPHSABS 1.3 05/22/2023 0434   MONOABS 0.5 05/22/2023 0434   EOSABS 0.0 05/22/2023 0434   BASOSABS 0.0 05/22/2023 0434    BMET    Component Value Date/Time   NA 140 05/22/2023 0434   K 4.6 05/22/2023 0434   CL 110 05/22/2023 0434   CO2 21 (L) 05/22/2023 0434   GLUCOSE 132 (H) 05/22/2023 0434   BUN 16 05/22/2023 0434   CREATININE 0.91 05/22/2023 0434   CALCIUM  8.5 (L) 05/22/2023 0434   GFRNONAA >60 05/22/2023 0434    IMAGING past 24 hours CT HEAD WO CONTRAST ( ) Result Date: 05/22/2023 CLINICAL DATA:  Stroke, follow up. EXAM: CT HEAD WITHOUT CONTRAST TECHNIQUE: Contiguous axial images were obtained from the base of the skull  through the vertex without intravenous contrast. RADIATION DOSE REDUCTION: This exam was performed according to the departmental dose-optimization program which includes automated exposure control, adjustment of the mA and/or kV according to patient size and/or use of iterative reconstruction technique. COMPARISON:  CT head from earlier today. FINDINGS: Brain: Possible subtle loss of gray differentiation in the left insula and overlying frontal lobe, which may represent early changes of left MCA territory infarct. 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 No evidence of acute hemorrhage, mass lesion, midline shift or hydrocephalus Vascular: No hyperdense vessel identified. Calcific atherosclerosis. Skull: No acute fracture. Sinuses/Orbits: No acute finding. Other: Left mastoidectomy. IMPRESSION: 1. Possible subtle loss of gray differentiation in the left insula and overlying frontal lobe, which may represent early changes of left MCA territory infarct. Please see the forthcoming/ordered MRI head for more sensitive evaluation. 2. No acute hemorrhage. 3. Multiple remote infarcts, detailed above. Electronically Signed   By: Stevenson Elbe M.D.   On: 05/22/2023 03:16   CT ANGIO HEAD NECK W WO CM W PERF (CODE STROKE) Result Date: 05/21/2023 CLINICAL DATA:  Neuro deficit, acute stroke, speech difficulty and right-sided facial numbness/droop. EXAM: CT ANGIOGRAPHY HEAD AND NECK CT PERFUSION BRAIN TECHNIQUE: Multidetector CT imaging of the head and neck was performed using the standard protocol during bolus administration of intravenous contrast. Multiplanar CT image  reconstructions and MIPs were obtained to evaluate the vascular anatomy. Carotid stenosis measurements (when applicable) are obtained utilizing NASCET criteria, using the distal internal carotid diameter as the denominator. Multiphase CT imaging of the brain was performed following IV bolus contrast  injection. Subsequent parametric perfusion maps were calculated using RAPID software. RADIATION DOSE REDUCTION: This exam was performed according to the departmental dose-optimization program which includes automated exposure control, adjustment of the mA and/or kV according to patient size and/or use of iterative reconstruction technique. CONTRAST:  OMNIPAQUE  IOHEXOL  350 MG/ML SOLN COMPARISON:  Same-day head CT. FINDINGS: CTA NECK FINDINGS Aortic arch: Standard configuration of the aortic arch. Mild atherosclerosis. Imaged portion shows no evidence of aneurysm or dissection. There is at least mild narrowing at the origin of the left common carotid artery. Otherwise no significant stenosis of the major aortic arch vessels. Pulmonary arteries: As permitted by contrast timing, there are no filling defects in the visualized pulmonary arteries. Subclavian arteries: Patent bilaterally. Atherosclerosis involving the proximal aspect of both subclavian arteries. There is noncalcified atherosclerotic plaque in the proximal left subclavian artery with mild stenosis. Right carotid system: Patent from the origin to the skull base. Mild atherosclerosis of the common carotid artery. Additional atherosclerotic plaque with irregular noncalcified component within the distal common carotid artery. Calcified atherosclerosis at the carotid bifurcation without hemodynamically significant stenosis. Left carotid system: There is at least mild narrowing at the origin of the left common carotid artery. Additional atherosclerosis along the common carotid artery. Calcified atherosclerosis at the carotid bifurcation extending into the proximal cervical ICA with likely noncalcified component. Abrupt occlusion of the proximal left cervical ICA. There is reconstitution of the distal cervical segment which is slightly diminished in caliber with significantly diminished intraluminal contrast. Vertebral arteries: Code dominant. The vertebral  arteries are patent from the origins to the vertebrobasilar confluence. Atherosclerosis involving the origins of both vertebral arteries resulting in moderate stenosis on the right and moderate to severe stenosis on the left. Skeleton: No acute findings. Degenerative changes in the cervical spine. Other neck: The visualized airway is patent. No cervical lymphadenopathy. Upper chest: Visualized lung apices are clear. Review of the MIP images confirms the above findings CTA HEAD FINDINGS ANTERIOR CIRCULATION: The left internal carotid artery is occluded at the skull base with reconstitution of the proximal/mid petrous segment. The left ICA is patent from the petrous segment to the ICA terminus. Atherosclerosis of carotid siphons. There is mild-to-moderate stenosis of the left cavernous ICA. The right ICA is patent from the skull base to the ICA terminus without significant stenosis. MCAs: The middle cerebral arteries are patent bilaterally. ACAs: Patent bilaterally. Diminutive A1 segment of the left ACA which may be congenital versus related to atherosclerosis. POSTERIOR CIRCULATION: No significant stenosis, proximal occlusion, aneurysm, or vascular malformation. PCAs: The posterior cerebral arteries are patent bilaterally. Pcomm: Not well visualized. SCAs: The superior cerebellar arteries are patent bilaterally. Basilar artery: Patent AICAs: Patent PICAs: Patent Vertebral arteries: The intracranial vertebral arteries are patent. Venous sinuses: As permitted by contrast timing, patent. Anatomic variants: None Review of the MIP images confirms the above findings CT Brain Perfusion Findings: ASPECTS: 10 CBF (<30%) Volume: 0mL Perfusion (Tmax>6.0s) volume: Mismatch Volume: Infarction Location:There is no core infarct identified on CT perfusion. Diffuse elevated T-max throughout the left MCA territory suggestive of ischemic penumbra. IMPRESSION: Occlusion of the left ICA from the proximal cervical segment to  the proximal/mid petrous segment which is likely acute and related to prominent atherosclerotic plaque at  the carotid bifurcation. Intracranial arterial vasculature is otherwise patent. No core infarct identified on CT perfusion. Large region of elevated T-max in the left MCA territory concerning for ischemic penumbra. Multifocal atherosclerosis. Focal mild-to-moderate stenosis of the left cavernous ICA. Additional atherosclerosis at the right carotid bifurcation without hemodynamically significant stenosis. There is at least mild stenosis at the origin of the left common carotid artery. Atherosclerosis at the origins of the vertebral arteries with moderate to severe stenosis on the left and moderate stenosis on the right. Aortic Atherosclerosis (ICD10-I70.0). The findings of left ICA occlusion and CT perfusion findings were called by telephone at the time of noncontrast CT interpretation on 05/21/2023 at 10:46 am to provider [facility] , who verbally acknowledged these results. Electronically Signed   By: Denny Flack M.D.   On: 05/21/2023 11:22   CT HEAD CODE STROKE WO CONTRAST Result Date: 05/21/2023 CLINICAL DATA:  Code stroke. Neuro deficit, aphasia, right-sided facial droop. EXAM: CT HEAD WITHOUT CONTRAST TECHNIQUE: Contiguous axial images were obtained from the base of the skull through the vertex without intravenous contrast. RADIATION DOSE REDUCTION: This exam was performed according to the departmental dose-optimization program which includes automated exposure control, adjustment of the mA and/or kV according to patient size and/or use of iterative reconstruction technique. COMPARISON:  CT head 04/09/2019. FINDINGS: Brain: No acute intracranial hemorrhage. No CT evidence of acute infarct. Nonspecific hypoattenuation in the periventricular and subcortical white matter favored to reflect chronic microvascular ischemic changes. Remote infarct involving the right caudate head and anterior limb of  the internal capsule. Additional small remote lacunar infarct in the left thalamus. Remote cortical/subcortical infarcts in the posterior left frontal lobe and left parietal lobe. No edema, mass effect, or midline shift. The basilar cisterns are patent. Ventricles: The ventricles are normal. Vascular: Atherosclerotic calcifications of the carotid siphons. No hyperdense vessel. Skull: No acute or aggressive finding. Orbits: Orbits are symmetric. Sinuses: Mild mucosal thickening in the ethmoid sinuses. Other: Right mastoid air cells are clear. Sequelae of left mastoidectomy, likely canal wall down. ASPECTS [facility] Stroke Program Early CT Score) - Ganglionic level infarction (caudate, lentiform nuclei, internal capsule, insula, M1-M3 cortex): 7 - Supraganglionic infarction (M4-M6 cortex): 3 Total score (0-10 with 10 being normal): 10 IMPRESSION: 1. No CT evidence of acute intracranial abnormality. 2. Multiple remote infarcts as above. Mild chronic microvascular ischemic changes. 3. ASPECTS is 10 These results were called by telephone at the time of interpretation on 05/21/2023 at 10:46 Am to provider Dr. Shane Darling, who verbally acknowledged these results. Electronically Signed   By: Denny Flack M.D.   On: 05/21/2023 11:03    Vitals:   05/22/23 0700 05/22/23 0800 05/22/23 0806 05/22/23 0900  BP: 131/71 128/77  132/83  Pulse: 69 80  72  Resp: 14 20  13   Temp:   99 F (37.2 C)   TempSrc:   Oral   SpO2: 94% 94%  96%  Weight:      Height:         PHYSICAL EXAM General:  Alert, well-nourished, well-developed patient in no acute distress Psych:  Mood and affect appropriate for situation CV: Regular rate and rhythm on monitor Respiratory:  Regular, unlabored respirations on room air GI: Abdomen soft and nontender   NEURO:  Mental Status: AA&Ox3, patient is able to give clear and coherent history Speech/Language: speech is without aphasia.  Mild dysarthria naming, repetition, fluency, and  comprehension intact.  Cranial Nerves:  II: PERRL. Visual fields full.  III, IV,  VI: EOMI. Eyelids elevate symmetrically.  V: Sensation is intact to light touch and symmetrical to face.  VII: Right facial droop VIII: hearing intact to voice. IX, X: Palate elevates symmetrically. Phonation is normal.  WU:[id number] shrug 5/5. XII: tongue is midline without fasciculations. Motor: 5/5 strength to all muscle groups tested.  Tone: is normal and bulk is normal Sensation-decreased on right arm Coordination: FTN intact bilaterally, HKS: no ataxia in BLE.No drift.  Gait- deferred  Most Recent NIH 4   ASSESSMENT/PLAN  Dale Perez is a 68 y.o. male with history of  smoking, hyperlipidemia and abdominal aortic aneurysm  aphasia, disorientation, right-sided facial droop and right-sided numbness.  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  Small vessel disease. ASPECTS 10.     CTA head & neck Occlusion of the left ICA from the proximal cervical segment to the proximal/mid petrous segment which is likely acute and related to prominent atherosclerotic plaque at the carotid bifurcation. CT perfusion no core infarct and large, 107 mL penumbra.  S/p IR with left ICA stenting, then complicated with left M2 occlusion s/p TICI3 reperfusion Post IR CT no hemorrhage MRI Scattered foci of acute infarction in the left MCA territory with the largest area along the left central sulcus. Single focus of acute infarction in the right caudate nucleus. MRA no LVO 2D Echo EF 60-65% UDS +benzos LDL 88 HgbA1c 5.8 VTE prophylaxis - Lovenox   No antithrombotic prior to admission, now on aspirin  81 mg daily and Brilinta  (ticagrelor ) 90 mg bid per IR   Therapy recommendations:  none  Disposition:  pending   Hypertension Home meds:  none  Stable Long term BP goal normotensive  Hyperlipidemia Home meds:  atorvastatin  10mg   LDL 88, goal < 70 Increase atorvastatin  to 40mg   Continue statin at discharge  Tobacco abuse Current smoker Smoking cessation counseling provided Pt is willing to quit  Other Stroke Risk Factors ETOH use, alcohol level <10, advised to drink no more than 2 drink(s) a day Advanced age  Other Active Problems AAA - stable   Hospital day # 1   Jonette Nestle DNP, ACNPC-AG  [facility]  ATTENDING NOTE: I reviewed above note and agree with the assessment and plan. Pt was seen and examined.   Wife and daughter at the bedside. Pt still has right facial droop and dysarthria but no significant aphasia, moving all extremities. No ataxia. MRA showed left MCA patent and left ICA patent. Etiology likely due to large vessel disease given uncontrolled risk factors. Smoking cessation education provided. Now on ASA and brilinta  and lipitor dose increased. PT and OT no recs.   For detailed assessment and plan, please refer to above as I have made changes wherever appropriate.   Consuelo Denmark, MD PhD Stroke Neurology 05/22/2023 10:40 PM  This patient is critically ill due to stroke s/p IR and ICA stenting and at significant risk of neurological worsening, death form recurrent stroke, stent reocclusion. This patient's care requires constant monitoring of vital signs, hemodynamics, respiratory and cardiac monitoring, review of multiple databases, neurological assessment, discussion with family, other specialists and medical decision making of high complexity. I spent 40 minutes of neurocritical care time in the care of this patient. I had long  discussion with wife and pt at bedside, updated pt current condition, treatment plan and potential prognosis, and answered all the questions. They expressed understanding and  appreciation.      To contact Stroke Continuity provider, please refer to [email or web address]. After hours, contact General Neurology

## 2023-05-22 NOTE — Evaluation (Signed)
 Physical Therapy Evaluation & Discharge Patient Details Name: Dale Perez MRN: [id number] DOB: [birth year] Today's Date: 05/22/2023  History of Present Illness  Pt is a 68 y.o. male who presented 05/21/23 with aphasia, AMS, R-sided facial droop, and R-sided numbness. Outside the window for TNK. CTA showed L ICA occlusion. S/p mechanical thrombectomy 4/17. CT head showed possible subtle loss of gray differentiation in the left insula and overlying frontal lobe, which may represent early changes of left MCA territory infarct. MRI pending. PMH: smoking, HLD, abdominal aortic aneurysm   Clinical Impression  Pt presents with condition above. PTA, he was independent without DME, living with his wife in a 1-level house with 5 STE. Currently, the pt demonstrates a R facial droop along with deficits in clarity of speech and in R hand sensation. He also demonstrates a delay in dynamic proprioception in bil lower extremities, R>L, but this does not appear to impact his balance, supported by him scoring 23/24 on the DGI. A score of > 22/24 on the DGI is indicative of a safe ambulator. He appears to be capable of independent functional mobility and not needing any DME at this time. He appears to be performing all functional mobility near if not at his baseline. No further acute PT needs identified nor any need for follow-up PT. All education completed and questions answered. PT will sign off. Will defer deficits in communication and R hand sensation to SLP and OT respectively.      If plan is discharge home, recommend the following: Assistance with cooking/housework   Can travel by private vehicle        Equipment Recommendations None recommended by PT  Recommendations for Other Services       Functional Status Assessment Patient has had a recent decline in their functional status and demonstrates the ability to make significant improvements in function in a reasonable and predictable amount of time.      Precautions / Restrictions Precautions Precautions: None Restrictions Weight Bearing Restrictions Per Provider Order: No      Mobility  Bed Mobility Overal bed mobility: Modified Independent             General bed mobility comments: HOB elevated, no assistance needed    Transfers Overall transfer level: Needs assistance Equipment used: None Transfers: Sit to/from Stand Sit to Stand: Supervision           General transfer comment: Supervision for safety but no LOB noted, likely could be independent level    Ambulation/Gait Ambulation/Gait assistance: Supervision Gait Distance (Feet): 340 Feet Assistive device: None Gait Pattern/deviations: WFL(Within Functional Limits) Gait velocity: WFL Gait velocity interpretation: >2.62 ft/sec, indicative of community ambulatory   General Gait Details: No drastic gait deviations noted. Able to withstand perturbations and challenges of DGI without LOB. Supervision for safety but capable of being independent  Stairs Stairs: Yes Stairs assistance: Supervision Stair Management: No rails, Alternating pattern, Forwards Number of Stairs: 10 General stair comments: Ascends and descends stairs without LOB, supervision for safety  Wheelchair Mobility     Tilt Bed    Modified Rankin (Stroke Patients Only) Modified Rankin (Stroke Patients Only) Pre-Morbid Rankin Score: No symptoms Modified Rankin: Slight disability     Balance Overall balance assessment: No apparent balance deficits (not formally assessed)                               Standardized Balance Assessment Standardized Balance Assessment : Dynamic  Gait Index   Dynamic Gait Index Level Surface: Normal Change in Gait Speed: Normal Gait with Horizontal Head Turns: Normal Gait with Vertical Head Turns: Mild Impairment Gait and Pivot Turn: Normal Step Over Obstacle: Normal Step Around Obstacles: Normal Steps: Normal Total Score: 23        Pertinent Vitals/Pain Pain Assessment Pain Assessment: Faces Faces Pain Scale: Hurts little more Pain Location: hips, R groin Pain Descriptors / Indicators: Discomfort, Grimacing, Sore Pain Intervention(s): Limited activity within patient's tolerance, Monitored during session    Home Living Family/patient expects to be discharged to:: Private residence Living Arrangements: Spouse/significant other Available Help at Discharge: Family;Available 24 hours/day Type of Home: House Home Access: Stairs to enter Entrance Stairs-Rails: Can reach both Entrance Stairs-Number of Steps: 5   Home Layout: One level Home Equipment: Other (comment) (likely has some DME from family members but unsure of which DME)      Prior Function Prior Level of Function : Independent/Modified Independent;Driving             Mobility Comments: No AD ADLs Comments: retired     Extremity/Trunk Assessment   Upper Extremity Assessment Upper Extremity Assessment: Defer to OT evaluation;Right hand dominant    Lower Extremity Assessment Lower Extremity Assessment: RLE deficits/detail;LLE deficits/detail RLE Deficits / Details: delayed dynamic proprioception in ankle compared to L, potentially also in knee but difficult to tell due to delay bil, accurate though; MMT scores of 5/5 grossly bil; sensation intact bil; coordination intact bil RLE Sensation: decreased proprioception (R>L) RLE Coordination: WNL LLE Deficits / Details: delay noted bil in dynamic proprioception in ankle and knee; MMT scores of 5/5 grossly bil; sensation intact bil; coordination intact bil LLE Sensation: decreased proprioception LLE Coordination: WNL    Cervical / Trunk Assessment Cervical / Trunk Assessment: Normal  Communication   Communication Communication: Impaired Factors Affecting Communication: Reduced clarity of speech;Hearing impaired (cannot hear in L ear, hearing aid in R)    Cognition Arousal:  Alert Behavior During Therapy: WFL for tasks assessed/performed   PT - Cognitive impairments: No apparent impairments                       PT - Cognition Comments: follows commands appropriately Following commands: Intact       Cueing Cueing Techniques: Verbal cues     General Comments General comments (skin integrity, edema, etc.): VSS on RA throughout; groin site observed prior to and post mobility with no changes noted    Exercises     Assessment/Plan    PT Assessment Patient does not need any further PT services  PT Problem List         PT Treatment Interventions      PT Goals (Current goals can be found in the Care Plan section)  Acute Rehab PT Goals Patient Stated Goal: to go home PT Goal Formulation: All assessment and education complete, DC therapy Time For Goal Achievement: 05/23/23 Potential to Achieve Goals: Good    Frequency       Co-evaluation               AM-PAC PT 6 Clicks Mobility  Outcome Measure Help needed turning from your back to your side while in a flat bed without using bedrails?: None Help needed moving from lying on your back to sitting on the side of a flat bed without using bedrails?: None Help needed moving to and from a bed to a chair (including a wheelchair)?: A Little  Help needed standing up from a chair using your arms (e.g., wheelchair or bedside chair)?: A Little Help needed to walk in hospital room?: A Little Help needed climbing 3-5 steps with a railing? : A Little 6 Click Score: 20    End of Session Equipment Utilized During Treatment: Gait belt Activity Tolerance: Patient tolerated treatment well Patient left: in chair;with call bell/phone within reach;with chair alarm set;with family/visitor present Nurse Communication: Mobility status PT Visit Diagnosis: Other symptoms and signs involving the nervous system (R29.898)    Time: 9099-9077 PT Time Calculation (min) (ACUTE ONLY): 22 min   Charges:    PT Evaluation $PT Eval Low Complexity: 1 Low   PT General Charges $$ ACUTE PT VISIT: 1 Visit         Theo Ferretti, PT, DPT Acute Rehabilitation Services  Office: [phone]   Theo CHRISTELLA Ferretti 05/22/2023, 9:33 AM

## 2023-05-22 NOTE — Telephone Encounter (Signed)
 Patient Product/process development scientist completed.    The patient is insured through [employer]. Patient has Medicare and is not eligible for a copay card, but may be able to apply for patient assistance or Medicare RX Payment Plan (Patient Must reach out to their plan, if eligible for payment plan), if available.    Ran test claim for Brilinta  90 mg and the current 30 day co-pay is $40.00.   This test claim was processed through [facility]- copay amounts may vary at other pharmacies due to pharmacy/plan contracts, or as the patient moves through the different stages of their insurance plan.     Morgan Arab, CPHT Pharmacy Technician III Certified Patient Advocate [facility] Patient Advocate Team Direct Number: [phone]  Fax: [phone]

## 2023-05-22 NOTE — Evaluation (Signed)
 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 Repeat in 2020.    COLONOSCOPY N/A 09/09/2019   Procedure: COLONOSCOPY;  Surgeon: Shaaron Lamar HERO, MD;  Location: [facility];  Service: Endoscopy;  Laterality: N/A;  9:45am   I & D EXTREMITY Left 08/10/2015   Procedure: IRRIGATION AND DEBRIDEMENT EXTREMITY;  Surgeon: Alm Hummer, MD;  Location: [facility] OR;  Service: Orthopedics;  Laterality: Left;   KNEE CARTILAGE SURGERY Right 02/2019   KNEE SURGERY     RADIOLOGY WITH ANESTHESIA N/A 05/21/2023   Procedure: RADIOLOGY WITH ANESTHESIA;  Surgeon: Radiologist, Medication, MD;  Location: [facility] OR;  Service: Radiology;  Laterality: N/A;   SHOULDER ARTHROSCOPY Left 08/2018   SHOULDER SURGERY     TENDON REPAIR N/A 08/10/2015   Procedure: TENDON REPAIR WITH WOUND CLOSURE;  Surgeon: Alm Hummer, MD;  Location: [facility] OR;  Service: Orthopedics;  Laterality: N/A;   TONSILLECTOMY     HPI:  Pt is a 68 y.o. male who presented 05/21/23 with aphasia, AMS, R-sided facial droop, and R-sided numbness. Outside the window for TNK. CTA showed L ICA occlusion. S/p mechanical thrombectomy 4/17. CT head showed possible subtle  loss of gray differentiation in the left insula and overlying frontal lobe, which may represent early changes of left MCA territory infarct. MRI pending. PMH: smoking, HLD, abdominal aortic aneurysm   Assessment / Plan / Recommendation Clinical Impression  Pt seen for speech/language evaluation. Pt presents with s/sx mild-moderate flaccid dysarthria. Speech intelligibility in ~85% across all levels to an unfamiliar listener due to auditory-perceptual characteristics of imprecise articulation and occasional fast rate of speech. Pt benefits from verbal cues to slow down, speak louder, and overarticulate. Notable R orofacial weakness appreciated during oral motor examination. Pt demonstrated relatively intact auditory comprehension with difficulty with complex/multistep commands and need for occasional repetition of auditory stimuli. Suspect hearing status affecting performance on auditory comprehension tasks. With exception of mild difficulty with generative naming, pt's expressive language appeared [facility]. Occasional tearfulness and flat affect appreciated. Pt A&Ox4. Pt with verbal awareness of CLOF. Full cognitive-linguistic evaluation not completed at this time; however, cognitive ability appeared [facility] for participation in speech/language evaluation. Further assessment warranted. SLP to f/u per POC while pt admitted. Recommend OP SLP services for above mentioned deficits.    SLP Assessment  SLP Recommendation/Assessment: Patient needs continued Speech Lanaguage Pathology Services SLP Visit Diagnosis: Dysarthria and anarthria (R47.1);Cognitive communication deficit (R41.841)    Recommendations for follow up therapy are one component of a multi-disciplinary discharge planning process, led by the attending physician.  Recommendations may be updated based on patient status, additional functional criteria and insurance authorization.    Follow Up Recommendations  Outpatient SLP    Assistance Recommended at  Discharge  Intermittent Supervision/Assistance  Functional Status Assessment Patient has had a recent decline in their functional status and demonstrates the ability to make significant improvements in function in a reasonable and predictable amount of time.  Frequency and Duration min 2x/week  2 weeks      SLP Evaluation Cognition  Overall Cognitive Status: Within Functional Limits for tasks assessed Orientation Level: Oriented X4       Comprehension  Auditory Comprehension Overall Auditory Comprehension: Impaired Yes/No Questions: Within Functional Limits Commands: Impaired Two Step Basic Commands: 50-74% accurate Multistep Basic Commands: 50-74% accurate Other Conversation Comments: occasional repetition of stimuli Interfering Components: Hearing EffectiveTechniques: Extra processing time;Repetition Visual Recognition/Discrimination Discrimination: Not tested Reading Comprehension Reading Status: Not tested    Expression Expression Primary Mode of Expression: Verbal Verbal Expression Overall Verbal Expression: Appears within functional limits for tasks assessed Initiation: No impairment Automatic Speech: Name;Social Response Level of Generative/Spontaneous Verbalization: Sentence;Conversation Repetition: No impairment Naming: Impairment Confrontation: Within functional limits Divergent:  (10 animals in 60s; mildly reduced) Pragmatics: Impairment Impairments: Abnormal affect (occasional tearfulness) Interfering Components: Speech intelligibility Written Expression Dominant Hand: Right Written Expression: Not tested   Oral / Motor  Oral Motor/Sensory Function Overall Oral Motor/Sensory Function: Moderate impairment Facial ROM: Reduced right Facial Symmetry: Abnormal symmetry right Facial Strength: Reduced right Facial Sensation: Reduced right Lingual ROM: Reduced right Lingual Symmetry: Within Functional Limits Lingual Strength: Reduced Velum: Within Functional  Limits Mandible: Within Functional Limits Motor Speech Overall Motor Speech: Impaired Respiration: Within functional limits Phonation: Normal Resonance: Within functional limits Articulation: Impaired Level of Impairment:  (all levels) Intelligibility: Intelligibility reduced Word: 75-100% accurate Phrase: 75-100% accurate Sentence: 75-100% accurate Conversation: 75-100% accurate Motor Planning: Witnin functional limits Effective Techniques: Slow rate;Increased vocal intensity;Over-articulate           Delon Bangs, M.S., CCC-SLP Speech-Language Pathologist Secure Chat Preferred  O: [phone]   Delon CHRISTELLA Bangs 05/22/2023, 10:08 AM

## 2023-05-22 NOTE — Progress Notes (Cosign Needed Addendum)
 Referring Physician(s): Khaliqdina, Salman  Supervising Physician: Luellen Sages  Patient Status:  [facility] - In-pt  Chief Complaint:  Code stroke aphasia and mild right-sided numbness, occluded left internal carotid artery proximally S/p  L ICA angioplasty with STENT PLACEMENT by Dr. Alvira Josephs  on 05/21/23. Piece of clot floated to L MCA, thrombectomy performed and TICI 3 achieved.   Subjective:  Patient laying in bed, NAD, wife at bedside.  States that he is doing fine, only residual symptom is right side numbness.   Allergies: Penicillins, Quinolones, and Aspirin   Medications: Prior to Admission medications   Medication Sig Start Date End Date Taking? Authorizing Provider  ALEVE 220 MG tablet Take 440 mg by mouth in the morning.   Yes [provider]  atorvastatin  (LIPITOR) 10 MG tablet Take 10 mg by mouth daily.   Yes [provider]     Vital Signs: BP 128/77   Pulse 80   Temp 99 F (37.2 C) (Oral)   Resp 20   Ht 6\' 1"  (1.854 m)   Wt 176 lb 9.4 oz (80.1 kg)   SpO2 94%   BMI 23.30 kg/m   Physical Exam Vitals reviewed.  Constitutional:      General: He is not in acute distress.    Appearance: He is not ill-appearing.  HENT:     Head: Normocephalic and atraumatic.  Cardiovascular:     Rate and Rhythm: Normal rate and regular rhythm.  Pulmonary:     Effort: Pulmonary effort is normal.  Abdominal:     General: Abdomen is flat.     Palpations: Abdomen is soft.  Skin:    General: Skin is warm and dry.     Coloration: Skin is not jaundiced or pale.     Findings: No bruising.  Neurological:     Mental Status: He is alert and oriented to person, place, and time.     Comments: Right facial droop Speech intact Strength 5/5 all 4 Right CFA puncture site clean and dry, mild TTP, R DP 2+   Psychiatric:        Mood and Affect: Mood normal.        Behavior: Behavior normal.     Imaging: CT HEAD WO CONTRAST ( ) Result Date:  05/22/2023 CLINICAL DATA:  Stroke, follow up. EXAM: CT HEAD WITHOUT CONTRAST TECHNIQUE: Contiguous axial images were obtained from the base of the skull through the vertex without intravenous contrast. RADIATION DOSE REDUCTION: This exam was performed according to the departmental dose-optimization program which includes automated exposure control, adjustment of the mA and/or kV according to patient size and/or use of iterative reconstruction technique. COMPARISON:  CT head from earlier today. FINDINGS: Brain: Possible subtle loss of gray differentiation in the left insula and overlying frontal lobe, which may represent early changes of left MCA territory infarct. 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 No evidence of acute hemorrhage, mass lesion, midline shift or hydrocephalus Vascular: No hyperdense vessel identified. Calcific atherosclerosis. Skull: No acute fracture. Sinuses/Orbits: No acute finding. Other: Left mastoidectomy. IMPRESSION: 1. Possible subtle loss of gray differentiation in the left insula and overlying frontal lobe, which may represent early changes of left MCA territory infarct. Please see the forthcoming/ordered MRI head for more sensitive evaluation. 2. No acute hemorrhage. 3. Multiple remote infarcts, detailed above. Electronically Signed   By: Stevenson Elbe M.D.   On: 05/22/2023 03:16   CT ANGIO HEAD NECK W WO CM W  PERF (CODE STROKE) Result Date: 05/21/2023 CLINICAL DATA:  Neuro deficit, acute stroke, speech difficulty and right-sided facial numbness/droop. EXAM: CT ANGIOGRAPHY HEAD AND NECK CT PERFUSION BRAIN TECHNIQUE: Multidetector CT imaging of the head and neck was performed using the standard protocol during bolus administration of intravenous contrast. Multiplanar CT image reconstructions and MIPs were obtained to evaluate the vascular anatomy. Carotid stenosis measurements (when applicable) are obtained utilizing  NASCET criteria, using the distal internal carotid diameter as the denominator. Multiphase CT imaging of the brain was performed following IV bolus contrast injection. Subsequent parametric perfusion maps were calculated using RAPID software. RADIATION DOSE REDUCTION: This exam was performed according to the departmental dose-optimization program which includes automated exposure control, adjustment of the mA and/or kV according to patient size and/or use of iterative reconstruction technique. CONTRAST:  OMNIPAQUE  IOHEXOL  350 MG/ML SOLN COMPARISON:  Same-day head CT. FINDINGS: CTA NECK FINDINGS Aortic arch: Standard configuration of the aortic arch. Mild atherosclerosis. Imaged portion shows no evidence of aneurysm or dissection. There is at least mild narrowing at the origin of the left common carotid artery. Otherwise no significant stenosis of the major aortic arch vessels. Pulmonary arteries: As permitted by contrast timing, there are no filling defects in the visualized pulmonary arteries. Subclavian arteries: Patent bilaterally. Atherosclerosis involving the proximal aspect of both subclavian arteries. There is noncalcified atherosclerotic plaque in the proximal left subclavian artery with mild stenosis. Right carotid system: Patent from the origin to the skull base. Mild atherosclerosis of the common carotid artery. Additional atherosclerotic plaque with irregular noncalcified component within the distal common carotid artery. Calcified atherosclerosis at the carotid bifurcation without hemodynamically significant stenosis. Left carotid system: There is at least mild narrowing at the origin of the left common carotid artery. Additional atherosclerosis along the common carotid artery. Calcified atherosclerosis at the carotid bifurcation extending into the proximal cervical ICA with likely noncalcified component. Abrupt occlusion of the proximal left cervical ICA. There is reconstitution of the distal  cervical segment which is slightly diminished in caliber with significantly diminished intraluminal contrast. Vertebral arteries: Code dominant. The vertebral arteries are patent from the origins to the vertebrobasilar confluence. Atherosclerosis involving the origins of both vertebral arteries resulting in moderate stenosis on the right and moderate to severe stenosis on the left. Skeleton: No acute findings. Degenerative changes in the cervical spine. Other neck: The visualized airway is patent. No cervical lymphadenopathy. Upper chest: Visualized lung apices are clear. Review of the MIP images confirms the above findings CTA HEAD FINDINGS ANTERIOR CIRCULATION: The left internal carotid artery is occluded at the skull base with reconstitution of the proximal/mid petrous segment. The left ICA is patent from the petrous segment to the ICA terminus. Atherosclerosis of carotid siphons. There is mild-to-moderate stenosis of the left cavernous ICA. The right ICA is patent from the skull base to the ICA terminus without significant stenosis. MCAs: The middle cerebral arteries are patent bilaterally. ACAs: Patent bilaterally. Diminutive A1 segment of the left ACA which may be congenital versus related to atherosclerosis. POSTERIOR CIRCULATION: No significant stenosis, proximal occlusion, aneurysm, or vascular malformation. PCAs: The posterior cerebral arteries are patent bilaterally. Pcomm: Not well visualized. SCAs: The superior cerebellar arteries are patent bilaterally. Basilar artery: Patent AICAs: Patent PICAs: Patent Vertebral arteries: The intracranial vertebral arteries are patent. Venous sinuses: As permitted by contrast timing, patent. Anatomic variants: None Review of the MIP images confirms the above findings CT Brain Perfusion Findings: ASPECTS: 10 CBF (<30%) Volume: 0mL Perfusion (Tmax>6.0s) volume: Mismatch  Volume: Infarction Location:There is no core infarct identified on CT perfusion. Diffuse  elevated T-max throughout the left MCA territory suggestive of ischemic penumbra. IMPRESSION: Occlusion of the left ICA from the proximal cervical segment to the proximal/mid petrous segment which is likely acute and related to prominent atherosclerotic plaque at the carotid bifurcation. Intracranial arterial vasculature is otherwise patent. No core infarct identified on CT perfusion. Large region of elevated T-max in the left MCA territory concerning for ischemic penumbra. Multifocal atherosclerosis. Focal mild-to-moderate stenosis of the left cavernous ICA. Additional atherosclerosis at the right carotid bifurcation without hemodynamically significant stenosis. There is at least mild stenosis at the origin of the left common carotid artery. Atherosclerosis at the origins of the vertebral arteries with moderate to severe stenosis on the left and moderate stenosis on the right. Aortic Atherosclerosis (ICD10-I70.0). The findings of left ICA occlusion and CT perfusion findings were called by telephone at the time of noncontrast CT interpretation on 05/21/2023 at 10:46 am to provider [facility] , who verbally acknowledged these results. Electronically Signed   By: Denny Flack M.D.   On: 05/21/2023 11:22   CT HEAD CODE STROKE WO CONTRAST Result Date: 05/21/2023 CLINICAL DATA:  Code stroke. Neuro deficit, aphasia, right-sided facial droop. EXAM: CT HEAD WITHOUT CONTRAST TECHNIQUE: Contiguous axial images were obtained from the base of the skull through the vertex without intravenous contrast. RADIATION DOSE REDUCTION: This exam was performed according to the departmental dose-optimization program which includes automated exposure control, adjustment of the mA and/or kV according to patient size and/or use of iterative reconstruction technique. COMPARISON:  CT head 04/09/2019. FINDINGS: Brain: No acute intracranial hemorrhage. No CT evidence of acute infarct. Nonspecific hypoattenuation in the periventricular  and subcortical white matter favored to reflect chronic microvascular ischemic changes. Remote infarct involving the right caudate head and anterior limb of the internal capsule. Additional small remote lacunar infarct in the left thalamus. Remote cortical/subcortical infarcts in the posterior left frontal lobe and left parietal lobe. No edema, mass effect, or midline shift. The basilar cisterns are patent. Ventricles: The ventricles are normal. Vascular: Atherosclerotic calcifications of the carotid siphons. No hyperdense vessel. Skull: No acute or aggressive finding. Orbits: Orbits are symmetric. Sinuses: Mild mucosal thickening in the ethmoid sinuses. Other: Right mastoid air cells are clear. Sequelae of left mastoidectomy, likely canal wall down. ASPECTS [facility] Stroke Program Early CT Score) - Ganglionic level infarction (caudate, lentiform nuclei, internal capsule, insula, M1-M3 cortex): 7 - Supraganglionic infarction (M4-M6 cortex): 3 Total score (0-10 with 10 being normal): 10 IMPRESSION: 1. No CT evidence of acute intracranial abnormality. 2. Multiple remote infarcts as above. Mild chronic microvascular ischemic changes. 3. ASPECTS is 10 These results were called by telephone at the time of interpretation on 05/21/2023 at 10:46 Am to provider Dr. Shane Darling, who verbally acknowledged these results. Electronically Signed   By: Denny Flack M.D.   On: 05/21/2023 11:03    Labs:  CBC: Recent Labs    05/21/23 1059 05/21/23 1102 05/22/23 0434  WBC 8.8  --  10.7*  HGB 16.5 17.0 13.6  HCT 51.1 50.0 41.9  PLT 242  --  232    COAGS: Recent Labs    05/21/23 1059  INR 1.1  APTT 28    BMP: Recent Labs    05/21/23 1059 05/21/23 1102 05/22/23 0434  NA 134* 139 140  K 4.2 4.6 4.6  CL 103 104 110  CO2 23  --  21*  GLUCOSE 105* 100* 132*  BUN 20 21 16   CALCIUM  8.2*  --  8.5*  CREATININE 0.91 1.00 0.91  GFRNONAA >60  --  >60    LIVER FUNCTION TESTS: Recent Labs     05/21/23 1059 05/22/23 0434  BILITOT 0.4 0.4  AST 18 18  ALT 21 20  ALKPHOS 89 75  PROT 5.8* 5.7*  ALBUMIN 3.2* 3.1*    Assessment and Plan:  68 y.o. male with PMH AAA and HLD who presented with acute onset of aphasia and mild right-sided numbness, Code stroke initiated, found to have occluded left internal carotid artery proximally, s/p L ICA angioplasty with stent placement by Dr. Alvira Josephs  on 05/21/23. Piece of clot floated to L MCA, thrombectomy performed and TICI 3 achieved.   VSS CBC WBC 10.7, wnl otherwise  RF wnl  On ASA 81 mg and Brilinta  90 mg, must remain on DAPT for 6 month minimum  Right CFA puncture site with no appreciable pseudoaneurysm or hematoma, R DP 2 +  PLAN - patient will need follow up visit with Dr. Alvira Josephs 2 weeks after d/c, order placed - Must remain on DAPT for minimum on 6 month - No bending, stooping, lifting more than 10 pounds for 2 week  Further treatment plan per stroke team Appreciate and agree with the plan.  Please call NIR for questions and concerns.     Electronically Signed: Darel Ebbs, PA-C 05/22/2023, 9:45 AM   I spent a total of 15 Minutes at the the patient's bedside AND on the patient's hospital floor or unit, greater than 50% of which was counseling/coordinating care for left ICA thrombectomy f/u.   This chart was dictated using voice recognition software.  Despite best efforts to proofread,  errors can occur which can change the documentation meaning.

## 2023-05-23 ENCOUNTER — Encounter (HOSPITAL_COMMUNITY): Payer: Self-pay | Admitting: Neurology

## 2023-05-23 ENCOUNTER — Other Ambulatory Visit (HOSPITAL_COMMUNITY): Payer: Self-pay

## 2023-05-23 DIAGNOSIS — R29703 NIHSS score 3: Secondary | ICD-10-CM | POA: Diagnosis not present

## 2023-05-23 DIAGNOSIS — I10 Essential (primary) hypertension: Secondary | ICD-10-CM

## 2023-05-23 DIAGNOSIS — I63232 Cerebral infarction due to unspecified occlusion or stenosis of left carotid arteries: Secondary | ICD-10-CM | POA: Diagnosis not present

## 2023-05-23 DIAGNOSIS — F17200 Nicotine dependence, unspecified, uncomplicated: Secondary | ICD-10-CM | POA: Insufficient documentation

## 2023-05-23 DIAGNOSIS — E785 Hyperlipidemia, unspecified: Secondary | ICD-10-CM | POA: Insufficient documentation

## 2023-05-23 HISTORY — DX: Essential (primary) hypertension: I10

## 2023-05-23 MED ORDER — ASPIRIN 81 MG PO CHEW
81.0000 mg | CHEWABLE_TABLET | Freq: Every day | ORAL | 0 refills | Status: AC
  Filled 2023-05-23: qty 30, 30d supply, fill #0

## 2023-05-23 MED ORDER — TICAGRELOR 90 MG PO TABS
90.0000 mg | ORAL_TABLET | Freq: Two times a day (BID) | ORAL | 1 refills | Status: DC
  Filled 2023-05-23: qty 60, 30d supply, fill #0

## 2023-05-23 MED ORDER — ATORVASTATIN CALCIUM 40 MG PO TABS
40.0000 mg | ORAL_TABLET | Freq: Every day | ORAL | 1 refills | Status: AC
  Filled 2023-05-23: qty 30, 30d supply, fill #0

## 2023-05-23 NOTE — Progress Notes (Signed)
 Patient and wife both verbalized understanding of dc instructions. All belongings and paperwork given to patient.Pharmacy here educated patient and wife about Brillinta and other meds.

## 2023-05-23 NOTE — Progress Notes (Signed)
 Referring Physician(s): Khaliqdina,S  Supervising Physician: Luellen Sages  Patient Status:  Dale Perez - In-pt  Chief Complaint: Code stroke aphasia and mild right-sided numbness, occluded left internal carotid artery proximally S/p revascularization of occluded left ICA proximally and of a branch of the superior division of the left MCA on 05/21/23.    Subjective: Patient doing fairly well; still complaining of some tingling in fingers of right hand; right facial droop persists   Past Medical History:  Diagnosis Date   AAA (abdominal aortic aneurysm) (HCC) 04/2019   4.4cm   Past Surgical History:  Procedure Laterality Date   APPENDECTOMY     COLONOSCOPY  11/2008   Rourk; sigmoid diverticula, otherwise normal exam. Repeat in 2020.    COLONOSCOPY N/A 09/09/2019   Procedure: COLONOSCOPY;  Surgeon: Suzette Espy, MD;  Location: [facility];  Service: Endoscopy;  Laterality: N/A;  9:45am   I & D EXTREMITY Left 08/10/2015   Procedure: IRRIGATION AND DEBRIDEMENT EXTREMITY;  Surgeon: Rober Chimera, MD;  Location: [facility] OR;  Service: Orthopedics;  Laterality: Left;   KNEE CARTILAGE SURGERY Right 02/2019   KNEE SURGERY     RADIOLOGY WITH ANESTHESIA N/A 05/21/2023   Procedure: RADIOLOGY WITH ANESTHESIA;  Surgeon: Radiologist, Medication, MD;  Location: [facility] OR;  Service: Radiology;  Laterality: N/A;   SHOULDER ARTHROSCOPY Left 08/2018   SHOULDER SURGERY     TENDON REPAIR N/A 08/10/2015   Procedure: TENDON REPAIR WITH WOUND CLOSURE;  Surgeon: Rober Chimera, MD;  Location: [facility] OR;  Service: Orthopedics;  Laterality: N/A;   TONSILLECTOMY        Allergies: Penicillins, Quinolones, and Aspirin   Medications: Prior to Admission medications   Medication Sig Start Date End Date Taking? Authorizing Provider  ALEVE 220 MG tablet Take 440 mg by mouth in the morning.   Yes [provider]  atorvastatin  (LIPITOR) 10 MG tablet Take 10 mg by mouth daily.   Yes [provider]     Vital Signs: BP 108/65 (BP Location: Left Arm)   Pulse 61   Temp 98 F (36.7 C) (Oral)   Resp 20   Ht 6\' 1"  (1.854 m)   Wt 176 lb 9.4 oz (80.1 kg)   SpO2 94%   BMI 23.30 kg/m   Physical Exam patient awake, alert.  Right facial droop noted; moving all fours okay, fine motor movements okay, strength symmetrical and normal, right groin puncture site with some slight bruising laterally, mildly tender to palpation medially  Imaging: MR BRAIN WO CONTRAST Result Date: 05/22/2023 CLINICAL DATA:  Stroke, follow up. EXAM: MRI HEAD WITHOUT CONTRAST MRA HEAD WITHOUT CONTRAST TECHNIQUE: Multiplanar, multi-echo pulse sequences of the brain and surrounding structures were acquired without intravenous contrast. Angiographic images of the Circle of Willis were acquired using MRA technique without intravenous contrast. COMPARISON:  Head CT 05/21/2023.  CTA head/neck 05/21/2023. FINDINGS: MRI HEAD FINDINGS Brain: Scattered foci of acute infarction in the left MCA territory with the largest area along the left central sulcus. Single focus of acute infarction in the right caudate nucleus. No acute hemorrhage or significant mass effect. Background of mild chronic small-vessel disease with old perforator infarcts in the right basal ganglia and left thalamus. No hydrocephalus or extra-axial collection. Chronic microhemorrhage in the left superior frontal gyrus. Vascular: Normal flow voids. Skull and upper cervical spine: Normal marrow signal. Sinuses/Orbits: No acute or significant finding. Other: None. MRA HEAD FINDINGS Anterior circulation: Intracranial ICAs are patent without stenosis or aneurysm. The proximal ACAs and  MCAs are patent without stenosis or aneurysm. Distal branches are symmetric. Posterior circulation: Visualized portions of the distal vertebral arteries and basilar artery are patent without stenosis or aneurysm. The SCAs, AICAs and PICAs are patent proximally. The PCAs are patent  proximally without stenosis or aneurysm. Distal branches are symmetric. Anatomic variants: None. IMPRESSION: 1. Scattered foci of acute infarction in the left MCA territory with the largest area along the left central sulcus. Single focus of acute infarction in the right caudate nucleus. No acute hemorrhage or significant mass effect. 2. No large vessel occlusion or hemodynamically significant stenosis in the head. Electronically Signed   By: Audra Blend M.D.   On: 05/22/2023 18:27   MR ANGIO HEAD WO CONTRAST Result Date: 05/22/2023 CLINICAL DATA:  Stroke, follow up. EXAM: MRI HEAD WITHOUT CONTRAST MRA HEAD WITHOUT CONTRAST TECHNIQUE: Multiplanar, multi-echo pulse sequences of the brain and surrounding structures were acquired without intravenous contrast. Angiographic images of the Circle of Willis were acquired using MRA technique without intravenous contrast. COMPARISON:  Head CT 05/21/2023.  CTA head/neck 05/21/2023. FINDINGS: MRI HEAD FINDINGS Brain: Scattered foci of acute infarction in the left MCA territory with the largest area along the left central sulcus. Single focus of acute infarction in the right caudate nucleus. No acute hemorrhage or significant mass effect. Background of mild chronic small-vessel disease with old perforator infarcts in the right basal ganglia and left thalamus. No hydrocephalus or extra-axial collection. Chronic microhemorrhage in the left superior frontal gyrus. Vascular: Normal flow voids. Skull and upper cervical spine: Normal marrow signal. Sinuses/Orbits: No acute or significant finding. Other: None. MRA HEAD FINDINGS Anterior circulation: Intracranial ICAs are patent without stenosis or aneurysm. The proximal ACAs and MCAs are patent without stenosis or aneurysm. Distal branches are symmetric. Posterior circulation: Visualized portions of the distal vertebral arteries and basilar artery are patent without stenosis or aneurysm. The SCAs, AICAs and PICAs are patent  proximally. The PCAs are patent proximally without stenosis or aneurysm. Distal branches are symmetric. Anatomic variants: None. IMPRESSION: 1. Scattered foci of acute infarction in the left MCA territory with the largest area along the left central sulcus. Single focus of acute infarction in the right caudate nucleus. No acute hemorrhage or significant mass effect. 2. No large vessel occlusion or hemodynamically significant stenosis in the head. Electronically Signed   By: Audra Blend M.D.   On: 05/22/2023 18:27   ECHOCARDIOGRAM COMPLETE Result Date: 05/22/2023    ECHOCARDIOGRAM REPORT   Patient Name:   Dale Perez Date of Exam: 05/22/2023 Medical Rec #:  [id number]         Height:       73.0 in Accession #:    [id number]        Weight:       176.6 lb Date of Birth:  [birth year]          BSA:          2.041 m Patient Age:    67 years          BP:           131/71 mmHg Patient Gender: M                 HR:           68 bpm. Exam Location:  Inpatient Procedure: 2D Echo, Cardiac Doppler and Color Doppler (Both Spectral and Color            Flow Doppler were utilized during procedure). Indications:  Stroke  History:        Patient has no prior history of Echocardiogram examinations.                 Risk Factors:Dyslipidemia and Current Smoker. Abdominal aortic                 aneurysm.  Sonographer:    Raynelle Callow RDCS Referring Phys: [id number] CORTNEY E DE LA [place]OR[place] IMPRESSIONS  1. Left ventricular ejection fraction, by estimation, is 60 to 65%. The left ventricle has normal function. The left ventricle has no regional wall motion abnormalities. Left ventricular diastolic parameters were normal.  2. Right ventricular systolic function is normal. The right ventricular size is normal. Tricuspid regurgitation signal is inadequate for assessing PA pressure.  3. The mitral valve is normal in structure. Trivial mitral valve regurgitation.  4. The aortic valve is tricuspid. Aortic valve regurgitation is not visualized.   5. The inferior vena cava is dilated in size with <50% respiratory variability, suggesting right atrial pressure of 15 mmHg. Comparison(s): No prior Echocardiogram. FINDINGS  Left Ventricle: Left ventricular ejection fraction, by estimation, is 60 to 65%. The left ventricle has normal function. The left ventricle has no regional wall motion abnormalities. The left ventricular internal cavity size was normal in size. There is  no left ventricular hypertrophy. Left ventricular diastolic parameters were normal. Right Ventricle: The right ventricular size is normal. Right vetricular wall thickness was not assessed. Right ventricular systolic function is normal. Tricuspid regurgitation signal is inadequate for assessing PA pressure. Left Atrium: Left atrial size was normal in size. Right Atrium: Right atrial size was normal in size. Pericardium: There is no evidence of pericardial effusion. Mitral Valve: The mitral valve is normal in structure. Trivial mitral valve regurgitation. Tricuspid Valve: The tricuspid valve is normal in structure. Tricuspid valve regurgitation is trivial. Aortic Valve: The aortic valve is tricuspid. Aortic valve regurgitation is not visualized. Pulmonic Valve: The pulmonic valve was not well visualized. Pulmonic valve regurgitation is not visualized. No evidence of pulmonic stenosis. Aorta: The aortic root and ascending aorta are structurally normal, with no evidence of dilitation. Venous: The inferior vena cava is dilated in size with less than 50% respiratory variability, suggesting right atrial pressure of 15 mmHg. IAS/Shunts: No atrial level shunt detected by color flow Doppler.  LEFT VENTRICLE PLAX 2D LVIDd:         4.60 cm      Diastology LVIDs:         3.10 cm      LV e' medial:    8.27 cm/s LV PW:         0.90 cm      LV E/e' medial:  9.1 LV IVS:        1.00 cm      LV e' lateral:   15.20 cm/s LVOT diam:     2.50 cm      LV E/e' lateral: 4.9 LV SV:         116 LV SV Index:   57 LVOT  Area:     4.91 cm  LV Volumes (MOD) LV vol d, MOD A2C: 108.0 ml LV vol d, MOD A4C: 92.6 ml LV vol s, MOD A2C: 39.1 ml LV vol s, MOD A4C: 37.8 ml LV SV MOD A2C:     68.9 ml LV SV MOD A4C:     92.6 ml LV SV MOD BP:      62.7 ml RIGHT VENTRICLE  IVC RV S prime:     12.00 cm/s  IVC diam: 2.20 cm TAPSE (M-mode): 2.4 cm LEFT ATRIUM             Index        RIGHT ATRIUM           Index LA diam:        4.10 cm 2.01 cm/m   Perez Area:     11.60 cm LA Vol (A2C):   34.8 ml 17.05 ml/m  Perez Volume:   24.70 ml  12.10 ml/m LA Vol (A4C):   52.9 ml 25.92 ml/m LA Biplane Vol: 45.3 ml 22.20 ml/m  AORTIC VALVE LVOT Vmax:   116.00 cm/s LVOT Vmean:  75.100 cm/s LVOT VTI:    0.237 m  AORTA Ao Root diam: 3.90 cm Ao Asc diam:  3.30 cm MITRAL VALVE MV Area (PHT): 3.99 cm    SHUNTS MV Decel Time: 190 msec    Systemic VTI:  0.24 m MV E velocity: 75.20 cm/s  Systemic Diam: 2.50 cm MV A velocity: 77.10 cm/s MV E/A ratio:  0.98 Ola Berger MD Electronically signed by Ola Berger MD Signature Date/Time: 05/22/2023/3:05:54 PM    Final    CT HEAD WO CONTRAST ( ) Result Date: 05/22/2023 CLINICAL DATA:  Stroke, follow up. EXAM: CT HEAD WITHOUT CONTRAST TECHNIQUE: Contiguous axial images were obtained from the base of the skull through the vertex without intravenous contrast. RADIATION DOSE REDUCTION: This exam was performed according to the departmental dose-optimization program which includes automated exposure control, adjustment of the mA and/or kV according to patient size and/or use of iterative reconstruction technique. COMPARISON:  CT head from earlier today. FINDINGS: Brain: Possible subtle loss of gray differentiation in the left insula and overlying frontal lobe, which may represent early changes of left MCA territory infarct. 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 No evidence of acute hemorrhage, mass lesion, midline shift or hydrocephalus Vascular: No hyperdense  vessel identified. Calcific atherosclerosis. Skull: No acute fracture. Sinuses/Orbits: No acute finding. Other: Left mastoidectomy. IMPRESSION: 1. Possible subtle loss of gray differentiation in the left insula and overlying frontal lobe, which may represent early changes of left MCA territory infarct. Please see the forthcoming/ordered MRI head for more sensitive evaluation. 2. No acute hemorrhage. 3. Multiple remote infarcts, detailed above. Electronically Signed   By: Stevenson Elbe M.D.   On: 05/22/2023 03:16   CT ANGIO HEAD NECK W WO CM W PERF (CODE STROKE) Result Date: 05/21/2023 CLINICAL DATA:  Neuro deficit, acute stroke, speech difficulty and right-sided facial numbness/droop. EXAM: CT ANGIOGRAPHY HEAD AND NECK CT PERFUSION BRAIN TECHNIQUE: Multidetector CT imaging of the head and neck was performed using the standard protocol during bolus administration of intravenous contrast. Multiplanar CT image reconstructions and MIPs were obtained to evaluate the vascular anatomy. Carotid stenosis measurements (when applicable) are obtained utilizing NASCET criteria, using the distal internal carotid diameter as the denominator. Multiphase CT imaging of the brain was performed following IV bolus contrast injection. Subsequent parametric perfusion maps were calculated using RAPID software. RADIATION DOSE REDUCTION: This exam was performed according to the departmental dose-optimization program which includes automated exposure control, adjustment of the mA and/or kV according to patient size and/or use of iterative reconstruction technique. CONTRAST:  OMNIPAQUE  IOHEXOL  350 MG/ML SOLN COMPARISON:  Same-day head CT. FINDINGS: CTA NECK FINDINGS Aortic arch: Standard configuration of the aortic arch. Mild atherosclerosis. Imaged portion shows no evidence of aneurysm or  dissection. There is at least mild narrowing at the origin of the left common carotid artery. Otherwise no significant stenosis of the major  aortic arch vessels. Pulmonary arteries: As permitted by contrast timing, there are no filling defects in the visualized pulmonary arteries. Subclavian arteries: Patent bilaterally. Atherosclerosis involving the proximal aspect of both subclavian arteries. There is noncalcified atherosclerotic plaque in the proximal left subclavian artery with mild stenosis. Right carotid system: Patent from the origin to the skull base. Mild atherosclerosis of the common carotid artery. Additional atherosclerotic plaque with irregular noncalcified component within the distal common carotid artery. Calcified atherosclerosis at the carotid bifurcation without hemodynamically significant stenosis. Left carotid system: There is at least mild narrowing at the origin of the left common carotid artery. Additional atherosclerosis along the common carotid artery. Calcified atherosclerosis at the carotid bifurcation extending into the proximal cervical ICA with likely noncalcified component. Abrupt occlusion of the proximal left cervical ICA. There is reconstitution of the distal cervical segment which is slightly diminished in caliber with significantly diminished intraluminal contrast. Vertebral arteries: Code dominant. The vertebral arteries are patent from the origins to the vertebrobasilar confluence. Atherosclerosis involving the origins of both vertebral arteries resulting in moderate stenosis on the right and moderate to severe stenosis on the left. Skeleton: No acute findings. Degenerative changes in the cervical spine. Other neck: The visualized airway is patent. No cervical lymphadenopathy. Upper chest: Visualized lung apices are clear. Review of the MIP images confirms the above findings CTA HEAD FINDINGS ANTERIOR CIRCULATION: The left internal carotid artery is occluded at the skull base with reconstitution of the proximal/mid petrous segment. The left ICA is patent from the petrous segment to the ICA terminus. Atherosclerosis  of carotid siphons. There is mild-to-moderate stenosis of the left cavernous ICA. The right ICA is patent from the skull base to the ICA terminus without significant stenosis. MCAs: The middle cerebral arteries are patent bilaterally. ACAs: Patent bilaterally. Diminutive A1 segment of the left ACA which may be congenital versus related to atherosclerosis. POSTERIOR CIRCULATION: No significant stenosis, proximal occlusion, aneurysm, or vascular malformation. PCAs: The posterior cerebral arteries are patent bilaterally. Pcomm: Not well visualized. SCAs: The superior cerebellar arteries are patent bilaterally. Basilar artery: Patent AICAs: Patent PICAs: Patent Vertebral arteries: The intracranial vertebral arteries are patent. Venous sinuses: As permitted by contrast timing, patent. Anatomic variants: None Review of the MIP images confirms the above findings CT Brain Perfusion Findings: ASPECTS: 10 CBF (<30%) Volume: 0mL Perfusion (Tmax>6.0s) volume: Mismatch Volume: Infarction Location:There is no core infarct identified on CT perfusion. Diffuse elevated T-max throughout the left MCA territory suggestive of ischemic penumbra. IMPRESSION: Occlusion of the left ICA from the proximal cervical segment to the proximal/mid petrous segment which is likely acute and related to prominent atherosclerotic plaque at the carotid bifurcation. Intracranial arterial vasculature is otherwise patent. No core infarct identified on CT perfusion. Large region of elevated T-max in the left MCA territory concerning for ischemic penumbra. Multifocal atherosclerosis. Focal mild-to-moderate stenosis of the left cavernous ICA. Additional atherosclerosis at the right carotid bifurcation without hemodynamically significant stenosis. There is at least mild stenosis at the origin of the left common carotid artery. Atherosclerosis at the origins of the vertebral arteries with moderate to severe stenosis on the left and moderate stenosis  on the right. Aortic Atherosclerosis (ICD10-I70.0). The findings of left ICA occlusion and CT perfusion findings were called by telephone at the time of noncontrast CT interpretation on 05/21/2023 at 10:46 am to provider SALMAN  [facility] , who verbally acknowledged these results. Electronically Signed   By: Denny Flack M.D.   On: 05/21/2023 11:22   CT HEAD CODE STROKE WO CONTRAST Result Date: 05/21/2023 CLINICAL DATA:  Code stroke. Neuro deficit, aphasia, right-sided facial droop. EXAM: CT HEAD WITHOUT CONTRAST TECHNIQUE: Contiguous axial images were obtained from the base of the skull through the vertex without intravenous contrast. RADIATION DOSE REDUCTION: This exam was performed according to the departmental dose-optimization program which includes automated exposure control, adjustment of the mA and/or kV according to patient size and/or use of iterative reconstruction technique. COMPARISON:  CT head 04/09/2019. FINDINGS: Brain: No acute intracranial hemorrhage. No CT evidence of acute infarct. Nonspecific hypoattenuation in the periventricular and subcortical white matter favored to reflect chronic microvascular ischemic changes. Remote infarct involving the right caudate head and anterior limb of the internal capsule. Additional small remote lacunar infarct in the left thalamus. Remote cortical/subcortical infarcts in the posterior left frontal lobe and left parietal lobe. No edema, mass effect, or midline shift. The basilar cisterns are patent. Ventricles: The ventricles are normal. Vascular: Atherosclerotic calcifications of the carotid siphons. No hyperdense vessel. Skull: No acute or aggressive finding. Orbits: Orbits are symmetric. Sinuses: Mild mucosal thickening in the ethmoid sinuses. Other: Right mastoid air cells are clear. Sequelae of left mastoidectomy, likely canal wall down. ASPECTS [facility] Stroke Program Early CT Score) - Ganglionic level infarction (caudate, lentiform nuclei, internal  capsule, insula, M1-M3 cortex): 7 - Supraganglionic infarction (M4-M6 cortex): 3 Total score (0-10 with 10 being normal): 10 IMPRESSION: 1. No CT evidence of acute intracranial abnormality. 2. Multiple remote infarcts as above. Mild chronic microvascular ischemic changes. 3. ASPECTS is 10 These results were called by telephone at the time of interpretation on 05/21/2023 at 10:46 Am to provider Dr. Shane Darling, who verbally acknowledged these results. Electronically Signed   By: Denny Flack M.D.   On: 05/21/2023 11:03    Labs:  CBC: Recent Labs    05/21/23 1059 05/21/23 1102 05/22/23 0434  WBC 8.8  --  10.7*  HGB 16.5 17.0 13.6  HCT 51.1 50.0 41.9  PLT 242  --  232    COAGS: Recent Labs    05/21/23 1059  INR 1.1  APTT 28    BMP: Recent Labs    05/21/23 1059 05/21/23 1102 05/22/23 0434  NA 134* 139 140  K 4.2 4.6 4.6  CL 103 104 110  CO2 23  --  21*  GLUCOSE 105* 100* 132*  BUN 20 21 16   CALCIUM  8.2*  --  8.5*  CREATININE 0.91 1.00 0.91  GFRNONAA >60  --  >60    LIVER FUNCTION TESTS: Recent Labs    05/21/23 1059 05/22/23 0434  BILITOT 0.4 0.4  AST 18 18  ALT 21 20  ALKPHOS 89 75  PROT 5.8* 5.7*  ALBUMIN 3.2* 3.1*    Assessment and Plan: 68 y.o. male with PMH AAA and HLD who presented with acute onset of aphasia and mild right-sided numbness, Code stroke initiated, found to have occluded left internal carotid artery proximally, s/p revascularization of occluded left ICA proximally and branch of the superior division of the left MCA by Dr. Alvira Josephs  on 05/21/23. ; afebrile, no new labs today; patient to be scheduled for follow-up with Dr. Alvira Josephs  2 weeks after discharge; continue DAPT for minimum 6 months; no bending, stooping lifting more than 10 pounds for 2 weeks; other plans as per for neurology   Electronically Signed: D. Ernestina Headland  Ramey Ketcherside, PA-C 05/23/2023, 11:01 AM   I spent a total of 15 Minutes at the the patient's bedside AND on the patient's  hospital floor or unit, greater than 50% of which was counseling/coordinating care for cerebral arteriogram with endovascular intervention    Patient ID: Dale Perez, male   DOB: 1955-05-02, 68 y.o.   MRN: [id number]

## 2023-05-23 NOTE — Discharge Summary (Addendum)
 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   Patient Active Problem List   Diagnosis Date Noted   Essential hypertension 05/23/2023   Hyperlipemia 05/23/2023   Tobacco use disorder 05/23/2023   Acute ischemic left ICA stroke (HCC) 05/21/2023   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   Quinolones Other (See Comments)   Patient has an AAA (Abdominal Aortic Aneurysm)   Aspirin  Hives, Rash, Other (See Comments)   Reaction happened in childhood, but has been able to take 81 mg since then, however        Medication List     STOP taking these medications    Aleve 220 MG tablet Generic drug: naproxen sodium       TAKE these medications    aspirin  81 MG chewable tablet Chew 1 tablet (81 mg total) by mouth daily. Start taking on: May 24, 2023   atorvastatin  40 MG tablet Commonly known as: LIPITOR Take 1 tablet (40 mg total) by mouth daily. Start taking on: May 24, 2023 What changed:  medication strength how much to take   ticagrelor  90 MG Tabs tablet Commonly known as: BRILINTA  Take 1 tablet (90 mg total) by mouth 2 (two) times daily.         LABORATORY STUDIES CBC    Component Value Date/Time   WBC 10.7 (H) 05/22/2023 0434   RBC 4.75 05/22/2023 0434   HGB 13.6 05/22/2023 0434   HCT 41.9 05/22/2023 0434   PLT 232 05/22/2023 0434   MCV 88.2 05/22/2023 0434   MCH 28.6 05/22/2023 0434   MCHC 32.5 05/22/2023 0434   RDW 14.6 05/22/2023 0434   LYMPHSABS 1.3 05/22/2023 0434   MONOABS 0.5 05/22/2023 0434   EOSABS 0.0 05/22/2023 0434   BASOSABS 0.0 05/22/2023 0434   CMP    Component Value Date/Time   NA 140 05/22/2023 0434   K 4.6 05/22/2023 0434   CL 110 05/22/2023 0434   CO2 21 (L) 05/22/2023 0434   GLUCOSE 132 (H) 05/22/2023 0434   BUN 16 05/22/2023 0434   CREATININE 0.91 05/22/2023 0434   CALCIUM  8.5 (L) 05/22/2023 0434   PROT 5.7 (L) 05/22/2023 0434   ALBUMIN 3.1 (L) 05/22/2023 0434   AST 18 05/22/2023 0434   ALT 20 05/22/2023 0434   ALKPHOS 75 05/22/2023 0434   BILITOT 0.4 05/22/2023 0434   GFRNONAA >60 05/22/2023 0434   GFRAA >60 04/19/2019 0516   COAGS Lab Results  Component Value Date   INR 1.1 05/21/2023   INR 0.9 04/09/2019   Lipid Panel    Component Value Date/Time   CHOL 129 05/22/2023 0434  TRIG 82 05/22/2023 0434   HDL 25 (L) 05/22/2023 0434   CHOLHDL 5.2 05/22/2023 0434   VLDL 16 05/22/2023 0434   LDLCALC 88 05/22/2023 0434   HgbA1C  Lab Results  Component Value Date   HGBA1C 5.8 (H) 05/22/2023   Alcohol Level    Component Value Date/Time   ETH <10 05/21/2023 1059     SIGNIFICANT DIAGNOSTIC STUDIES MR BRAIN WO CONTRAST Result Date: 05/22/2023 CLINICAL DATA:  Stroke, follow up. EXAM: MRI HEAD WITHOUT CONTRAST MRA HEAD WITHOUT CONTRAST TECHNIQUE: Multiplanar, multi-echo pulse sequences of the brain and surrounding structures were acquired without intravenous contrast. Angiographic images of the Circle of Willis were acquired using MRA technique without intravenous contrast. COMPARISON:  Head CT 05/21/2023.  CTA head/neck 05/21/2023. FINDINGS: MRI HEAD FINDINGS Brain:  Scattered foci of acute infarction in the left MCA territory with the largest area along the left central sulcus. Single focus of acute infarction in the right caudate nucleus. No acute hemorrhage or significant mass effect. Background of mild chronic small-vessel disease with old perforator infarcts in the right basal ganglia and left thalamus. No hydrocephalus or extra-axial collection. Chronic microhemorrhage in the left superior frontal gyrus. Vascular: Normal flow voids. Skull and upper cervical spine: Normal marrow signal. Sinuses/Orbits: No acute or significant finding. Other: None. MRA HEAD FINDINGS Anterior circulation: Intracranial ICAs are patent without stenosis or aneurysm. The proximal ACAs and MCAs are patent without stenosis or aneurysm. Distal branches are symmetric. Posterior circulation: Visualized portions of the distal vertebral arteries and basilar artery are patent without stenosis or aneurysm. The SCAs, AICAs and PICAs are patent proximally. The PCAs are patent proximally without stenosis or aneurysm. Distal branches are symmetric. Anatomic variants: None. IMPRESSION: 1. Scattered foci of acute infarction in the left MCA territory with the largest area along the left central sulcus. Single focus of acute infarction in the right caudate nucleus. No acute hemorrhage or significant mass effect. 2. No large vessel occlusion or hemodynamically significant stenosis in the head. Electronically Signed   By: Audra Blend M.D.   On: 05/22/2023 18:27   MR ANGIO HEAD WO CONTRAST Result Date: 05/22/2023 CLINICAL DATA:  Stroke, follow up. EXAM: MRI HEAD WITHOUT CONTRAST MRA HEAD WITHOUT CONTRAST TECHNIQUE: Multiplanar, multi-echo pulse sequences of the brain and surrounding structures were acquired without intravenous contrast. Angiographic images of the Circle of Willis were acquired using MRA technique without intravenous contrast. COMPARISON:  Head CT 05/21/2023.  CTA head/neck 05/21/2023.  FINDINGS: MRI HEAD FINDINGS Brain: Scattered foci of acute infarction in the left MCA territory with the largest area along the left central sulcus. Single focus of acute infarction in the right caudate nucleus. No acute hemorrhage or significant mass effect. Background of mild chronic small-vessel disease with old perforator infarcts in the right basal ganglia and left thalamus. No hydrocephalus or extra-axial collection. Chronic microhemorrhage in the left superior frontal gyrus. Vascular: Normal flow voids. Skull and upper cervical spine: Normal marrow signal. Sinuses/Orbits: No acute or significant finding. Other: None. MRA HEAD FINDINGS Anterior circulation: Intracranial ICAs are patent without stenosis or aneurysm. The proximal ACAs and MCAs are patent without stenosis or aneurysm. Distal branches are symmetric. Posterior circulation: Visualized portions of the distal vertebral arteries and basilar artery are patent without stenosis or aneurysm. The SCAs, AICAs and PICAs are patent proximally. The PCAs are patent proximally without stenosis or aneurysm. Distal branches are symmetric. Anatomic variants: None. IMPRESSION: 1. Scattered foci of acute infarction in the left MCA territory with the largest  area along the left central sulcus. Single focus of acute infarction in the right caudate nucleus. No acute hemorrhage or significant mass effect. 2. No large vessel occlusion or hemodynamically significant stenosis in the head. Electronically Signed   By: Audra Blend M.D.   On: 05/22/2023 18:27   ECHOCARDIOGRAM COMPLETE Result Date: 05/22/2023    ECHOCARDIOGRAM REPORT   Patient Name:   Dale Perez Date of Exam: 05/22/2023 Medical Rec #:  [id number]         Height:       73.0 in Accession #:    [id number]        Weight:       176.6 lb Date of Birth:  [birth year]          BSA:          2.041 m Patient Age:    67 years          BP:           131/71 mmHg Patient Gender: M                 HR:           68 bpm.  Exam Location:  Inpatient Procedure: 2D Echo, Cardiac Doppler and Color Doppler (Both Spectral and Color            Flow Doppler were utilized during procedure). Indications:    Stroke  History:        Patient has no prior history of Echocardiogram examinations.                 Risk Factors:Dyslipidemia and Current Smoker. Abdominal aortic                 aneurysm.  Sonographer:    Raynelle Callow RDCS Referring Phys: [id number] CORTNEY E DE [place] IMPRESSIONS  1. Left ventricular ejection fraction, by estimation, is 60 to 65%. The left ventricle has normal function. The left ventricle has no regional wall motion abnormalities. Left ventricular diastolic parameters were normal.  2. Right ventricular systolic function is normal. The right ventricular size is normal. Tricuspid regurgitation signal is inadequate for assessing PA pressure.  3. The mitral valve is normal in structure. Trivial mitral valve regurgitation.  4. The aortic valve is tricuspid. Aortic valve regurgitation is not visualized.  5. The inferior vena cava is dilated in size with <50% respiratory variability, suggesting right atrial pressure of 15 mmHg. Comparison(s): No prior Echocardiogram. FINDINGS  Left Ventricle: Left ventricular ejection fraction, by estimation, is 60 to 65%. The left ventricle has normal function. The left ventricle has no regional wall motion abnormalities. The left ventricular internal cavity size was normal in size. There is  no left ventricular hypertrophy. Left ventricular diastolic parameters were normal. Right Ventricle: The right ventricular size is normal. Right vetricular wall thickness was not assessed. Right ventricular systolic function is normal. Tricuspid regurgitation signal is inadequate for assessing PA pressure. Left Atrium: Left atrial size was normal in size. Right Atrium: Right atrial size was normal in size. Pericardium: There is no evidence of pericardial effusion. Mitral Valve: The mitral valve is normal in  structure. Trivial mitral valve regurgitation. Tricuspid Valve: The tricuspid valve is normal in structure. Tricuspid valve regurgitation is trivial. Aortic Valve: The aortic valve is tricuspid. Aortic valve regurgitation is not visualized. Pulmonic Valve: The pulmonic valve was not well visualized. Pulmonic valve regurgitation is not visualized. No evidence of pulmonic stenosis. Aorta: The aortic root and ascending  aorta are structurally normal, with no evidence of dilitation. Venous: The inferior vena cava is dilated in size with less than 50% respiratory variability, suggesting right atrial pressure of 15 mmHg. IAS/Shunts: No atrial level shunt detected by color flow Doppler.  LEFT VENTRICLE PLAX 2D LVIDd:         4.60 cm      Diastology LVIDs:         3.10 cm      LV e' medial:    8.27 cm/s LV PW:         0.90 cm      LV E/e' medial:  9.1 LV IVS:        1.00 cm      LV e' lateral:   15.20 cm/s LVOT diam:     2.50 cm      LV E/e' lateral: 4.9 LV SV:         116 LV SV Index:   57 LVOT Area:     4.91 cm  LV Volumes (MOD) LV vol d, MOD A2C: 108.0 ml LV vol d, MOD A4C: 92.6 ml LV vol s, MOD A2C: 39.1 ml LV vol s, MOD A4C: 37.8 ml LV SV MOD A2C:     68.9 ml LV SV MOD A4C:     92.6 ml LV SV MOD BP:      62.7 ml RIGHT VENTRICLE             IVC RV S prime:     12.00 cm/s  IVC diam: 2.20 cm TAPSE (M-mode): 2.4 cm LEFT ATRIUM             Index        RIGHT ATRIUM           Index LA diam:        4.10 cm 2.01 cm/m   RA Area:     11.60 cm LA Vol (A2C):   34.8 ml 17.05 ml/m  RA Volume:   24.70 ml  12.10 ml/m LA Vol (A4C):   52.9 ml 25.92 ml/m LA Biplane Vol: 45.3 ml 22.20 ml/m  AORTIC VALVE LVOT Vmax:   116.00 cm/s LVOT Vmean:  75.100 cm/s LVOT VTI:    0.237 m  AORTA Ao Root diam: 3.90 cm Ao Asc diam:  3.30 cm MITRAL VALVE MV Area (PHT): 3.99 cm    SHUNTS MV Decel Time: 190 msec    Systemic VTI:  0.24 m MV E velocity: 75.20 cm/s  Systemic Diam: 2.50 cm MV A velocity: 77.10 cm/s MV E/A ratio:  0.98 Ola Berger MD  Electronically signed by Ola Berger MD Signature Date/Time: 05/22/2023/3:05:54 PM    Final    CT HEAD WO CONTRAST ( ) Result Date: 05/22/2023 CLINICAL DATA:  Stroke, follow up. EXAM: CT HEAD WITHOUT CONTRAST TECHNIQUE: Contiguous axial images were obtained from the base of the skull through the vertex without intravenous contrast. RADIATION DOSE REDUCTION: This exam was performed according to the departmental dose-optimization program which includes automated exposure control, adjustment of the mA and/or kV according to patient size and/or use of iterative reconstruction technique. COMPARISON:  CT head from earlier today. FINDINGS: Brain: Possible subtle loss of gray differentiation in the left insula and overlying frontal lobe, which may represent early changes of left MCA territory infarct. 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 No evidence of acute hemorrhage, mass lesion, midline shift or hydrocephalus Vascular: No hyperdense vessel identified. Calcific atherosclerosis. Skull: No  acute fracture. Sinuses/Orbits: No acute finding. Other: Left mastoidectomy. IMPRESSION: 1. Possible subtle loss of gray differentiation in the left insula and overlying frontal lobe, which may represent early changes of left MCA territory infarct. Please see the forthcoming/ordered MRI head for more sensitive evaluation. 2. No acute hemorrhage. 3. Multiple remote infarcts, detailed above. Electronically Signed   By: Stevenson Elbe M.D.   On: 05/22/2023 03:16   CT ANGIO HEAD NECK W WO CM W PERF (CODE STROKE) Result Date: 05/21/2023 CLINICAL DATA:  Neuro deficit, acute stroke, speech difficulty and right-sided facial numbness/droop. EXAM: CT ANGIOGRAPHY HEAD AND NECK CT PERFUSION BRAIN TECHNIQUE: Multidetector CT imaging of the head and neck was performed using the standard protocol during bolus administration of intravenous contrast. Multiplanar CT image  reconstructions and MIPs were obtained to evaluate the vascular anatomy. Carotid stenosis measurements (when applicable) are obtained utilizing NASCET criteria, using the distal internal carotid diameter as the denominator. Multiphase CT imaging of the brain was performed following IV bolus contrast injection. Subsequent parametric perfusion maps were calculated using RAPID software. RADIATION DOSE REDUCTION: This exam was performed according to the departmental dose-optimization program which includes automated exposure control, adjustment of the mA and/or kV according to patient size and/or use of iterative reconstruction technique. CONTRAST:  OMNIPAQUE  IOHEXOL  350 MG/ML SOLN COMPARISON:  Same-day head CT. FINDINGS: CTA NECK FINDINGS Aortic arch: Standard configuration of the aortic arch. Mild atherosclerosis. Imaged portion shows no evidence of aneurysm or dissection. There is at least mild narrowing at the origin of the left common carotid artery. Otherwise no significant stenosis of the major aortic arch vessels. Pulmonary arteries: As permitted by contrast timing, there are no filling defects in the visualized pulmonary arteries. Subclavian arteries: Patent bilaterally. Atherosclerosis involving the proximal aspect of both subclavian arteries. There is noncalcified atherosclerotic plaque in the proximal left subclavian artery with mild stenosis. Right carotid system: Patent from the origin to the skull base. Mild atherosclerosis of the common carotid artery. Additional atherosclerotic plaque with irregular noncalcified component within the distal common carotid artery. Calcified atherosclerosis at the carotid bifurcation without hemodynamically significant stenosis. Left carotid system: There is at least mild narrowing at the origin of the left common carotid artery. Additional atherosclerosis along the common carotid artery. Calcified atherosclerosis at the carotid bifurcation extending into the  proximal cervical ICA with likely noncalcified component. Abrupt occlusion of the proximal left cervical ICA. There is reconstitution of the distal cervical segment which is slightly diminished in caliber with significantly diminished intraluminal contrast. Vertebral arteries: Code dominant. The vertebral arteries are patent from the origins to the vertebrobasilar confluence. Atherosclerosis involving the origins of both vertebral arteries resulting in moderate stenosis on the right and moderate to severe stenosis on the left. Skeleton: No acute findings. Degenerative changes in the cervical spine. Other neck: The visualized airway is patent. No cervical lymphadenopathy. Upper chest: Visualized lung apices are clear. Review of the MIP images confirms the above findings CTA HEAD FINDINGS ANTERIOR CIRCULATION: The left internal carotid artery is occluded at the skull base with reconstitution of the proximal/mid petrous segment. The left ICA is patent from the petrous segment to the ICA terminus. Atherosclerosis of carotid siphons. There is mild-to-moderate stenosis of the left cavernous ICA. The right ICA is patent from the skull base to the ICA terminus without significant stenosis. MCAs: The middle cerebral arteries are patent bilaterally. ACAs: Patent bilaterally. Diminutive A1 segment of the left ACA which may be congenital versus related to atherosclerosis. POSTERIOR CIRCULATION:  No significant stenosis, proximal occlusion, aneurysm, or vascular malformation. PCAs: The posterior cerebral arteries are patent bilaterally. Pcomm: Not well visualized. SCAs: The superior cerebellar arteries are patent bilaterally. Basilar artery: Patent AICAs: Patent PICAs: Patent Vertebral arteries: The intracranial vertebral arteries are patent. Venous sinuses: As permitted by contrast timing, patent. Anatomic variants: None Review of the MIP images confirms the above findings CT Brain Perfusion Findings: ASPECTS: 10 CBF (<30%)  Volume: 0mL Perfusion (Tmax>6.0s) volume: Mismatch Volume: Infarction Location:There is no core infarct identified on CT perfusion. Diffuse elevated T-max throughout the left MCA territory suggestive of ischemic penumbra. IMPRESSION: Occlusion of the left ICA from the proximal cervical segment to the proximal/mid petrous segment which is likely acute and related to prominent atherosclerotic plaque at the carotid bifurcation. Intracranial arterial vasculature is otherwise patent. No core infarct identified on CT perfusion. Large region of elevated T-max in the left MCA territory concerning for ischemic penumbra. Multifocal atherosclerosis. Focal mild-to-moderate stenosis of the left cavernous ICA. Additional atherosclerosis at the right carotid bifurcation without hemodynamically significant stenosis. There is at least mild stenosis at the origin of the left common carotid artery. Atherosclerosis at the origins of the vertebral arteries with moderate to severe stenosis on the left and moderate stenosis on the right. Aortic Atherosclerosis (ICD10-I70.0). The findings of left ICA occlusion and CT perfusion findings were called by telephone at the time of noncontrast CT interpretation on 05/21/2023 at 10:46 am to provider [facility] , who verbally acknowledged these results. Electronically Signed   By: Denny Flack M.D.   On: 05/21/2023 11:22   CT HEAD CODE STROKE WO CONTRAST Result Date: 05/21/2023 CLINICAL DATA:  Code stroke. Neuro deficit, aphasia, right-sided facial droop. EXAM: CT HEAD WITHOUT CONTRAST TECHNIQUE: Contiguous axial images were obtained from the base of the skull through the vertex without intravenous contrast. RADIATION DOSE REDUCTION: This exam was performed according to the departmental dose-optimization program which includes automated exposure control, adjustment of the mA and/or kV according to patient size and/or use of iterative reconstruction technique. COMPARISON:  CT  head 04/09/2019. FINDINGS: Brain: No acute intracranial hemorrhage. No CT evidence of acute infarct. Nonspecific hypoattenuation in the periventricular and subcortical white matter favored to reflect chronic microvascular ischemic changes. Remote infarct involving the right caudate head and anterior limb of the internal capsule. Additional small remote lacunar infarct in the left thalamus. Remote cortical/subcortical infarcts in the posterior left frontal lobe and left parietal lobe. No edema, mass effect, or midline shift. The basilar cisterns are patent. Ventricles: The ventricles are normal. Vascular: Atherosclerotic calcifications of the carotid siphons. No hyperdense vessel. Skull: No acute or aggressive finding. Orbits: Orbits are symmetric. Sinuses: Mild mucosal thickening in the ethmoid sinuses. Other: Right mastoid air cells are clear. Sequelae of left mastoidectomy, likely canal wall down. ASPECTS [place] Stroke Program Early CT Score) - Ganglionic level infarction (caudate, lentiform nuclei, internal capsule, insula, M1-M3 cortex): 7 - Supraganglionic infarction (M4-M6 cortex): 3 Total score (0-10 with 10 being normal): 10 IMPRESSION: 1. No CT evidence of acute intracranial abnormality. 2. Multiple remote infarcts as above. Mild chronic microvascular ischemic changes. 3. ASPECTS is 10 These results were called by telephone at the time of interpretation on 05/21/2023 at 10:46 Am to provider Dr. Shane Darling, who verbally acknowledged these results. Electronically Signed   By: Denny Flack M.D.   On: 05/21/2023 11:03       HISTORY OF PRESENT ILLNESS 68 y.o. patient with history of smoking, hyperlipidemia and abdominal aortic  aneurysm aphasia, disorientation, right-sided facial droop and right-sided numbness. 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  Small vessel disease. ASPECTS 10.     CTA head & neck Occlusion of the left ICA from the proximal cervical segment to the proximal/mid petrous segment which is likely acute and related to prominent atherosclerotic plaque at the carotid bifurcation. CT perfusion no core infarct and large, 107 mL penumbra.  Post IR CT no hemorrhage MRI Scattered foci of acute infarction in the left MCA territory with the largest area along the left central sulcus. Single focus of acute infarction in the right caudate nucleus MRA  No LVO  2D Echo EF 60 to 65%. UDS +benzos LDL 88 HgbA1c 5.8 No antithrombotic prior to admission, now on aspirin  81 mg daily and Brilinta  (ticagrelor ) 90 mg bid per IR   Hypertension Home meds:  none  Stable Blood Pressure Goal: SBP 120-160 for first 24 hours then less than 180    Hyperlipidemia Home meds:  atorvastatin  10mg ,  resumed in hospital LDL 88, goal < 70 Increase atorvastatin  to 40mg   Continue statin at discharge   Tobacco Abuse Patient smokes cigarettes 40 pack years        Ready to quit? No Nicotine  replacement therapy provided   Substance Abuse UDS positive for benzos       Ready to quit? No TOC consult for cessation placed     Other Stroke Risk Factors ETOH use, alcohol level <10, advised to drink no more than 2 drink(s) a day   Patient was deemed safe and appropriate for discharge.  Family was at the bedside, questions were answered and voiced understanding.  DISCHARGE EXAM  PHYSICAL EXAM General:  Alert, well-nourished, well-developed patient in no acute distress Psych:  Mood and affect appropriate for situation CV: Regular rate and rhythm on monitor Respiratory:  Regular, unlabored respirations on room air GI: Abdomen soft and nontender  NEURO:  Mental Status:  AA&Ox3  Speech/Language:speech is without aphasia. Mild dysarthria naming, repetition, fluency, and comprehension intact.   Cranial Nerves:  II: PERRL. Visual fields full.  III, IV, VI: EOMI. Eyelids elevate symmetrically.  V: Sensation is intact to light touch and symmetrical to face.  VII: Right facial droop  VIII: hearing intact to voice. IX, X: Palate elevates symmetrically. Phonation is normal.  XB:[id number] shrug 5/5. XII: tongue is midline without fasciculations. Motor: 5/5 strength to all muscle groups tested.  Tone: is normal and bulk is normal Sensation-subjective numbness on right hand Coordination: FTN intact bilaterally, HKS: no ataxia in BLE.No drift.  Gait- deferred  NIHSS 3    Discharge Diet       Diet   Diet Heart Room service appropriate? Yes with Assist; Fluid consistency: Thin   liquids  DISCHARGE PLAN Disposition: Home aspirin  81 mg daily and Brilinta  (ticagrelor ) 90 mg bid for secondary stroke prevention for 6 months  Ongoing stroke risk factor control by Primary Care Physician at time of discharge Follow-up PCP Minus Amel, MD in 2 weeks. Follow up with Dr. Alvira Josephs in 2 weeks after discharge Follow-up in [facility] in 8 weeks, office to schedule an appointment. Able to see NP in  clinic.  50 minutes were spent preparing discharge.  Jonette Nestle DNP, ACNPC-AG  [facility] I have personally obtained history,examined this patient, reviewed notes, independently viewed imaging studies, participated in medical decision making and plan of care.ROS completed by me personally and pertinent positives fully documented  I have made any additions or clarifications directly to the above note. Agree with note above.    Ardella Beaver, MD Medical Director [facility] Pager: [phone] 05/23/2023 3:39 PM

## 2023-05-26 ENCOUNTER — Telehealth (HOSPITAL_COMMUNITY): Payer: Self-pay | Admitting: Radiology

## 2023-05-26 NOTE — Telephone Encounter (Signed)
 68 y.o. male outpatient. History of sudden onset of aphasia with right sided facial droop and right sided weakness. Status post endovascular revascularization of occluded right internal carotid artery proximally with proximal flow arrest using stent assisted angioplasty. Status post complete revascularization of a side branch of the superior division of the left middle cerebral artery with 1 pass with a 4 mm x 40 mm Solitaire X retrieval device, and contact aspiration achieving a TICI 3 revascularization.  Per wife Patient was running a low grade fever overnight with "complaints of a sore throat a little this morning". Mrs. Melucci states that the patient has no other URI signs or symptoms. Neurologically wife states the Patient is improving. His speech and right sided weakness are both able to speak.  Facial droop persistent. Right groin access site has no erythema and the site is not warm to the touch.  Per wife the Patient endorses mild tenderness with palpation and  mild bruising is present.   As Patient appears to have URI complaints recommend that they follow up with PCP. This was communicated to Mrs. Swann who verbalized understanding and is in agreement with the plan of care.   Patient will follow up in 8 weeks as scheduled.

## 2023-05-27 ENCOUNTER — Other Ambulatory Visit: Payer: Self-pay

## 2023-05-27 ENCOUNTER — Ambulatory Visit: Payer: PRIVATE HEALTH INSURANCE | Attending: Nurse Practitioner | Admitting: Speech Pathology

## 2023-05-27 ENCOUNTER — Encounter: Payer: Self-pay | Admitting: Speech Pathology

## 2023-05-27 DIAGNOSIS — I639 Cerebral infarction, unspecified: Secondary | ICD-10-CM | POA: Diagnosis not present

## 2023-05-27 DIAGNOSIS — R471 Dysarthria and anarthria: Secondary | ICD-10-CM | POA: Diagnosis present

## 2023-05-27 NOTE — Patient Instructions (Signed)
    Oral exercises/Swallow exercises   Do exercises 20x each, 3x a day - in the mirror, slow and BIG  1. Alternate pucker and smile - OOO-EEE  2. Open mouth big Ahh-OOO with mouth open big  3. Pucker and move your lips side to side  4. Puff up your cheeks with air BIG - swish air from side to side  5. Press lips together flat and pop them open  6. Pucker and kiss big  7. Hold tongue depressor or in your lips (no teeth) out straight - hold for  15 minutes   Read each sentence 3x each slow and BIG - open your mouth  Repeat each word 3x, then put it in a sentence  Speak With Effort  Speak With Purpose  Take Control of your Speech  Say it Like You Mean It!!

## 2023-05-27 NOTE — Therapy (Signed)
 OUTPATIENT SPEECH LANGUAGE PATHOLOGY EVALUATION   Patient Name: Dale Perez MRN: [id number] DOB:May 22, 1955, 68 y.o., male Today's Date: 05/27/2023  PCP: Dale Amel, Perez REFERRING PROVIDER: Laymond Priestly, NP - eval routed to Dr. Janett Perez  END OF SESSION:  End of Session - 05/27/23 1502     Visit Number 1    Number of Visits 1    SLP Start Time 1400    SLP Stop Time  1445    SLP Time Calculation (min) 45 min    Activity Tolerance Patient tolerated treatment well             Past Medical History:  Diagnosis Date   AAA (abdominal aortic aneurysm) (HCC) 04/2019   4.4cm   Essential hypertension 05/23/2023   Past Surgical History:  Procedure Laterality Date   APPENDECTOMY     COLONOSCOPY  11/2008   Rourk; sigmoid diverticula, otherwise normal exam. Repeat in 2020.    COLONOSCOPY N/A 09/09/2019   Procedure: COLONOSCOPY;  Surgeon: Dale Espy, Perez;  [facility];  Service: Endoscopy;  Laterality: N/A;  9:45am   I & D EXTREMITY Left 08/10/2015   Procedure: IRRIGATION AND DEBRIDEMENT EXTREMITY;  Surgeon: Dale Chimera, Perez;  Location: [facility] OR;  Service: Orthopedics;  Laterality: Left;   IR CT HEAD LTD  05/21/2023   IR CT HEAD LTD  05/21/2023   IR INTRAVSC STENT CERV CAROTID W/O EMB-PROT MOD SED INC ANGIO  05/21/2023   IR PERCUTANEOUS ART THROMBECTOMY/INFUSION INTRACRANIAL INC DIAG ANGIO  05/21/2023   KNEE CARTILAGE SURGERY Right 02/2019   KNEE SURGERY     RADIOLOGY WITH ANESTHESIA N/A 05/21/2023   Procedure: RADIOLOGY WITH ANESTHESIA;  Surgeon: Dale Perez, Dale Perez;  Location: [facility] OR;  Service: Radiology;  Laterality: N/A;   SHOULDER ARTHROSCOPY Left 08/2018   SHOULDER SURGERY     TENDON REPAIR N/A 08/10/2015   Procedure: TENDON REPAIR WITH WOUND CLOSURE;  Surgeon: Dale Chimera, Perez;  Location: [facility] OR;  Service: Orthopedics;  Laterality: N/A;   TONSILLECTOMY     Patient Active Problem List   Diagnosis Date Noted   Essential hypertension 05/23/2023    Hyperlipemia 05/23/2023   Tobacco use disorder 05/23/2023   Acute ischemic left ICA stroke (HCC) 05/21/2023   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 male who presented 05/21/23 with aphasia, AMS, R-sided facial droop, and R-sided numbness. Outside the window for TNK. CTA showed L ICA occlusion. S/p mechanical thrombectomy 4/17. CT head showed possible subtle loss of gray differentiation in the left insula and overlying frontal lobe, which may represent early changes of left MCA territory infarct. MRI pending. PMH: smoking, HLD, abdominal aortic aneurysm   PAIN:  Are you having pain? Yes: NPRS scale: 7/10 Pain location: right leg left neck Pain description: aching, sore Aggravating factors: swallow, movement Relieving factors: rest, tylenol   FALLS: Has patient fallen in last 6 months?  Number of falls: 1 during  stroke  LIVING ENVIRONMENT: Lives with: lives with their spouse Lives in: House/apartment  PLOF:  Level of assistance: Independent with ADLs, Independent with IADLs Employment: Retired  PATIENT GOALS: Pt elects to defer speech therapy  OBJECTIVE:  Note: Objective measures were completed at Evaluation unless otherwise noted.  COGNITION: Overall cognitive status: Within functional limits for tasks assessed Areas of impairment:  Functional deficits: N/A  AUDITORY  COMPREHENSION: Overall auditory comprehension: Appears intact   EXPRESSION: verbal  VERBAL EXPRESSION: Level of generative/spontaneous verbalization: conversation Automatic speech: name: intact and social response: intact  Repetition: Appears intact Naming: Confrontation: 76-100% Pragmatics: Appears intact Comments:  No aphasia present    MOTOR SPEECH: Overall motor speech: impaired Level of impairment: Phrase Respiration: thoracic breathing Phonation: normal Resonance: WFL Articulation: Impaired: phrase Intelligibility: Intelligibility reduced Motor planning: Appears intact Motor speech errors: aware Interfering components: premorbid status Effective technique: slow rate, increased vocal intensity, and over articulate Family reports Dale Perez had low volume and "mumbled" prior to CVA  ORAL MOTOR EXAMINATION: Overall status: Impaired:   Labial: Right (ROM, Sensation, and Coordination) Lingual: Right (Coordination) Comments:                                                                                                                              TREATMENT DATE:   05/27/23: Dale Perez completed - Dale Perez elects to defer ST, family wishes him to participate in ST. I provided oral motor exercises for labial weakness - [name] demonstrated these with frequent modeling and verbal cues to make movements "big and slow" and "open your mouth big" Daughter endorses they will cue him at home. Trained in speech exercises - provided rhyming sentences and 3,4 and 5 syllable words to practice slow rate and over articulation - [name] required frequent mod verbal cues and modeling for volume, over articulation and slow rate. He endorses drooling - instruction provided to be mindful to swallow more frequently - he is avoiding this due to pain s/p ICA angiogram   PATIENT EDUCATION: Education details: See Treatment, See Patient Instructions, compensations for dysarthria; HEP for dysarthria Person  educated: Patient and Child(ren) Education method: Explanation, Demonstration, Verbal cues, and Handouts Education comprehension: verbalized understanding, returned demonstration, verbal cues required, and needs further education    ASSESSMENT:  CLINICAL IMPRESSION: Patient is a 68 y.o. male who was seen today for mild flaccid dysarthria. He and his daughter, Dale Perez deny cognitive changes and any word finding difficulty. Edrian demonstrates good memory and awareness of hospitalization and Perez recommendations. They report significant improvement in speech since d/c from hospital. Dale Perez endorses difficulty understanding her dad and frequent need for him to repeat himself. Keylon is able to achieve clear intelligible speech with effort of over articulation with min A. Yecheskel elects to defer skilled ST at this time. I provided and trained HEP for dysarthria and labial weakness - see Treatment.   OBJECTIVE IMPAIRMENTS: include dysarthria. These impairments are limiting patient from effectively communicating at home and  in community. Factors affecting potential to achieve goals and functional outcome are cooperation/participation level. Patient will benefit from skilled SLP services to address above impairments and improve overall function.  REHAB POTENTIAL: Good  PLAN:  No skilled ST at this time    Lela Purple, CCC-SLP 05/27/2023, 3:04 PM

## 2023-05-28 ENCOUNTER — Telehealth: Payer: Self-pay

## 2023-05-28 DIAGNOSIS — I63232 Cerebral infarction due to unspecified occlusion or stenosis of left carotid arteries: Secondary | ICD-10-CM

## 2023-05-28 NOTE — Transitions of Care (Post Inpatient/ED Visit) (Deleted)
   05/28/2023  Name: Dale Perez MRN: [id number] DOB: [birth year]  {[id number]}

## 2023-05-28 NOTE — Patient Outreach (Signed)
 Stroke Discharge Follow-up   05/28/2023 Name:  Dale Perez MRN:  [id number] DOB:  Jun 04, 1955  Subjective: Dale Perez is a 68 y.o. year old male who is a primary care patient of Dale Amel, MD An Emmi alert was received indicating patient responded to questions: Feeling worse overall?. I reached out by phone to follow up on the alert and spoke to Caregiver Patient. Patient reports that he has a sore throat. Reports numbness in his right thumb and index finger.  Bruising and pain in both legs. Reports incision site is ok. Denies any bleeding.  Patient reports that he wishes that I speak with his wife.  Wife states patient had a fever on  05/24/2023 and 05/25/2023 of 100.6. Wife reports that she called the on call doctor. Wife reports doctor was not concerned.  Wife states that patient had complete numbness in the right hand and reports decrease numbness at this time. Wife states that current numbness is not new.  Wife also reports no bleeding at incision site and denies any signs of infection.  Reports that patient is acting normal. Wife denies that patient has any current fever.   Care Management Interventions: Provided support and listening ear. Reviewed symptoms of recurring stroke. Reviewed s/s and stroke.  Reviewed all medications, Completed SDOH.  Confirmed patient has all of his follow up appointment. Encouraged patient to gargle with warm salt water  and take tylenol  for throat discomfort. Encouraged patient to continue to exercise his fingers and hand.  This note sent to PCP, neurology and IR.   Provided my contact information for wife to call me back if needed.  Reviewed when to call 911 and when to get immediate medical attention.   Follow up plan: No further intervention required.   Orpha Blade, RN, BSN, CEN [employer]- Transition of Care Team.  [facility] [phone]

## 2023-06-01 NOTE — Progress Notes (Unsigned)
 Patient name: Dale Perez MRN: [id number] DOB: [birth year] Sex: male  REASON FOR CONSULT: Follow-up after CTA for abdominal aortic aneurysm  HPI: Dale Perez is a 69 y.o. male, with history of tobacco abuse who presents for follow-up after CTA for abdominal aortic aneurysm.  Patient fell 17 feet out of a tree and underwent trauma work-up.  Ultimately he sustained number of rib fractures and was found to have a 4.4 cm abdominal aortic aneurysm on a CT from 04/11/2019.    Last ultrasound of his AAA showed this did increase to 5.4 cm and I sent him to CT scan.  Since last evaluation was admitted with left ICA occlusion and underwent thrombectomy with IR including a left ICA stent and mechanical thrombectomy of the left M2.  Has some facial droop on the right today with some weakness in his right hand particularly in the first 2 fingers.  Past Medical History:  Diagnosis Date   AAA (abdominal aortic aneurysm) (HCC) 04/2019   4.4cm   Essential hypertension 05/23/2023    Past Surgical History:  Procedure Laterality Date   APPENDECTOMY     COLONOSCOPY  11/2008   Rourk; sigmoid diverticula, otherwise normal exam. Repeat in 2020.    COLONOSCOPY N/A 09/09/2019   Procedure: COLONOSCOPY;  Surgeon: Suzette Espy, MD;  [facility];  Service: Endoscopy;  Laterality: N/A;  9:45am   I & D EXTREMITY Left 08/10/2015   Procedure: IRRIGATION AND DEBRIDEMENT EXTREMITY;  Surgeon: Rober Chimera, MD;  Location: [facility] OR;  Service: Orthopedics;  Laterality: Left;   IR CT HEAD LTD  05/21/2023   IR CT HEAD LTD  05/21/2023   IR INTRAVSC STENT CERV CAROTID W/O EMB-PROT MOD SED INC ANGIO  05/21/2023   IR PERCUTANEOUS ART THROMBECTOMY/INFUSION INTRACRANIAL INC DIAG ANGIO  05/21/2023   KNEE CARTILAGE SURGERY Right 02/2019   KNEE SURGERY     RADIOLOGY WITH ANESTHESIA N/A 05/21/2023   Procedure: RADIOLOGY WITH ANESTHESIA;  Surgeon: Radiologist, Medication, MD;  Location: [facility] OR;  Service: Radiology;   Laterality: N/A;   SHOULDER ARTHROSCOPY Left 08/2018   SHOULDER SURGERY     TENDON REPAIR N/A 08/10/2015   Procedure: TENDON REPAIR WITH WOUND CLOSURE;  Surgeon: Rober Chimera, MD;  Location: [facility] OR;  Service: Orthopedics;  Laterality: N/A;   TONSILLECTOMY      Family History  Problem Relation Age of Onset   Heart attack Father    Colon cancer Neg Hx    Colon polyps Neg Hx     SOCIAL HISTORY: Social History   Socioeconomic History   Marital status: Married    Spouse name: Not on file   Number of children: Not on file   Years of education: Not on file   Highest education level: Not on file  Occupational History   Not on file  Tobacco Use   Smoking status: Every Day    Current packs/day: 1.00    Average packs/day: 1 pack/day for 40.0 years (40.0 ttl pk-yrs)    Types: Cigarettes   Smokeless tobacco: Never  Vaping Use   Vaping status: Never Used  Substance and Sexual Activity   Alcohol use: Not on file    Comment: rarely   Drug use: Never   Sexual activity: Yes  Other Topics Concern   Not on file  Social History Narrative   ** Merged History Encounter **       Social Drivers of Corporate investment banker Strain: Not on file  Food Insecurity: No Food Insecurity (05/28/2023)   Hunger Vital Sign    Worried About Running Out of Food in the Last Year: Never true    Ran Out of Food in the Last Year: Never true  Transportation Needs: No Transportation Needs (05/28/2023)   PRAPARE - Administrator, Civil Service (Medical): No    Lack of Transportation (Non-Medical): No  Physical Activity: Not on file  Stress: Not on file  Social Connections: Not on file  Intimate Partner Violence: Patient Unable To Answer (05/28/2023)   Humiliation, Afraid, Rape, and Kick questionnaire    Fear of Current or Ex-Partner: Patient unable to answer    Emotionally Abused: Patient unable to answer    Physically Abused: Patient unable to answer    Sexually Abused: Patient unable  to answer    Allergies  Allergen Reactions   Penicillins Other (See Comments)    Reaction unconfirmed- was stopped in the hospital after the initial reaction however (per family).   Quinolones Other (See Comments)    Patient has an AAA (Abdominal Aortic Aneurysm)   Aspirin  Hives, Rash and Other (See Comments)    Reaction happened in childhood, but has been able to take 81 mg since then, however    Current Outpatient Medications  Medication Sig Dispense Refill   aspirin  81 MG chewable tablet Chew 1 tablet (81 mg total) by mouth daily. 30 tablet 0   atorvastatin  (LIPITOR) 40 MG tablet Take 1 tablet (40 mg total) by mouth daily. 30 tablet 1   ticagrelor  (BRILINTA ) 90 MG TABS tablet Take 1 tablet (90 mg total) by mouth 2 (two) times daily. 60 tablet 1   No current facility-administered medications for this visit.   Facility-Administered Medications Ordered in Other Visits  Medication Dose Route Frequency Provider Last Rate Last Admin   acetaminophen  (TYLENOL ) tablet 650 mg  650 mg Oral Q6H Dorena Gander, MD   650 mg at 04/19/19 1207   HYDROmorphone  (DILAUDID ) injection 1 mg  1 mg Intravenous Q2H PRN Dorena Gander, MD   1 mg at 04/15/19 2255   metoprolol  tartrate (LOPRESSOR ) injection 5 mg  5 mg Intravenous Q6H PRN Adalberto Acton, MD   5 mg at 04/12/19 0146    REVIEW OF SYSTEMS:  [X]  denotes positive finding, [ ]  denotes negative finding Cardiac  Comments:  Chest pain or chest pressure:    Shortness of breath upon exertion:    Short of breath when lying flat:    Irregular heart rhythm:        Vascular    Pain in calf, thigh, or hip brought on by ambulation:    Pain in feet at night that wakes you up from your sleep:     Blood clot in your veins:    Leg swelling:         Pulmonary    Oxygen at home:    Productive cough:     Wheezing:         Neurologic    Sudden weakness in arms or legs:     Sudden numbness in arms or legs:     Sudden onset of difficulty speaking or  slurred speech:    Temporary loss of vision in one eye:     Problems with dizziness:         Gastrointestinal    Blood in stool:     Vomited blood:         Genitourinary    Burning when urinating:  Blood in urine:        Psychiatric    Major depression:         Hematologic    Bleeding problems:    Problems with blood clotting too easily:        Skin    Rashes or ulcers:        Constitutional    Fever or chills:      PHYSICAL EXAM: There were no vitals filed for this visit.   GENERAL: The patient is a well-nourished male, in no acute distress. The vital signs are documented above. CARDIAC: There is a regular rate and rhythm.  VASCULAR:  Palpable femoral pulses bilateral groins. Palpable DP/PT pulses bilateral lower extremities. PULMONARY: No respiratory distress. ABDOMEN: Soft and non-tender.  No pain with palpation of aneurysm. MUSCULOSKELETAL: There are no major deformities or cyanosis. NEUROLOGIC: No focal weakness or paresthesias are detected. PSYCHIATRIC: The patient has a normal affect.  DATA:   CTA 05/19/23: Awaiting final read from radiology but on my review this measures 5 cm on coronal and sagittal imaging.  AAA duplex 04/21/23 shows his aneurysm has increased 5.04 cm ---> 5.41 cm over the past 6 months  Assessment/Plan:  68 year old male presents for follow-up after CTA for evaluation of AAA.  Ultimately I discussed I reviewed his CTA and although we are awaiting a final read from radiology I only measure the maximal diameter of his abdominal aneurysm at 5 cm.  Again discussed in men we repair these at greater than 5.5 cm.  I think we continue surveillance for now.  I have recommended another AAA duplex here in the office in 6 months.  Ultimately he just was hospitalized with a stroke with left ICA occlusion involving M2 thrombectomy and left ICA stenting with IR.  This will give him a chance to continue recovering from his stroke.   Young Hensen, MD [facility] Office: [phone]

## 2023-06-02 ENCOUNTER — Encounter: Payer: Self-pay | Admitting: Vascular Surgery

## 2023-06-02 ENCOUNTER — Ambulatory Visit: Attending: Vascular Surgery | Admitting: Vascular Surgery

## 2023-06-02 VITALS — BP 125/81 | HR 63 | Temp 98.2°F | Resp 18 | Ht 73.0 in | Wt 163.2 lb

## 2023-06-02 DIAGNOSIS — I7143 Infrarenal abdominal aortic aneurysm, without rupture: Secondary | ICD-10-CM | POA: Insufficient documentation

## 2023-06-03 ENCOUNTER — Encounter: Payer: Self-pay | Admitting: Speech Pathology

## 2023-06-04 ENCOUNTER — Other Ambulatory Visit (HOSPITAL_COMMUNITY): Payer: Self-pay | Admitting: Radiology

## 2023-06-04 MED ORDER — TICAGRELOR 90 MG PO TABS: 90.0000 mg | ORAL_TABLET | Freq: Two times a day (BID) | ORAL | 0 refills | Status: DC

## 2023-06-10 ENCOUNTER — Telehealth: Payer: Self-pay

## 2023-06-10 NOTE — Telephone Encounter (Signed)
 Patient's wife called IR PA line with concerns of low back and abd pain that has been present since 05/24/23. She is concerned that this pain may be related to aneurysm intervention with Dr. Alvira Josephs on 05/21/23.  He continues to experience R hand numbness that has been present since procedure; otherwise denies any neurologic concerns. N/V, fever, chills, blood in stool or urine. Reports groin puncture location to be improving. Less tender and bruised today, soft. Last BM 2 days ago, but usually only has BM 2x/wk at baseline. He is taking Brilinta  and ASA as prescribed. No new trauma. Did experience a fall in March which led to head imaging that revealed above discussed aneurysm.   Discussed pt case with Dr. Alvira Josephs. No indication that reported symptoms are related to NIR procedure. Encouraged patient to f/u with PCP for these sx. Discussed red flag sx which should prompt him to go to ED sooner.

## 2023-06-11 ENCOUNTER — Other Ambulatory Visit: Payer: Self-pay | Admitting: *Deleted

## 2023-06-11 DIAGNOSIS — I7143 Infrarenal abdominal aortic aneurysm, without rupture: Secondary | ICD-10-CM

## 2023-06-12 ENCOUNTER — Other Ambulatory Visit (HOSPITAL_COMMUNITY): Payer: Self-pay | Admitting: Family Medicine

## 2023-06-12 ENCOUNTER — Ambulatory Visit (HOSPITAL_COMMUNITY)
Admission: RE | Admit: 2023-06-12 | Discharge: 2023-06-12 | Disposition: A | Discharge status: Home / Self Care | Source: Ambulatory Visit | Attending: Family Medicine | Admitting: Family Medicine

## 2023-06-12 DIAGNOSIS — T45515A Adverse effect of anticoagulants, initial encounter: Secondary | ICD-10-CM

## 2023-06-12 DIAGNOSIS — R1030 Lower abdominal pain, unspecified: Secondary | ICD-10-CM | POA: Insufficient documentation

## 2023-06-12 MED ORDER — IOHEXOL 9 MG/ML PO SOLN
ORAL | Status: AC
  Filled 2023-06-12: qty 1000

## 2023-06-12 MED ORDER — IOHEXOL 9 MG/ML PO SOLN
500.0000 mL | ORAL | Status: AC
  Administered 2023-06-12: 500 mL via ORAL

## 2023-06-17 ENCOUNTER — Encounter: Payer: Self-pay | Admitting: *Deleted

## 2023-06-18 ENCOUNTER — Ambulatory Visit (HOSPITAL_COMMUNITY)
Admission: RE | Admit: 2023-06-18 | Discharge: 2023-06-18 | Disposition: A | Discharge status: Home / Self Care | Payer: PRIVATE HEALTH INSURANCE | Source: Ambulatory Visit | Attending: Student | Admitting: Student

## 2023-06-18 DIAGNOSIS — I639 Cerebral infarction, unspecified: Secondary | ICD-10-CM

## 2023-06-19 HISTORY — PX: IR RADIOLOGIST EVAL & MGMT: IMG5224

## 2023-06-25 NOTE — Progress Notes (Unsigned)
 Referring Provider:Samantha J, PA-C Primary Care Physician:  Minus Amel, MD Primary Gastroenterologist:  Dr. Riley Cheadle  Chief Complaint  Patient presents with   Abdominal Pain    Having stomach issues and lower back issues. Feels a like shaky and dizzy this morning     HPI:   Dale Perez is a 68 y.o. male presenting today at the request of Dale Cora, PA-C  for stomach pain and back pain.   Today:  Patient presents with his wife today.  Patient reports having lower abdominal pain and back pain. Has been present since he had his stroke in April. Back pain is actually chronic for which he used to take  2 Aleve every day. Since his stroke, Aleve were discontinued due to Brilinta . He was also started on a higher dose statin. Notes that his pain is affected by movement, laying a certain way in the bed will make it worse.  Has tried Tylenol , but this is not helpful.  Pain is not affected by meals or bowel movements though he does report constipation.  Constipation is chronic.  At baseline, used to have a bowel movement every 3 days, now maybe once a week.  Denies BRBPR or melena.  Since stroke, he doesn't have a good appetite. Nothing tate good, so he has no desire to eat.  Wife states he will force himself to eat.  He is lost about 20 pounds.  Denies nausea, vomiting, heartburn symptoms, dysphagia.  They did just purchase boost for him to start drinking.  States, "they are not too bad".   Wt Readings from Last 4 Encounters:  06/26/23 153 lb 12.8 oz (69.8 kg)  06/02/23 163 lb 3.2 oz (74 kg)  05/21/23 176 lb 9.4 oz (80.1 kg)  04/21/23 173 lb 4.8 oz (78.6 kg)     Per chart review: CBC 06/26/2023 within normal limits.  CBC and CMP 06/12/2023 remarkable for glucose 109, alk phos 123, platelets 507.  CT A/P without contrast 06/12/23 with 5.6 cm infrarenal abdominal aortic aneurysm.  CT angio A/P with and without contrast 05/19/2023 5.3 infrarenal abdominal aortic aneurysm with  associated mural thrombus  Hospitalized in April 2025 with acute ischemic stroke:  left MCA,  s/p  L ICA angioplasty with STENT PLACEMENT  and mechanical thrombectomy of left M2 with TICI 3 revascularization.  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  He was started on aspirin  and Brilinta .  Colonoscopy 09/09/2019: Diverticulosis in the sigmoid and descending colon, otherwise normal exam.  Recommended 10-year screening.    Past Medical History:  Diagnosis Date   AAA (abdominal aortic aneurysm) (HCC) 04/2019   4.4cm   Essential hypertension 05/23/2023   Stroke (HCC) 05/2023    Past Surgical History:  Procedure Laterality Date   APPENDECTOMY     COLONOSCOPY  11/2008   Rourk; sigmoid diverticula, otherwise normal exam. Repeat in 2020.    COLONOSCOPY N/A 09/09/2019   Procedure: COLONOSCOPY;  Surgeon: Suzette Espy, MD;  [facility];  Service: Endoscopy;  Laterality: N/A;  9:45am   I & D EXTREMITY Left 08/10/2015   Procedure: IRRIGATION AND DEBRIDEMENT EXTREMITY;  Surgeon: Rober Chimera, MD;  Location: [facility] OR;  Service: Orthopedics;  Laterality: Left;   IR CT HEAD LTD  05/21/2023   IR CT HEAD LTD  05/21/2023   IR INTRAVSC STENT CERV CAROTID W/O EMB-PROT MOD SED INC ANGIO  05/21/2023   IR PERCUTANEOUS ART THROMBECTOMY/INFUSION INTRACRANIAL INC DIAG ANGIO  05/21/2023  IR RADIOLOGIST EVAL & MGMT  06/19/2023   KNEE CARTILAGE SURGERY Right 02/2019   KNEE SURGERY     RADIOLOGY WITH ANESTHESIA N/A 05/21/2023   Procedure: RADIOLOGY WITH ANESTHESIA;  Surgeon: Radiologist, Medication, MD;  Location: [facility] OR;  Service: Radiology;  Laterality: N/A;   SHOULDER ARTHROSCOPY Left 08/2018   SHOULDER SURGERY     TENDON REPAIR N/A 08/10/2015   Procedure: TENDON REPAIR WITH WOUND CLOSURE;  Surgeon: Rober Chimera, MD;  Location: [facility] OR;  Service: Orthopedics;  Laterality: N/A;   TONSILLECTOMY      Current Outpatient Medications  Medication Sig Dispense Refill   aspirin  81 MG  chewable tablet Chew 1 tablet (81 mg total) by mouth daily. 30 tablet 0   atorvastatin  (LIPITOR) 40 MG tablet Take 1 tablet (40 mg total) by mouth daily. 30 tablet 1   ticagrelor  (BRILINTA ) 90 MG TABS tablet Take 1 tablet (90 mg total) by mouth 2 (two) times daily. 180 tablet 0   No current facility-administered medications for this visit.   Facility-Administered Medications Ordered in Other Visits  Medication Dose Route Frequency Provider Last Rate Last Admin   acetaminophen  (TYLENOL ) tablet 650 mg  650 mg Oral Q6H Dorena Gander, MD   650 mg at 04/19/19 1207   HYDROmorphone  (DILAUDID ) injection 1 mg  1 mg Intravenous Q2H PRN Dorena Gander, MD   1 mg at 04/15/19 2255   metoprolol  tartrate (LOPRESSOR ) injection 5 mg  5 mg Intravenous Q6H PRN Adalberto Acton, MD   5 mg at 04/12/19 0146    Allergies as of 06/26/2023 - Review Complete 06/26/2023  Allergen Reaction Noted   Penicillins Other (See Comments) 04/09/2019   Quinolones Other (See Comments) 04/19/2019   Aspirin  Hives, Rash, and Other (See Comments) 08/09/2015    Family History  Problem Relation Age of Onset   Heart attack Father    Colon cancer Neg Hx    Colon polyps Neg Hx     Social History   Socioeconomic History   Marital status: Married    Spouse name: Not on file   Number of children: Not on file   Years of education: Not on file   Highest education level: Not on file  Occupational History   Not on file  Tobacco Use   Smoking status: Former    Current packs/day: 0.00    Average packs/day: 1 pack/day for 40.0 years (40.0 ttl pk-yrs)    Types: Cigarettes    Quit date: 05/21/2023    Years since quitting: 0.1   Smokeless tobacco: Never  Vaping Use   Vaping status: Never Used  Substance and Sexual Activity   Alcohol use: Not Currently    Comment: rarely   Drug use: Never   Sexual activity: Yes  Other Topics Concern   Not on file  Social History Narrative   ** Merged History Encounter **       Social  Drivers of Health   Financial Resource Strain: Not on file  Food Insecurity: No Food Insecurity (05/28/2023)   Hunger Vital Sign    Worried About Running Out of Food in the Last Year: Never true    Ran Out of Food in the Last Year: Never true  Transportation Needs: No Transportation Needs (05/28/2023)   PRAPARE - Administrator, Civil Service (Medical): No    Lack of Transportation (Non-Medical): No  Physical Activity: Not on file  Stress: Not on file  Social Connections: Not on file  Intimate Partner  Violence: Patient Unable To Answer (05/28/2023)   Humiliation, Afraid, Rape, and Kick questionnaire    Fear of Current or Ex-Partner: Patient unable to answer    Emotionally Abused: Patient unable to answer    Physically Abused: Patient unable to answer    Sexually Abused: Patient unable to answer    Review of Systems: Gen: Denies any fever, chills, cold or flulike symptoms, presyncope, syncope. CV: Denies chest pain, heart palpitations.  Resp: Denies shortness of breath, cough. GI: See HPI GU : Denies urinary burning, urinary frequency, urinary hesitancy MS: Denies joint pain.  Derm: Denies rash.  Psych: Denies depression, anxiety.  Heme: See HPI  Physical Exam: BP 137/89 (BP Location: Left Arm, Patient Position: Sitting, Cuff Size: Normal)   Pulse 100   Temp 97.9 F (36.6 C) (Temporal)   Ht 6\' 1"  (1.854 m)   Wt 153 lb 12.8 oz (69.8 kg)   BMI 20.29 kg/m  General:   Alert and oriented. Pleasant and cooperative. Well-nourished and well-developed.  Head:  Normocephalic and atraumatic. Eyes:  Without icterus, sclera clear and conjunctiva pink.  Ears:  Normal auditory acuity. Lungs:  Clear to auscultation bilaterally. No wheezes, rales, or rhonchi. No distress.  Heart:  S1, S2 present without murmurs appreciated.  Abdomen:  +BS, soft, and non-distended.  Mild TTP in RLQ.  No HSM noted. No guarding or rebound. No masses appreciated.  Rectal:  Deferred  Msk:   Symmetrical without gross deformities. Normal posture. Extremities:  Without edema. Neurologic:  Alert and  oriented x4;  grossly normal neurologically. Skin:  Intact without significant lesions or rashes. Psych:  Normal mood.  Flat affect.    Assessment:  68 year old male with history of AAA, HTN, constipation, recent stroke in April 2025 s/p left ICA angioplasty with stent placement and mechanical thrombectomy of left M2 with TICI 3 revascularization, presenting today for further evaluation of lower abdominal pain and back pain at the request of Leverne Reading, PA-C.   Lower abdominal pain/back pain: Patient with chronic back pain for which she used to take 2 Aleve daily until stroke in April where he was started on Brilinta  and no longer able to take NSAID products.  Now having daily back pain and also lower abdominal pain.  He has had 2 CT scans recently including CT angio A/P just before his stroke on 4/15 and CT A/P without contrast on 06/12/2023, both of which showed AAA, also with small nonobstructive right renal calculus on CT in May, but this did not appear to be causing any problems at the time.  He does have constipation which certainly could be contributing to his lower abdominal pain and back pain, but query MSK etiology as he reports pain is affected by movement and does not appear to be affected by meals or bowel movements.   On exam, he has mild TTP in RLQ region.  No CVA tenderness.  History of appendectomy.  At this point, I recommended we focus on improving his bowel regularity and have him follow-up with his primary care doctor for other pain management options for his back.  Could consider a colonoscopy, but will have to wait at least 5 more months when Brilinta  can be held.  I do note however he is not having any rectal bleeding, melena, and his colonoscopy is up-to-date as of 2021 with recommendations to repeat in 10 years.   Constipation: Chronic, but worse since his stroke  in April.  Likely influenced by decreased p.o. intake and activity  level.   Weight loss/altered taste: 23 pound weight loss in a little over 1 month since having a stroke.  Patient reports nothing tastes good anymore, so he has no desire to eat and wife states he would not force himself to eat.  He denies nausea, vomiting, heartburn symptoms, dysphagia, BRBPR, melena.  Colonoscopy on file from 2021 with recommendations to repeat in 10 years.  Recent CT A/P without contrast on 06/12/2023 with nothing that would explain weight loss.  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   I suspect primary etiology of weight loss is poor p.o. intake secondary to loss of taste in the setting of stroke.  Could consider EGD and colonoscopy to rule out other GI causes, but due to recent stroke, will have to wait at least 5 more months when Brilinta  can be held.  He was encouraged to eat 4-6 small meals a day and add 2-3 boost daily to help with weight maintenance.  Plan:  Start MiraLAX  17 g daily. Drink 2-3 boost daily. Eat 4-6 small meals daily. Follow-up with PCP on back pain. Continue to avoid NSAIDs. Okay to take Tylenol .  No more than 3000 mg/day. Follow-up in 8 weeks.   Shana Daring, PA-C [facility] 06/26/2023

## 2023-06-26 ENCOUNTER — Encounter: Payer: Self-pay | Admitting: Gastroenterology

## 2023-06-26 ENCOUNTER — Ambulatory Visit (INDEPENDENT_AMBULATORY_CARE_PROVIDER_SITE_OTHER): Admitting: Gastroenterology

## 2023-06-26 VITALS — BP 137/89 | HR 100 | Temp 97.9°F | Ht 73.0 in | Wt 153.8 lb

## 2023-06-26 DIAGNOSIS — K5900 Constipation, unspecified: Secondary | ICD-10-CM | POA: Diagnosis not present

## 2023-06-26 DIAGNOSIS — M549 Dorsalgia, unspecified: Secondary | ICD-10-CM

## 2023-06-26 DIAGNOSIS — R634 Abnormal weight loss: Secondary | ICD-10-CM

## 2023-06-26 DIAGNOSIS — R432 Parageusia: Secondary | ICD-10-CM

## 2023-06-26 DIAGNOSIS — R1030 Lower abdominal pain, unspecified: Secondary | ICD-10-CM

## 2023-06-26 DIAGNOSIS — M545 Low back pain, unspecified: Secondary | ICD-10-CM

## 2023-06-26 DIAGNOSIS — R10813 Right lower quadrant abdominal tenderness: Secondary | ICD-10-CM | POA: Diagnosis not present

## 2023-06-26 NOTE — Patient Instructions (Signed)
 Start MiraLAX  17 g (1 capful) daily in 8 oz of water  or other noncarbonated beverage of your choice to help with constipation.  Drink 2-3 boost daily to help with weight maintenance.  Try eating 4-6 small meals daily.  Follow-up with primary care on management of back pain.  Continue to avoid all NSAID products.  You can take Tylenol .  No more than 3000 mg of Tylenol  per 24 hours.  Please follow-up with your primary care doctor and contact neurology regarding fluctuating blood pressure.  Shana Daring, PA-C [facility]

## 2023-06-28 ENCOUNTER — Encounter: Payer: Self-pay | Admitting: Gastroenterology

## 2023-07-03 ENCOUNTER — Telehealth: Payer: Self-pay | Admitting: *Deleted

## 2023-07-03 NOTE — Telephone Encounter (Signed)
 Triage call regarding abdominal pain and back pain , pt followed up with PCP who gave him a steroid injection and tramadol  which seems to have helped. New CT abd was done on 05/09 which results show increase in size from 5.3 to 5.6.  Per clark I will add pt on 06/3 for follow up regarding AAA.

## 2023-07-07 ENCOUNTER — Encounter: Payer: Self-pay | Admitting: Vascular Surgery

## 2023-07-07 ENCOUNTER — Ambulatory Visit (INDEPENDENT_AMBULATORY_CARE_PROVIDER_SITE_OTHER): Payer: PRIVATE HEALTH INSURANCE | Admitting: Vascular Surgery

## 2023-07-07 VITALS — BP 136/68 | HR 77 | Ht 73.0 in | Wt 151.4 lb

## 2023-07-07 DIAGNOSIS — I7143 Infrarenal abdominal aortic aneurysm, without rupture: Secondary | ICD-10-CM

## 2023-07-07 NOTE — Progress Notes (Signed)
 Patient name: Dale Perez MRN: [id number] DOB: 1956-01-28 Sex: male  REASON FOR CONSULT: Triage for abdominal aortic aneurysm  HPI: Dale Perez is a 68 y.o. male, with history of tobacco abuse who presents for triage for abdominal aortic aneurysm.    Last seen on 06/02/2023 with a ~5 cm AAA.  Had a repeat CT on 06/12/2023 with concern that this was 5.6 cm.  According to the patient and his wife this was ordered by his PCP for abdominal pain about 2 to 3 weeks ago.  Abdominal pain he states radiated around his side to his back bilaterally.  This is getting better.  Recently admitted on 05/21/23 with left ICA occlusion and underwent thrombectomy with IR including a left ICA stent and mechanical thrombectomy of the left M2.  On Brilinta  for 6 months.  Past Medical History:  Diagnosis Date   AAA (abdominal aortic aneurysm) (HCC) 04/2019   4.4cm   Essential hypertension 05/23/2023   Stroke (HCC) 05/2023    Past Surgical History:  Procedure Laterality Date   APPENDECTOMY     COLONOSCOPY  11/2008   Rourk; sigmoid diverticula, otherwise normal exam. Repeat in 2020.    COLONOSCOPY N/A 09/09/2019   Procedure: COLONOSCOPY;  Surgeon: Suzette Espy, MD;  [facility]: [facility];  Service: Endoscopy;  Laterality: N/A;  9:45am   I & D EXTREMITY Left 08/10/2015   Procedure: IRRIGATION AND DEBRIDEMENT EXTREMITY;  Surgeon: Rober Chimera, MD;  Location: [facility] OR;  Service: Orthopedics;  Laterality: Left;   IR CT HEAD LTD  05/21/2023   IR CT HEAD LTD  05/21/2023   IR INTRAVSC STENT CERV CAROTID W/O EMB-PROT MOD SED INC ANGIO  05/21/2023   IR PERCUTANEOUS ART THROMBECTOMY/INFUSION INTRACRANIAL INC DIAG ANGIO  05/21/2023   IR RADIOLOGIST EVAL & MGMT  06/19/2023   KNEE CARTILAGE SURGERY Right 02/2019   KNEE SURGERY     RADIOLOGY WITH ANESTHESIA N/A 05/21/2023   Procedure: RADIOLOGY WITH ANESTHESIA;  Surgeon: Radiologist, Medication, MD;  Location: [facility] OR;  Service: Radiology;  Laterality: N/A;    SHOULDER ARTHROSCOPY Left 08/2018   SHOULDER SURGERY     TENDON REPAIR N/A 08/10/2015   Procedure: TENDON REPAIR WITH WOUND CLOSURE;  Surgeon: Rober Chimera, MD;  Location: [facility] OR;  Service: Orthopedics;  Laterality: N/A;   TONSILLECTOMY      Family History  Problem Relation Age of Onset   Heart attack Father    Colon cancer Neg Hx    Colon polyps Neg Hx     SOCIAL HISTORY: Social History   Socioeconomic History   Marital status: Married    Spouse name: Not on file   Number of children: Not on file   Years of education: Not on file   Highest education level: Not on file  Occupational History   Not on file  Tobacco Use   Smoking status: Former    Current packs/day: 0.00    Average packs/day: 1 pack/day for 40.0 years (40.0 ttl pk-yrs)    Types: Cigarettes    Quit date: 05/21/2023    Years since quitting: 0.1   Smokeless tobacco: Never  Vaping Use   Vaping status: Never Used  Substance and Sexual Activity   Alcohol use: Not Currently    Comment: rarely   Drug use: Never   Sexual activity: Yes  Other Topics Concern   Not on file  Social History Narrative   ** Merged History Encounter **  Social Drivers of Corporate investment banker Strain: Not on file  Food Insecurity: No Food Insecurity (05/28/2023)   Hunger Vital Sign    Worried About Running Out of Food in the Last Year: Never true    Ran Out of Food in the Last Year: Never true  Transportation Needs: No Transportation Needs (05/28/2023)   PRAPARE - Administrator, Civil Service (Medical): No    Lack of Transportation (Non-Medical): No  Physical Activity: Not on file  Stress: Not on file  Social Connections: Not on file  Intimate Partner Violence: Patient Unable To Answer (05/28/2023)   Humiliation, Afraid, Rape, and Kick questionnaire    Fear of Current or Ex-Partner: Patient unable to answer    Emotionally Abused: Patient unable to answer    Physically Abused: Patient unable to answer     Sexually Abused: Patient unable to answer    Allergies  Allergen Reactions   Penicillins Other (See Comments)    Reaction unconfirmed- was stopped in the hospital after the initial reaction however (per family).   Quinolones Other (See Comments)    Patient has an AAA (Abdominal Aortic Aneurysm)   Aspirin  Hives, Rash and Other (See Comments)    Reaction happened in childhood, but has been able to take 81 mg since then, however    Current Outpatient Medications  Medication Sig Dispense Refill   aspirin  81 MG chewable tablet Chew 1 tablet (81 mg total) by mouth daily. 30 tablet 0   atorvastatin  (LIPITOR) 40 MG tablet Take 1 tablet (40 mg total) by mouth daily. 30 tablet 1   cyclobenzaprine (FLEXERIL) 10 MG tablet      dronabinol (MARINOL) 2.5 MG capsule      olmesartan (BENICAR) 40 MG tablet      polyethylene glycol powder (MIRALAX ) 17 GM/SCOOP powder      predniSONE (DELTASONE) 10 MG tablet Take by mouth.     ticagrelor  (BRILINTA ) 90 MG TABS tablet Take 1 tablet (90 mg total) by mouth 2 (two) times daily. 180 tablet 0   No current facility-administered medications for this visit.   Facility-Administered Medications Ordered in Other Visits  Medication Dose Route Frequency Provider Last Rate Last Admin   acetaminophen  (TYLENOL ) tablet 650 mg  650 mg Oral Q6H Dorena Gander, MD   650 mg at 04/19/19 1207   HYDROmorphone  (DILAUDID ) injection 1 mg  1 mg Intravenous Q2H PRN Dorena Gander, MD   1 mg at 04/15/19 2255   metoprolol  tartrate (LOPRESSOR ) injection 5 mg  5 mg Intravenous Q6H PRN Adalberto Acton, MD   5 mg at 04/12/19 0146    REVIEW OF SYSTEMS:  [X]  denotes positive finding, [ ]  denotes negative finding Cardiac  Comments:  Chest pain or chest pressure:    Shortness of breath upon exertion:    Short of breath when lying flat:    Irregular heart rhythm:        Vascular    Pain in calf, thigh, or hip brought on by ambulation:    Pain in feet at night that wakes you up  from your sleep:     Blood clot in your veins:    Leg swelling:         Pulmonary    Oxygen at home:    Productive cough:     Wheezing:         Neurologic    Sudden weakness in arms or legs:     Sudden numbness in arms  or legs:     Sudden onset of difficulty speaking or slurred speech:    Temporary loss of vision in one eye:     Problems with dizziness:         Gastrointestinal    Blood in stool:     Vomited blood:         Genitourinary    Burning when urinating:     Blood in urine:        Psychiatric    Major depression:         Hematologic    Bleeding problems:    Problems with blood clotting too easily:        Skin    Rashes or ulcers:        Constitutional    Fever or chills:      PHYSICAL EXAM: Vitals:   07/07/23 1415 07/07/23 1418  BP: 111/73 136/68  Pulse: 77   SpO2: 97%   Weight: 151 lb 6.4 oz (68.7 kg)   Height: 6\' 1"  (1.854 m)      GENERAL: The patient is a well-nourished male, in no acute distress. The vital signs are documented above. CARDIAC: There is a regular rate and rhythm.  VASCULAR:  Palpable femoral pulses bilateral groins. Palpable DP/PT pulses bilateral lower extremities. PULMONARY: No respiratory distress. ABDOMEN: Soft and non-tender.  No pain with palpation of aneurysm. MUSCULOSKELETAL: There are no major deformities or cyanosis. NEUROLOGIC: No focal weakness or paresthesias are detected. PSYCHIATRIC: The patient has a normal affect.  DATA:   CT abdomen pelvis 06/12/2023 without contrast on my review shows a 5.2 cm AAA  AAA duplex 04/21/23 shows his aneurysm has increased 5.04 cm ---> 5.41 cm over the past 6 months  Assessment/Plan:  68 year old male presents for triage visit for evaluation of abdominal aortic aneurysm.  He was just seen in April after CTA that showed an aneurysm that did not meet criteria for repair and was <5.5 cm and he had just had a stroke requiring ICA thrombectomy with stent.  Now back for triage visit  after he had a CT without contrast ordered by his PCP that radiology stated his aneurysm was 5.6 cm.  I reviewed the imaging and I think this is an aggressive measurement.  I get about 5.2 cm on coronal and sagittal imaging.  Discussed we can certainly make plans to proceed with repair if ongoing concerns.  Fortunately he has no pain with palpation of his aneurysm and I do not think he has a symptomatic aneurysm as we discussed today.  He needs to be off Brilinta  for EVAR and unfortunately he needs to stay on this for 6 months per IR after recent ICA thrombectomy with stent.  I will plan to keep his follow-up in October when he should be off his Brilinta  and then we can hopefully make plans for repair with AAA duplex at that time as well.   Young Hensen, MD [facility] Office: [phone]

## 2023-08-12 ENCOUNTER — Encounter: Payer: Self-pay | Admitting: Neurology

## 2023-08-12 ENCOUNTER — Ambulatory Visit (INDEPENDENT_AMBULATORY_CARE_PROVIDER_SITE_OTHER): Admitting: Neurology

## 2023-08-12 VITALS — BP 110/76 | HR 77 | Ht 73.0 in | Wt 151.0 lb

## 2023-08-12 DIAGNOSIS — I6522 Occlusion and stenosis of left carotid artery: Secondary | ICD-10-CM | POA: Diagnosis not present

## 2023-08-12 DIAGNOSIS — I63512 Cerebral infarction due to unspecified occlusion or stenosis of left middle cerebral artery: Secondary | ICD-10-CM

## 2023-08-12 MED ORDER — TICAGRELOR 90 MG PO TABS: 90.0000 mg | ORAL_TABLET | Freq: Two times a day (BID) | ORAL | 0 refills | Status: DC

## 2023-08-12 NOTE — Patient Instructions (Signed)
 I had a long d/w patient and his wife about his recent stroke, carotid occlusion and thrombectomy and stenting, risk for recurrent stroke/TIAs, personally independently reviewed imaging studies and stroke evaluation results and answered questions.Continue aspirin  81 mg and Brilinta  90 mg twice daily for total of 6 months following his stroke and then stop Brilinta  and stay on aspirin  alone for secondary stroke prevention and maintain strict control of hypertension with blood pressure goal below 130/90, diabetes with hemoglobin A1c goal below 6.5% and lipids with LDL cholesterol goal below 70 mg/dL. I also advised the patient to eat a healthy diet with plenty of whole grains, cereals, fruits and vegetables, exercise regularly and maintain ideal body weight .check follow-up screening carotid ultrasound study.  Followup in the future with me in 6 months or call earlier if necessary.  Stroke Prevention Some medical conditions and behaviors can lead to a higher chance of having a stroke. You can help prevent a stroke by eating healthy, exercising, not smoking, and managing any medical conditions you have. Stroke is a leading cause of functional impairment. Primary prevention is particularly important because a majority of strokes are first-time events. Stroke changes the lives of not only those who experience a stroke but also their family and other caregivers. How can this condition affect me? A stroke is a medical emergency and should be treated right away. A stroke can lead to brain damage and can sometimes be life-threatening. If a person gets medical treatment right away, there is a better chance of surviving and recovering from a stroke. What can increase my risk? The following medical conditions may increase your risk of a stroke: Cardiovascular disease. High blood pressure (hypertension). Diabetes. High cholesterol. Sickle cell disease. Blood clotting disorders (hypercoagulable  state). Obesity. Sleep disorders (obstructive sleep apnea). Other risk factors include: Being older than age 79. Having a history of blood clots, stroke, or mini-stroke (transient ischemic attack, TIA). Genetic factors, such as race, ethnicity, or a family history of stroke. Smoking cigarettes or using other tobacco products. Taking birth control pills, especially if you also use tobacco. Heavy use of alcohol or drugs, especially cocaine and methamphetamine. Physical inactivity. What actions can I take to prevent this? Manage your health conditions High cholesterol levels. Eating a healthy diet is important for preventing high cholesterol. If cholesterol cannot be managed through diet alone, you may need to take medicines. Take any prescribed medicines to control your cholesterol as told by your health care provider. Hypertension. To reduce your risk of stroke, try to keep your blood pressure below 130/80. Eating a healthy diet and exercising regularly are important for controlling blood pressure. If these steps are not enough to manage your blood pressure, you may need to take medicines. Take any prescribed medicines to control hypertension as told by your health care provider. Ask your health care provider if you should monitor your blood pressure at home. Have your blood pressure checked every year, even if your blood pressure is normal. Blood pressure increases with age and some medical conditions. Diabetes. Eating a healthy diet and exercising regularly are important parts of managing your blood sugar (glucose). If your blood sugar cannot be managed through diet and exercise, you may need to take medicines. Take any prescribed medicines to control your diabetes as told by your health care provider. Get evaluated for obstructive sleep apnea. Talk to your health care provider about getting a sleep evaluation if you snore a lot or have excessive sleepiness. Make sure that any other  medical conditions you have, such as atrial fibrillation or atherosclerosis, are managed. Nutrition Follow instructions from your health care provider about what to eat or drink to help manage your health condition. These instructions may include: Reducing your daily calorie intake. Limiting how much salt (sodium) you use to 1,500 milligrams (mg) each day. Using only healthy fats for cooking, such as olive oil, canola oil, or sunflower oil. Eating healthy foods. You can do this by: Choosing foods that are high in fiber, such as whole grains, and fresh fruits and vegetables. Eating at least 5 servings of fruits and vegetables a day. Try to fill one-half of your plate with fruits and vegetables at each meal. Choosing lean protein foods, such as lean cuts of meat, poultry without skin, fish, tofu, beans, and nuts. Eating low-fat dairy products. Avoiding foods that are high in sodium. This can help lower blood pressure. Avoiding foods that have saturated fat, trans fat, and cholesterol. This can help prevent high cholesterol. Avoiding processed and prepared foods. Counting your daily carbohydrate intake.  Lifestyle If you drink alcohol: Limit how much you have to: 0-1 drink a day for women who are not pregnant. 0-2 drinks a day for men. Know how much alcohol is in your drink. In the [place] one drink equals one 12 oz bottle of beer ( ), one 5 oz glass of wine ( ), or one 1 oz glass of hard liquor (44mL). Do not use any products that contain nicotine  or tobacco. These products include cigarettes, chewing tobacco, and vaping devices, such as e-cigarettes. If you need help quitting, ask your health care provider. Avoid secondhand smoke. Do not use drugs. Activity  Try to stay at a healthy weight. Get at least 30 minutes of exercise on most days, such as: Fast walking. Biking. Swimming. Medicines Take over-the-counter and prescription medicines only as told by your health care  provider. Aspirin  or blood thinners (antiplatelets or anticoagulants) may be recommended to reduce your risk of forming blood clots that can lead to stroke. Avoid taking birth control pills. Talk to your health care provider about the risks of taking birth control pills if: You are over 49 years old. You smoke. You get very bad headaches. You have had a blood clot. Where to find more information American Stroke Association: www.strokeassociation.org Get help right away if: You or a loved one has any symptoms of a stroke. BE FAST is an easy way to remember the main warning signs of a stroke: B - Balance. Signs are dizziness, sudden trouble walking, or loss of balance. E - Eyes. Signs are trouble seeing or a sudden change in vision. F - Face. Signs are sudden weakness or numbness of the face, or the face or eyelid drooping on one side. A - Arms. Signs are weakness or numbness in an arm. This happens suddenly and usually on one side of the body. S - Speech. Signs are sudden trouble speaking, slurred speech, or trouble understanding what people say. T - Time. Time to call emergency services. Write down what time symptoms started. You or a loved one has other signs of a stroke, such as: A sudden, severe headache with no known cause. Nausea or vomiting. Seizure. These symptoms may represent a serious problem that is an emergency. Do not wait to see if the symptoms will go away. Get medical help right away. Call your local emergency services (911 in the [place] Do not drive yourself to the hospital. Summary You can help to prevent a stroke  by eating healthy, exercising, not smoking, limiting alcohol intake, and managing any medical conditions you may have. Do not use any products that contain nicotine  or tobacco. These include cigarettes, chewing tobacco, and vaping devices, such as e-cigarettes. If you need help quitting, ask your health care provider. Remember BE FAST for warning signs of a  stroke. Get help right away if you or a loved one has any of these signs. This information is not intended to replace advice given to you by your health care provider. Make sure you discuss any questions you have with your health care provider. Document Revised: 12/23/2021 Document Reviewed: 12/23/2021 Elsevier Patient Education  2024 [employer].

## 2023-08-12 NOTE — Progress Notes (Signed)
 [facility] [address] [place]. [place] [address] [phone]       OFFICE FOLLOW-UP NOTE  Mr. Dale Perez Date of Birth:  November 17, 1955 Medical Record Number:  [id number]   HPI: Mr. Dendinger is a 68 year old pleasant Caucasian male seen today for initial office follow-up visit following hospital consultation.  In April 2025.  He is accompanied by his wife.  History is obtained from them and review of electronic medical records.  I personally reviewed pertinent available imaging films in PACS.  He has past medical history of hyperlipidemia, hypertension, abdominal aortic aneurysm who presented on 05/21/23 for evaluation for sudden onset of right-sided numbness and weakness in speech difficulties.  He was last seen normal the night before he went to bed.  In the morning the wife woke up and noted that the patient was mumbling and his speech was not making sense.  He also did not know how to put on his shirt and could not button his pants.  He was subsequently noted to have right facial droop as well.  EMS brought him to the hospital as a code stroke.  CT head showed no acute abnormalities but CT angiogram showed proximal left ICA occlusion in the neck extending up to the mid petrous segment with a questionable string sign.  CT perfusion showed no core infarct but the large penumbra of 107 mL.  Patient was taken for emergent mechanical thrombectomy which was performed uneventfully.  Patient did well postprocedure speech and right-sided weakness improved.  MRI scan showed only small scattered left MCA territory infarcts.  Echocardiogram showed ejection fraction of 60 to 65%.  Left atrial size is normal.  LDL cholesterol is 88 mg percent.  Hemoglobin A1c was 5.8.  Urine drug screen was positive only for benzos.  Patient was started on aspirin  and Brilinta  for proximal left carotid rescue stent.  He was counseled to quit smoking which she states she has done completely.  He is doing  well.  He still has some mild residual numbness in his right hand as well as some drainage fine motor skills and grip weakness.  He however states he has lost significant appetite as well as weight.  He also complains of feeling generalized weak.  He is finished home speech therapy and did not need any occupational therapy or physical therapy.  He remains on aspirin  and Brilinta  which is tolerating well with minor bruising and no bleeding.  He does have an appointment coming up with primary care physician to discuss his weight loss and poor appetite.  Blood pressure is well-controlled on Benicar.  ROS:   14 system review of systems is positive for tingling, numbness, and and grip weakness, bruising, generalized weakness, poor appetite, weight loss and all other systems negative  PMH:  Past Medical History:  Diagnosis Date   AAA (abdominal aortic aneurysm) (HCC) 04/2019   4.4cm   Essential hypertension 05/23/2023   Stroke (HCC) 05/2023    Social History:  Social History   Socioeconomic History   Marital status: Married    Spouse name: Not on file   Number of children: Not on file   Years of education: Not on file   Highest education level: Not on file  Occupational History   Not on file  Tobacco Use   Smoking status: Former    Current packs/day: 0.00    Average packs/day: 1 pack/day for 40.0 years (40.0 ttl pk-yrs)    Types: Cigarettes    Quit date:  05/21/2023    Years since quitting: 0.2   Smokeless tobacco: Never  Vaping Use   Vaping status: Never Used  Substance and Sexual Activity   Alcohol use: Not Currently    Comment: rarely   Drug use: Never   Sexual activity: Yes  Other Topics Concern   Not on file  Social History Narrative   ** Merged History Encounter **       Social Drivers of Health   Financial Resource Strain: Not on file  Food Insecurity: No Food Insecurity (05/28/2023)   Hunger Vital Sign    Worried About Running Out of Food in the Last Year: Never true     Ran Out of Food in the Last Year: Never true  Transportation Needs: No Transportation Needs (05/28/2023)   PRAPARE - Administrator, Civil Service (Medical): No    Lack of Transportation (Non-Medical): No  Physical Activity: Not on file  Stress: Not on file  Social Connections: Not on file  Intimate Partner Violence: Patient Unable To Answer (05/28/2023)   Humiliation, Afraid, Rape, and Kick questionnaire    Fear of Current or Ex-Partner: Patient unable to answer    Emotionally Abused: Patient unable to answer    Physically Abused: Patient unable to answer    Sexually Abused: Patient unable to answer    Medications:   Current Outpatient Medications on File Prior to Visit  Medication Sig Dispense Refill   aspirin  81 MG chewable tablet Chew 1 tablet (81 mg total) by mouth daily. 30 tablet 0   atorvastatin  (LIPITOR) 40 MG tablet Take 1 tablet (40 mg total) by mouth daily. 30 tablet 1   olmesartan (BENICAR) 40 MG tablet      ticagrelor  (BRILINTA ) 90 MG TABS tablet Take 1 tablet (90 mg total) by mouth 2 (two) times daily. 180 tablet 0   Current Facility-Administered Medications on File Prior to Visit  Medication Dose Route Frequency Provider Last Rate Last Admin   acetaminophen  (TYLENOL ) tablet 650 mg  650 mg Oral Q6H Sebastian Moles, MD   650 mg at 04/19/19 1207   HYDROmorphone  (DILAUDID ) injection 1 mg  1 mg Intravenous Q2H PRN Sebastian Moles, MD   1 mg at 04/15/19 2255   metoprolol  tartrate (LOPRESSOR ) injection 5 mg  5 mg Intravenous Q6H PRN Signe Mitzie LABOR, MD   5 mg at 04/12/19 0146    Allergies:   Allergies  Allergen Reactions   Penicillins Other (See Comments)    Reaction unconfirmed- was stopped in the hospital after the initial reaction however (per family).   Quinolones Other (See Comments)    Patient has an AAA (Abdominal Aortic Aneurysm)   Aspirin  Hives, Rash and Other (See Comments)    Reaction happened in childhood, but has been able to take 81 mg  since then, however    Physical Exam General: well developed, well nourished pleasant middle-aged Caucasian male, seated, in no evident distress Head: head normocephalic and atraumatic.  Neck: supple with no carotid or supraclavicular bruits Cardiovascular: regular rate and rhythm, no murmurs Musculoskeletal: no deformity Skin:  no rash/petichiae Vascular:  Normal pulses all extremities Vitals:   08/12/23 1024  BP: 110/76  Pulse: 77   Neurologic Exam Mental Status: Awake and fully alert. Oriented to place and time. Recent and remote memory intact. Attention span, concentration and fund of knowledge appropriate. Mood and affect appropriate.  Cranial Nerves: Fundoscopic exam reveals sharp disc margins. Pupils equal, briskly reactive to light. Extraocular movements full without  nystagmus. Visual fields full to confrontation. Hearing intact. Facial sensation intact. Face, tongue, palate moves normally and symmetrically.  Motor: Normal bulk and tone. Normal strength in all tested extremity muscles.  Mild weakness of right grip and intrinsic hand muscles.  Diminished fine finger movements on the right.  Albeit left over right upper extremity. Sensory.: intact to touch ,pinprick .position and vibratory sensation diminished sensation in the right hand in the thumb and index finger..  Coordination: Rapid alternating movements normal in all extremities. Finger-to-nose and heel-to-shin performed accurately bilaterally. Gait and Station: Arises from chair without difficulty. Stance is normal. Gait demonstrates normal stride length and balance . Able to heel, toe and tandem walk without difficulty.  Reflexes: 1+ and symmetric. Toes downgoing.   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  He is  doing phenomenally well with mild residual right hand paresthesias and weakness only.  Vascular risk factors of carotid disease, smoking, hypertension and hyperlipidemia.     PLAN:I had a long d/w patient and his wife about his recent stroke, carotid occlusion and thrombectomy and stenting, risk for recurrent stroke/TIAs, personally independently reviewed imaging studies and stroke evaluation results and answered questions.Continue aspirin  81 mg and Brilinta  90 mg twice daily for total of 6 months following his stroke and then stop Brilinta  and stay on aspirin  alone for secondary stroke prevention and maintain strict control of hypertension with blood pressure goal below 130/90, diabetes with hemoglobin A1c goal below 6.5% and lipids with LDL cholesterol goal below 70 mg/dL. I also advised the patient to eat a healthy diet with plenty of whole grains, cereals, fruits and vegetables, exercise regularly and maintain ideal body weight .I have complemented him on smoking cessation and encouraged him to continue to do so.  Check follow-up screening carotid ultrasound study.  Followup in the future with me in 6 months or call earlier if necessary.  Greater than 50% of time during this 35 minute visit was spent on counseling,explanation of diagnosis of stroke, carotid occlusion, carotid stenting, planning of further management, discussion with patient and family and coordination of care Eather Popp, MD Note: This document was prepared with digital dictation and possible smart phrase technology. Any transcriptional errors that result from this process are unintentional

## 2023-08-21 ENCOUNTER — Ambulatory Visit: Payer: PRIVATE HEALTH INSURANCE | Admitting: Gastroenterology

## 2023-08-30 ENCOUNTER — Emergency Department (HOSPITAL_COMMUNITY)

## 2023-08-30 ENCOUNTER — Other Ambulatory Visit: Payer: Self-pay

## 2023-08-30 ENCOUNTER — Encounter (HOSPITAL_COMMUNITY): Payer: Self-pay

## 2023-08-30 ENCOUNTER — Emergency Department (HOSPITAL_COMMUNITY): Admission: EM | Admit: 2023-08-30 | Discharge: 2023-08-30 | Disposition: A | Discharge status: Home / Self Care

## 2023-08-30 DIAGNOSIS — Z79899 Other long term (current) drug therapy: Secondary | ICD-10-CM | POA: Insufficient documentation

## 2023-08-30 DIAGNOSIS — Z7982 Long term (current) use of aspirin: Secondary | ICD-10-CM | POA: Diagnosis not present

## 2023-08-30 DIAGNOSIS — I10 Essential (primary) hypertension: Secondary | ICD-10-CM | POA: Diagnosis not present

## 2023-08-30 DIAGNOSIS — T675XXA Heat exhaustion, unspecified, initial encounter: Secondary | ICD-10-CM | POA: Insufficient documentation

## 2023-08-30 DIAGNOSIS — E860 Dehydration: Secondary | ICD-10-CM | POA: Diagnosis not present

## 2023-08-30 DIAGNOSIS — R4182 Altered mental status, unspecified: Secondary | ICD-10-CM | POA: Insufficient documentation

## 2023-08-30 DIAGNOSIS — X30XXXA Exposure to excessive natural heat, initial encounter: Secondary | ICD-10-CM | POA: Insufficient documentation

## 2023-08-30 LAB — URINALYSIS, ROUTINE W REFLEX MICROSCOPIC
Bilirubin Urine: NEGATIVE
Glucose, UA: NEGATIVE mg/dL
Hgb urine dipstick: NEGATIVE
Ketones, ur: NEGATIVE mg/dL
Leukocytes,Ua: NEGATIVE
Nitrite: NEGATIVE
Protein, ur: NEGATIVE mg/dL
Specific Gravity, Urine: 1.012 (ref 1.005–1.030)
pH: 5 (ref 5.0–8.0)

## 2023-08-30 LAB — CBC WITH DIFFERENTIAL/PLATELET
Abs Immature Granulocytes: 0.09 K/uL — ABNORMAL HIGH (ref 0.00–0.07)
Basophils Absolute: 0.1 K/uL (ref 0.0–0.1)
Basophils Relative: 1 %
Eosinophils Absolute: 0.6 K/uL — ABNORMAL HIGH (ref 0.0–0.5)
Eosinophils Relative: 7 %
HCT: 38.9 % — ABNORMAL LOW (ref 39.0–52.0)
Hemoglobin: 12.1 g/dL — ABNORMAL LOW (ref 13.0–17.0)
Immature Granulocytes: 1 %
Lymphocytes Relative: 25 %
Lymphs Abs: 2.2 K/uL (ref 0.7–4.0)
MCH: 28 pg (ref 26.0–34.0)
MCHC: 31.1 g/dL (ref 30.0–36.0)
MCV: 90 fL (ref 80.0–100.0)
Monocytes Absolute: 0.6 K/uL (ref 0.1–1.0)
Monocytes Relative: 7 %
Neutro Abs: 5.3 K/uL (ref 1.7–7.7)
Neutrophils Relative %: 59 %
Platelets: 279 K/uL (ref 150–400)
RBC: 4.32 MIL/uL (ref 4.22–5.81)
RDW: 17.1 % — ABNORMAL HIGH (ref 11.5–15.5)
WBC: 8.9 K/uL (ref 4.0–10.5)
nRBC: 0 % (ref 0.0–0.2)

## 2023-08-30 LAB — BASIC METABOLIC PANEL WITH GFR
Anion gap: 14 (ref 5–15)
BUN: 18 mg/dL (ref 8–23)
CO2: 18 mmol/L — ABNORMAL LOW (ref 22–32)
Calcium: 9 mg/dL (ref 8.9–10.3)
Chloride: 109 mmol/L (ref 98–111)
Creatinine, Ser: 1.14 mg/dL (ref 0.61–1.24)
GFR, Estimated: >60 mL/min (ref >60)
Glucose, Bld: 107 mg/dL — ABNORMAL HIGH (ref 70–99)
Potassium: 4 mmol/L (ref 3.5–5.1)
Sodium: 141 mmol/L (ref 135–145)

## 2023-08-30 LAB — CK: Total CK: 48 U/L — ABNORMAL LOW (ref 49–397)

## 2023-08-30 LAB — TROPONIN I (HIGH SENSITIVITY)
Troponin I (High Sensitivity): 6 ng/L (ref <18)
Troponin I (High Sensitivity): 7 ng/L (ref <18)

## 2023-08-30 MED ORDER — SODIUM CHLORIDE 0.9 % IV BOLUS
500.0000 mL | Freq: Once | INTRAVENOUS | Status: AC
  Administered 2023-08-30: 500 mL via INTRAVENOUS

## 2023-08-30 NOTE — Discharge Instructions (Signed)
 Drink lots of fluids and try to avoid the heat.  Stay on your medications as prescribed and call your primary care doctor tomorrow to see if you can move your follow-up appointment to an earlier date than August 15.  Return to the ER for new or worsening symptoms.

## 2023-08-30 NOTE — ED Notes (Signed)
 ED Provider at bedside.

## 2023-08-30 NOTE — ED Triage Notes (Signed)
 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 Pt had 1L NS PTA with initial BP upon arrival of EMS being 71/51 89 HR, 97% RA, CBG 115. BP went up to 115/62 but then came back down to 95/61. Pt Reports history of AAA that has been monitored for 2 years and a stroke in April. Pt A&Ox4 upon arrival stating he is feeling better. ED MD at bedside.

## 2023-08-30 NOTE — ED Provider Notes (Signed)
 [place] EMERGENCY DEPARTMENT AT [facility] Provider Note   CSN: [id number] Arrival date & time: 08/30/23  1651     Patient presents with: Heat Exposure   Dale Perez is a 68 y.o. male.   68 year old male presents for evaluation of altered mental status.  Per EMS he had been working outside today, daughter found him altered clammy and diaphoretic and confused.  They gave him 500 cc of IV fluids as he is initially hypotensive and blood pressure improved.  Patient states he was lightheaded a little bit afterwards but now feeling much better.  He denies any other symptoms or concerns at this time.        Prior to Admission medications   Medication Sig Start Date End Date Taking? Authorizing Provider  acetaminophen  (TYLENOL ) 500 MG tablet Take 1,000 mg by mouth every 6 (six) hours as needed for mild pain (pain score 1-3).   Yes [provider]  aspirin  81 MG chewable tablet Chew 1 tablet (81 mg total) by mouth daily. 05/24/23  Yes Waddell Karna LABOR, NP  atorvastatin  (LIPITOR) 40 MG tablet Take 1 tablet (40 mg total) by mouth daily. 05/24/23  Yes Waddell Karna LABOR, NP  olmesartan (BENICAR) 40 MG tablet Take 40 mg by mouth daily. 06/30/23  Yes [provider]  ticagrelor  (BRILINTA ) 90 MG TABS tablet Take 1 tablet (90 mg total) by mouth 2 (two) times daily. 08/12/23  Yes Rosemarie Eather RAMAN, MD    Allergies: Quinolones, Aspirin , and Penicillins    Review of Systems  Constitutional:  Negative for chills and fever.  HENT:  Negative for ear pain and sore throat.   Eyes:  Negative for pain and visual disturbance.  Respiratory:  Negative for cough and shortness of breath.   Cardiovascular:  Negative for chest pain and palpitations.  Gastrointestinal:  Negative for abdominal pain and vomiting.  Genitourinary:  Negative for dysuria and hematuria.  Musculoskeletal:  Negative for arthralgias and back pain.  Skin:  Negative for color change and rash.  Neurological:   Negative for seizures and syncope.  All other systems reviewed and are negative.   Updated Vital Signs BP (!) 144/88   Pulse 67   Temp 98.5 F (36.9 C) (Oral)   Resp 16   Ht 6' 1 (1.854 m)   Wt 70.8 kg   SpO2 98%   BMI 20.58 kg/m   Physical Exam Vitals and nursing note reviewed.  Constitutional:      General: He is not in acute distress.    Appearance: Normal appearance. He is well-developed. He is not ill-appearing.  HENT:     Head: Normocephalic and atraumatic.  Eyes:     Conjunctiva/sclera: Conjunctivae normal.  Cardiovascular:     Rate and Rhythm: Normal rate and regular rhythm.     Pulses: Normal pulses.     Heart sounds: Normal heart sounds. No murmur heard. Pulmonary:     Effort: Pulmonary effort is normal. No respiratory distress.     Breath sounds: Normal breath sounds. No stridor. No wheezing or rhonchi.  Abdominal:     Palpations: Abdomen is soft.     Tenderness: There is no abdominal tenderness.  Musculoskeletal:        General: No swelling.     Cervical back: Neck supple.  Skin:    General: Skin is warm and dry.     Capillary Refill: Capillary refill takes less than 2 seconds.  Neurological:     General: No focal  deficit present.     Mental Status: He is alert.  Psychiatric:        Mood and Affect: Mood normal.     (all labs ordered are listed, but only abnormal results are displayed) Labs Reviewed  BASIC METABOLIC PANEL WITH GFR - Abnormal; Notable for the following components:      Result Value   CO2 18 (*)    Glucose, Bld 107 (*)    All other components within normal limits  CBC WITH DIFFERENTIAL/PLATELET - Abnormal; Notable for the following components:   Hemoglobin 12.1 (*)    HCT 38.9 (*)    RDW 17.1 (*)    Eosinophils Absolute 0.6 (*)    Abs Immature Granulocytes 0.09 (*)    All other components within normal limits  CK - Abnormal; Notable for the following components:   Total CK 48 (*)    All other components within normal limits   URINALYSIS, ROUTINE W REFLEX MICROSCOPIC  TROPONIN I (HIGH SENSITIVITY)  TROPONIN I (HIGH SENSITIVITY)    EKG: EKG Interpretation Date/Time:  Sunday August 30 2023 16:56:57 EDT Ventricular Rate:  64 PR Interval:  160 QRS Duration:  100 QT Interval:  426 QTC Calculation: 440 R Axis:   40  Text Interpretation: Sinus rhythm Normal intervals No significant changes when compared with prior EKG from 05/21/2023 Confirmed by Gennaro Bouchard ([id number]) on 08/30/2023 5:22:02 PM  Radiology: CT Head Wo Contrast Result Date: 08/30/2023 CLINICAL DATA:  Altered mental status and dizziness.  Hypertension. EXAM: CT HEAD WITHOUT CONTRAST TECHNIQUE: Contiguous axial images were obtained from the base of the skull through the vertex without intravenous contrast. RADIATION DOSE REDUCTION: This exam was performed according to the departmental dose-optimization program which includes automated exposure control, adjustment of the mA and/or kV according to patient size and/or use of iterative reconstruction technique. COMPARISON:  05/21/2023 FINDINGS: Brain: No evidence of intracranial hemorrhage, acute infarction, hydrocephalus, extra-axial collection, or mass lesion/mass effect. 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 Vascular:  No hyperdense vessel or other acute findings. Skull: No evidence of fracture or other significant bone abnormality. Sinuses/Orbits:  No acute findings.  Prior left mastoidectomy. Other: None. IMPRESSION: No acute intracranial abnormality. 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 Electronically Signed   By: Norleen DELENA Kil M.D.   On: 08/30/2023 17:59   DG Chest 1 View Result Date: 08/30/2023 CLINICAL DATA:  Altered mental status EXAM: CHEST  1 VIEW COMPARISON:  05/26/2019 FINDINGS: Heart and mediastinal contours are within normal limits. No focal opacities or effusions. No acute bony abnormality. IMPRESSION: No active disease. Electronically Signed    By: Franky Crease M.D.   On: 08/30/2023 17:28     Procedures   Medications Ordered in the ED  sodium chloride  0.9 % bolus 500 mL (0 mLs Intravenous Stopped 08/30/23 2121)                                    Medical Decision Making Patient here for likely dehydration and heat exhaustion.  He was altered at home and hypotensive on EMS arrival.  Was given a liter of IV fluids and he is back to baseline feeling well with no complaints and stable vitals.  I will give another bolus of fluids.  Lab workup reviewed and unremarkable.  Patient is asymptomatic and feeling much improved.  All results and plan discussed with patient and  family at bedside.  They feel comfortable with the plan to be discharged home.  I advised close follow-up with primary care doctor and he does have a follow-up early next month.  Advised return to the ER for new or worsening symptoms.  Patient feels good with the plan to be discharged.  Problems Addressed: Dehydration: acute illness or injury Heat exhaustion, initial encounter: acute illness or injury  Amount and/or Complexity of Data Reviewed External Data Reviewed: notes.    Details: Outpatient records reviewed and patient follows with vascular surgery for an abdominal aortic aneurysm Labs: ordered. Decision-making details documented in ED Course.    Details: Ordered and reviewed by me and unremarkable.  Troponins are negative x 2 Radiology: ordered and independent interpretation performed. Decision-making details documented in ED Course.    Details: Ordered and interpreted by me independently of radiology Chest x-ray: Shows no acute abnormality in the chest CT head shows evidence of old stroke but no acute abnormality ECG/medicine tests: ordered and independent interpretation performed. Decision-making details documented in ED Course.    Details: Ordered and interpreted by me independently of cardiology and shows sinus rhythm, no STEMI and no acute changes when  compared to prior EKG  Risk OTC drugs. Prescription drug management. Drug therapy requiring intensive monitoring for toxicity.     Final diagnoses:  Heat exhaustion, initial encounter  Dehydration    ED Discharge Orders     None          Gennaro Duwaine CROME, DO 08/30/23 2320

## 2023-09-01 ENCOUNTER — Telehealth: Payer: Self-pay

## 2023-09-01 NOTE — Patient Outreach (Signed)
 No Telephone outreach to patient. Dr. Rosemarie obtained mRS successfully on 08/12/23. MRS= 2  Shereen Gin [facility] Health Care Management Assistant  Direct Dial: [phone]  Fax: [phone] Website: [email or web address]

## 2023-09-03 ENCOUNTER — Ambulatory Visit (HOSPITAL_COMMUNITY): Payer: PRIVATE HEALTH INSURANCE

## 2023-09-21 ENCOUNTER — Ambulatory Visit (HOSPITAL_COMMUNITY)
Admission: RE | Admit: 2023-09-21 | Discharge: 2023-09-21 | Disposition: A | Discharge status: Home / Self Care | Payer: PRIVATE HEALTH INSURANCE | Source: Ambulatory Visit | Attending: Neurology | Admitting: Neurology

## 2023-09-21 DIAGNOSIS — I6522 Occlusion and stenosis of left carotid artery: Secondary | ICD-10-CM | POA: Diagnosis not present

## 2023-09-21 DIAGNOSIS — I63512 Cerebral infarction due to unspecified occlusion or stenosis of left middle cerebral artery: Secondary | ICD-10-CM | POA: Diagnosis present

## 2023-09-24 ENCOUNTER — Ambulatory Visit: Payer: Self-pay | Admitting: Neurology

## 2023-11-09 NOTE — Progress Notes (Unsigned)
 Patient name: Dale Perez MRN: [id number] DOB: August 12, 1955 Sex: male  REASON FOR CONSULT: 68-month follow-up AAA  HPI: Dale Perez is a 68 y.o. male, with history of tobacco abuse and AAA who presents for for follow-up of his AAA.  Last seen on 07/07/2023 with CT without contrast showing a 5.6 cm AAA.  We recommended waiting 6 months given the need to stay on Brilinta  following his stroke   Previously admitted on 05/21/23 with left ICA occlusion and underwent thrombectomy with IR including a left ICA stent and mechanical thrombectomy of the left M2.    Past Medical History:  Diagnosis Date   AAA (abdominal aortic aneurysm) 04/2019   4.4cm   Essential hypertension 05/23/2023   Stroke (HCC) 05/2023    Past Surgical History:  Procedure Laterality Date   APPENDECTOMY     COLONOSCOPY  11/2008   Rourk; sigmoid diverticula, otherwise normal exam. Repeat in 2020.    COLONOSCOPY N/A 09/09/2019   Procedure: COLONOSCOPY;  Surgeon: Shaaron Lamar HERO, MD;  Location: [facility]ITE;  Service: Endoscopy;  Laterality: N/A;  9:45am   I & D EXTREMITY Left 08/10/2015   Procedure: IRRIGATION AND DEBRIDEMENT EXTREMITY;  Surgeon: Alm Hummer, MD;  Location: [facility] OR;  Service: Orthopedics;  Laterality: Left;   IR CT HEAD LTD  05/21/2023   IR CT HEAD LTD  05/21/2023   IR INTRAVSC STENT CERV CAROTID W/O EMB-PROT MOD SED INC ANGIO  05/21/2023   IR PERCUTANEOUS ART THROMBECTOMY/INFUSION INTRACRANIAL INC DIAG ANGIO  05/21/2023   IR RADIOLOGIST EVAL & MGMT  06/19/2023   KNEE CARTILAGE SURGERY Right 02/2019   KNEE SURGERY     RADIOLOGY WITH ANESTHESIA N/A 05/21/2023   Procedure: RADIOLOGY WITH ANESTHESIA;  Surgeon: Radiologist, Medication, MD;  Location: [facility] OR;  Service: Radiology;  Laterality: N/A;   SHOULDER ARTHROSCOPY Left 08/2018   SHOULDER SURGERY     TENDON REPAIR N/A 08/10/2015   Procedure: TENDON REPAIR WITH WOUND CLOSURE;  Surgeon: Alm Hummer, MD;  Location: [facility] OR;  Service: Orthopedics;   Laterality: N/A;   TONSILLECTOMY      Family History  Problem Relation Age of Onset   Heart attack Father    Colon cancer Neg Hx    Colon polyps Neg Hx     SOCIAL HISTORY: Social History   Socioeconomic History   Marital status: Married    Spouse name: Not on file   Number of children: Not on file   Years of education: Not on file   Highest education level: Not on file  Occupational History   Not on file  Tobacco Use   Smoking status: Former    Current packs/day: 0.00    Average packs/day: 1 pack/day for 40.0 years (40.0 ttl pk-yrs)    Types: Cigarettes    Quit date: 05/21/2023    Years since quitting: 0.4   Smokeless tobacco: Never  Vaping Use   Vaping status: Never Used  Substance and Sexual Activity   Alcohol use: Not Currently    Comment: rarely   Drug use: Never   Sexual activity: Yes  Other Topics Concern   Not on file  Social History Narrative   ** Merged History Encounter **       Social Drivers of Health   Financial Resource Strain: Not on file  Food Insecurity: No Food Insecurity (05/28/2023)   Hunger Vital Sign    Worried About Running Out of Food in the Last Year: Never true  Ran Out of Food in the Last Year: Never true  Transportation Needs: No Transportation Needs (05/28/2023)   PRAPARE - Administrator, Civil Service (Medical): No    Lack of Transportation (Non-Medical): No  Physical Activity: Not on file  Stress: Not on file  Social Connections: Not on file  Intimate Partner Violence: Patient Unable To Answer (05/28/2023)   Humiliation, Afraid, Rape, and Kick questionnaire    Fear of Current or Ex-Partner: Patient unable to answer    Emotionally Abused: Patient unable to answer    Physically Abused: Patient unable to answer    Sexually Abused: Patient unable to answer    Allergies  Allergen Reactions   Quinolones Other (See Comments)    Patient has an AAA (Abdominal Aortic Aneurysm)   Aspirin  Hives, Rash and Other (See  Comments)    Reaction happened in childhood, but has been able to take 81 mg since then, however   Penicillins Other (See Comments)    Reaction unconfirmed- was stopped in the hospital after the initial reaction however (per family).    Current Outpatient Medications  Medication Sig Dispense Refill   acetaminophen  (TYLENOL ) 500 MG tablet Take 1,000 mg by mouth every 6 (six) hours as needed for mild pain (pain score 1-3).     aspirin  81 MG chewable tablet Chew 1 tablet (81 mg total) by mouth daily. 30 tablet 0   atorvastatin  (LIPITOR) 40 MG tablet Take 1 tablet (40 mg total) by mouth daily. 30 tablet 1   olmesartan (BENICAR) 40 MG tablet Take 40 mg by mouth daily.     ticagrelor  (BRILINTA ) 90 MG TABS tablet Take 1 tablet (90 mg total) by mouth 2 (two) times daily. 180 tablet 0   No current facility-administered medications for this visit.   Facility-Administered Medications Ordered in Other Visits  Medication Dose Route Frequency Provider Last Rate Last Admin   acetaminophen  (TYLENOL ) tablet 650 mg  650 mg Oral Q6H Sebastian Moles, MD   650 mg at 04/19/19 1207   HYDROmorphone  (DILAUDID ) injection 1 mg  1 mg Intravenous Q2H PRN Sebastian Moles, MD   1 mg at 04/15/19 2255   metoprolol  tartrate (LOPRESSOR ) injection 5 mg  5 mg Intravenous Q6H PRN Signe Mitzie LABOR, MD   5 mg at 04/12/19 0146    REVIEW OF SYSTEMS:  [X]  denotes positive finding, [ ]  denotes negative finding Cardiac  Comments:  Chest pain or chest pressure:    Shortness of breath upon exertion:    Short of breath when lying flat:    Irregular heart rhythm:        Vascular    Pain in calf, thigh, or hip brought on by ambulation:    Pain in feet at night that wakes you up from your sleep:     Blood clot in your veins:    Leg swelling:         Pulmonary    Oxygen at home:    Productive cough:     Wheezing:         Neurologic    Sudden weakness in arms or legs:     Sudden numbness in arms or legs:     Sudden onset  of difficulty speaking or slurred speech:    Temporary loss of vision in one eye:     Problems with dizziness:         Gastrointestinal    Blood in stool:     Vomited blood:  Genitourinary    Burning when urinating:     Blood in urine:        Psychiatric    Major depression:         Hematologic    Bleeding problems:    Problems with blood clotting too easily:        Skin    Rashes or ulcers:        Constitutional    Fever or chills:      PHYSICAL EXAM: There were no vitals filed for this visit.    GENERAL: The patient is a well-nourished male, in no acute distress. The vital signs are documented above. CARDIAC: There is a regular rate and rhythm.  VASCULAR:  Palpable femoral pulses bilateral groins. Palpable DP/PT pulses bilateral lower extremities. PULMONARY: No respiratory distress. ABDOMEN: Soft and non-tender.  No pain with palpation of aneurysm. MUSCULOSKELETAL: There are no major deformities or cyanosis. NEUROLOGIC: No focal weakness or paresthesias are detected. PSYCHIATRIC: The patient has a normal affect.  DATA:   CT abdomen pelvis 06/12/2023 without contrast on my review shows a 5.2 cm AAA  AAA duplex 04/21/23 shows his aneurysm has increased 5.04 cm ---> 5.41 cm over the past 6 months  Assessment/Plan:  68 y.o. male, with history of tobacco abuse and AAA who presents for for follow-up of his AAA.  Last seen on 07/07/2023 with CT without contrast showing a 5.6 cm AAA.  We recommended waiting 6 months given the need to stay on Brilinta  following his stroke   Previously admitted on 05/21/23 with left ICA occlusion and underwent thrombectomy with IR including a left ICA stent and mechanical thrombectomy of the left M2.     Lonni DOROTHA Gaskins, MD [facility] Office: [phone]

## 2023-11-10 ENCOUNTER — Other Ambulatory Visit: Payer: Self-pay

## 2023-11-10 ENCOUNTER — Ambulatory Visit (INDEPENDENT_AMBULATORY_CARE_PROVIDER_SITE_OTHER): Admitting: Vascular Surgery

## 2023-11-10 ENCOUNTER — Ambulatory Visit (HOSPITAL_COMMUNITY)
Admission: RE | Admit: 2023-11-10 | Discharge: 2023-11-10 | Disposition: A | Discharge status: Home / Self Care | Source: Ambulatory Visit | Attending: Vascular Surgery | Admitting: Vascular Surgery

## 2023-11-10 ENCOUNTER — Encounter: Payer: Self-pay | Admitting: Vascular Surgery

## 2023-11-10 VITALS — BP 136/78 | HR 69 | Temp 98.6°F | Resp 18 | Ht 73.0 in | Wt 154.3 lb

## 2023-11-10 DIAGNOSIS — I7143 Infrarenal abdominal aortic aneurysm, without rupture: Secondary | ICD-10-CM

## 2023-11-26 ENCOUNTER — Other Ambulatory Visit: Payer: Self-pay | Admitting: Neurology

## 2023-11-26 NOTE — Telephone Encounter (Signed)
 Last seen on 08/12/23 per note  Continue aspirin  81 mg and Brilinta  90 mg twice daily for total of 6 months following his stroke and then stop Brilinta  and stay on aspirin  alone for secondary stroke prevention    Follow up scheduled on 06/30/24

## 2023-12-07 NOTE — Pre-Procedure Instructions (Signed)
 Surgical Instructions   Your procedure is scheduled on December 14, 2023. Report to [facility] Main Entrance A at 7:30 A.M., then check in with the Admitting office. Any questions or running late day of surgery: call [phone]  Questions prior to your surgery date: call [phone], Monday-Friday, 8am-4pm. If you experience any cold or flu symptoms such as cough, fever, chills, shortness of breath, etc. between now and your scheduled surgery, please notify us  at the above number.     Remember:  Do not eat or drink after midnight the night before your surgery. This includes no gum or hard candy.    Take these medicines the morning of surgery with A SIP OF WATER  : Amlodipine (Norvasc) Aspirin  Atorvastatin  (Lipitor)   May take these medicines IF NEEDED: Acetaminophen  (Tylenol ) Colchicine  YOU WILL NEED TO HOLD YOUR  BRILINTA  FOR 5 DAYS BEFORE YOUR PROCEDURE. TAKE YOUR LAST DOSE ON 11/4.    One week prior to surgery, STOP taking any Aleve, Naproxen, Ibuprofen , Motrin , Advil , Goody's, BC's, all herbal medications, fish oil, and non-prescription vitamins. This includes your Indomethacin (Indocin).                     Do NOT Smoke (Tobacco/Vaping) for 24 hours prior to your procedure.  If you use a CPAP at night, you may bring your mask/headgear for your overnight stay.   You will be asked to remove any contacts, glasses, piercing's, hearing aid's, dentures/partials prior to surgery. Please bring cases for these items if needed.    Patients discharged the day of surgery will not be allowed to drive home, and someone needs to stay with them for 24 hours.  SURGICAL WAITING ROOM VISITATION Patients may have no more than 2 support people in the waiting area - these visitors may rotate.   Pre-op nurse will coordinate an appropriate time for 1 ADULT support person, who may not rotate, to accompany patient in pre-op.  Children under the age of 48 must have an adult with them who is  not the patient and must remain in the main waiting area with an adult.  If the patient needs to stay at the hospital during part of their recovery, the visitor guidelines for inpatient rooms apply.  Please refer to the [facility] website for the visitor guidelines for any additional information.   If you received a COVID test during your pre-op visit  it is requested that you wear a mask when out in public, stay away from anyone that may not be feeling well and notify your surgeon if you develop symptoms. If you have been in contact with anyone that has tested positive in the last 10 days please notify you surgeon.      Pre-operative CHG Bathing Instructions   You can play a key role in reducing the risk of infection after surgery. Your skin needs to be as free of germs as possible. You can reduce the number of germs on your skin by washing with CHG (chlorhexidine  gluconate) soap before surgery. CHG is an antiseptic soap that kills germs and continues to kill germs even after washing.   DO NOT use if you have an allergy to chlorhexidine /CHG or antibacterial soaps. If your skin becomes reddened or irritated, stop using the CHG and notify one of our RNs at [phone].              TAKE A SHOWER THE NIGHT BEFORE SURGERY   Please keep in mind the following:  DO NOT shave, including legs and underarms, 48 hours prior to surgery.   You may shave your face before/day of surgery.  Place clean sheets on your bed the night before surgery Use a clean washcloth (not used since being washed) for shower. DO NOT sleep with pet's night before surgery.  CHG Shower Instructions:  Wash your face and private area with normal soap. If you choose to wash your hair, wash first with your normal shampoo.  After you use shampoo/soap, rinse your hair and body thoroughly to remove shampoo/soap residue.  Turn the water  OFF and apply half the bottle of CHG soap to a CLEAN washcloth.  Apply CHG soap ONLY FROM  YOUR NECK DOWN TO YOUR TOES (washing for 3-5 minutes)  DO NOT use CHG soap on face, private areas, open wounds, or sores.  Pay special attention to the area where your surgery is being performed.  If you are having back surgery, having someone wash your back for you may be helpful. Wait 2 minutes after CHG soap is applied, then you may rinse off the CHG soap.  Pat dry with a clean towel  Put on clean pajamas    Additional instructions for the day of surgery: If you choose, you may shower the morning of surgery with an antibacterial soap.  DO NOT APPLY any lotions, deodorants, cologne, or perfumes.   Do not wear jewelry or makeup Do not wear nail polish, gel polish, artificial nails, or any other type of covering on natural nails (fingers and toes) Do not bring valuables to the hospital. [facility] is not responsible for valuables/personal belongings. Put on clean/comfortable clothes.  Please brush your teeth.  Ask your nurse before applying any prescription medications to the skin.

## 2023-12-08 ENCOUNTER — Other Ambulatory Visit: Payer: Self-pay

## 2023-12-08 ENCOUNTER — Encounter (HOSPITAL_COMMUNITY)
Admission: RE | Admit: 2023-12-08 | Discharge: 2023-12-08 | Disposition: A | Discharge status: Home / Self Care | Payer: PRIVATE HEALTH INSURANCE | Source: Ambulatory Visit | Attending: Vascular Surgery | Admitting: Vascular Surgery

## 2023-12-08 ENCOUNTER — Encounter (HOSPITAL_COMMUNITY): Payer: Self-pay

## 2023-12-08 VITALS — BP 122/68 | HR 56 | Temp 97.9°F | Resp 18 | Ht 73.0 in | Wt 156.8 lb

## 2023-12-08 DIAGNOSIS — Z01812 Encounter for preprocedural laboratory examination: Secondary | ICD-10-CM | POA: Insufficient documentation

## 2023-12-08 DIAGNOSIS — I714 Abdominal aortic aneurysm, without rupture, unspecified: Secondary | ICD-10-CM | POA: Insufficient documentation

## 2023-12-08 DIAGNOSIS — I7143 Infrarenal abdominal aortic aneurysm, without rupture: Secondary | ICD-10-CM | POA: Insufficient documentation

## 2023-12-08 DIAGNOSIS — I69831 Monoplegia of upper limb following other cerebrovascular disease affecting right dominant side: Secondary | ICD-10-CM | POA: Diagnosis not present

## 2023-12-08 DIAGNOSIS — Z7982 Long term (current) use of aspirin: Secondary | ICD-10-CM | POA: Insufficient documentation

## 2023-12-08 DIAGNOSIS — Z951 Presence of aortocoronary bypass graft: Secondary | ICD-10-CM | POA: Insufficient documentation

## 2023-12-08 DIAGNOSIS — I6521 Occlusion and stenosis of right carotid artery: Secondary | ICD-10-CM | POA: Insufficient documentation

## 2023-12-08 DIAGNOSIS — Z7902 Long term (current) use of antithrombotics/antiplatelets: Secondary | ICD-10-CM | POA: Insufficient documentation

## 2023-12-08 DIAGNOSIS — I10 Essential (primary) hypertension: Secondary | ICD-10-CM | POA: Insufficient documentation

## 2023-12-08 DIAGNOSIS — Z01818 Encounter for other preprocedural examination: Secondary | ICD-10-CM

## 2023-12-08 HISTORY — DX: Unspecified osteoarthritis, unspecified site: M19.90

## 2023-12-08 LAB — COMPREHENSIVE METABOLIC PANEL WITH GFR
ALT: 26 U/L (ref 0–44)
AST: 24 U/L (ref 15–41)
Albumin: 3.9 g/dL (ref 3.5–5.0)
Alkaline Phosphatase: 110 U/L (ref 38–126)
Anion gap: 9 (ref 5–15)
BUN: 16 mg/dL (ref 8–23)
CO2: 29 mmol/L (ref 22–32)
Calcium: 9.3 mg/dL (ref 8.9–10.3)
Chloride: 104 mmol/L (ref 98–111)
Creatinine, Ser: 0.86 mg/dL (ref 0.61–1.24)
GFR, Estimated: >60 mL/min (ref >60)
Glucose, Bld: 68 mg/dL — ABNORMAL LOW (ref 70–99)
Potassium: 3.7 mmol/L (ref 3.5–5.1)
Sodium: 142 mmol/L (ref 135–145)
Total Bilirubin: 0.3 mg/dL (ref 0.0–1.2)
Total Protein: 7.3 g/dL (ref 6.5–8.1)

## 2023-12-08 LAB — URINALYSIS, ROUTINE W REFLEX MICROSCOPIC
Bilirubin Urine: NEGATIVE
Glucose, UA: NEGATIVE mg/dL
Hgb urine dipstick: NEGATIVE
Ketones, ur: NEGATIVE mg/dL
Leukocytes,Ua: NEGATIVE
Nitrite: NEGATIVE
Protein, ur: NEGATIVE mg/dL
Specific Gravity, Urine: 1.03 (ref 1.005–1.030)
pH: 6 (ref 5.0–8.0)

## 2023-12-08 LAB — CBC
HCT: 43.6 % (ref 39.0–52.0)
Hemoglobin: 13.9 g/dL (ref 13.0–17.0)
MCH: 28.8 pg (ref 26.0–34.0)
MCHC: 31.9 g/dL (ref 30.0–36.0)
MCV: 90.3 fL (ref 80.0–100.0)
Platelets: 309 K/uL (ref 150–400)
RBC: 4.83 MIL/uL (ref 4.22–5.81)
RDW: 14.2 % (ref 11.5–15.5)
WBC: 8.9 K/uL (ref 4.0–10.5)
nRBC: 0 % (ref 0.0–0.2)

## 2023-12-08 LAB — SURGICAL PCR SCREEN
MRSA, PCR: NEGATIVE
Staphylococcus aureus: NEGATIVE

## 2023-12-08 LAB — PROTIME-INR
INR: 1 (ref 0.8–1.2)
Prothrombin Time: 14 s (ref 11.4–15.2)

## 2023-12-08 LAB — APTT: aPTT: 34 s (ref 24–36)

## 2023-12-08 NOTE — Pre-Procedure Instructions (Signed)
 Surgical Instructions     Your procedure is scheduled on December 14, 2023. Report to [facility] Main Entrance A at 7:30 A.M., then check in with the Admitting office. Any questions or running late day of surgery: call [phone]   Questions prior to your surgery date: call [phone], Monday-Friday, 8am-4pm. If you experience any cold or flu symptoms such as cough, fever, chills, shortness of breath, etc. between now and your scheduled surgery, please notify us  at the above number.            Remember:       Do not eat or drink after midnight the night before your surgery. This includes no gum or hard candy.          Take these medicines the morning of surgery with A SIP OF WATER  : Aspirin  Atorvastatin  (Lipitor)     May take these medicines IF NEEDED: Acetaminophen  (Tylenol ) Colchicine   YOU WILL NEED TO HOLD YOUR  BRILINTA  FOR 5 DAYS BEFORE YOUR PROCEDURE. TAKE YOUR LAST DOSE ON 11/4.      One week prior to surgery, STOP taking any Aleve, Naproxen, Ibuprofen , Motrin , Advil , Goody's, BC's, all herbal medications, fish oil, and non-prescription vitamins. This includes your Indomethacin (Indocin).                     Do NOT Smoke (Tobacco/Vaping) for 24 hours prior to your procedure.   If you use a CPAP at night, you may bring your mask/headgear for your overnight stay.   You will be asked to remove any contacts, glasses, piercing's, hearing aid's, dentures/partials prior to surgery. Please bring cases for these items if needed.    Patients discharged the day of surgery will not be allowed to drive home, and someone needs to stay with them for 24 hours.   SURGICAL WAITING ROOM VISITATION Patients may have no more than 2 support people in the waiting area - these visitors may rotate.   Pre-op nurse will coordinate an appropriate time for 1 ADULT support person, who may not rotate, to accompany patient in pre-op.  Children under the age of 52 must have an adult with them  who is not the patient and must remain in the main waiting area with an adult.   If the patient needs to stay at the hospital during part of their recovery, the visitor guidelines for inpatient rooms apply.   Please refer to the [facility] website for the visitor guidelines for any additional information.     If you received a COVID test during your pre-op visit  it is requested that you wear a mask when out in public, stay away from anyone that may not be feeling well and notify your surgeon if you develop symptoms. If you have been in contact with anyone that has tested positive in the last 10 days please notify you surgeon.         Pre-operative CHG Bathing Instructions    You can play a key role in reducing the risk of infection after surgery. Your skin needs to be as free of germs as possible. You can reduce the number of germs on your skin by washing with CHG (chlorhexidine  gluconate) soap before surgery. CHG is an antiseptic soap that kills germs and continues to kill germs even after washing.    DO NOT use if you have an allergy to chlorhexidine /CHG or antibacterial soaps. If your skin becomes reddened or irritated, stop using the CHG and  notify one of our RNs at [phone].               TAKE A SHOWER THE NIGHT BEFORE SURGERY    Please keep in mind the following:  DO NOT shave, including legs and underarms, 48 hours prior to surgery.   You may shave your face before/day of surgery.  Place clean sheets on your bed the night before surgery Use a clean washcloth (not used since being washed) for shower. DO NOT sleep with pet's night before surgery.   CHG Shower Instructions:  Wash your face and private area with normal soap. If you choose to wash your hair, wash first with your normal shampoo.  After you use shampoo/soap, rinse your hair and body thoroughly to remove shampoo/soap residue.  Turn the water  OFF and apply half the bottle of CHG soap to a CLEAN washcloth.  Apply  CHG soap ONLY FROM YOUR NECK DOWN TO YOUR TOES (washing for 3-5 minutes)  DO NOT use CHG soap on face, private areas, open wounds, or sores.  Pay special attention to the area where your surgery is being performed.  If you are having back surgery, having someone wash your back for you may be helpful. Wait 2 minutes after CHG soap is applied, then you may rinse off the CHG soap.  Pat dry with a clean towel  Put on clean pajamas     Additional instructions for the day of surgery: If you choose, you may shower the morning of surgery with an antibacterial soap.  DO NOT APPLY any lotions, deodorants, cologne, or perfumes.   Do not wear jewelry or makeup Do not wear nail polish, gel polish, artificial nails, or any other type of covering on natural nails (fingers and toes) Do not bring valuables to the hospital. [facility] is not responsible for valuables/personal belongings. Put on clean/comfortable clothes.  Please brush your teeth.  Ask your nurse before applying any prescription medications to the skin.

## 2023-12-08 NOTE — Progress Notes (Signed)
 PCP - Marvine Rush, MD Cardiologist - denies Neurologist- Rosemarie Rothman, MD  PPM/ICD - denies Device Orders - n/a Rep Notified - n/a  Chest x-ray - 08/30/2023 EKG - 08/30/2023 Stress Test - denies ECHO - 05/22/2023 Cardiac Cath - denies  Sleep Study - denies CPAP - n/a  Fasting Blood Sugar - no DM Checks Blood Sugar _____ times a day  Last dose of GLP1 agonist-  n/a GLP1 instructions: n/a  Blood Thinner Instructions: stop Brillinta 5 days prior to surgery; last dose - 12/08/2023 Aspirin  Instructions: no hold  ERAS Protcol -no, NPO PRE-SURGERY Ensure or G2- n/a  COVID TEST- n/a   Anesthesia review: yes, hx of stroke in 05/2023  Patient denies shortness of breath, fever, cough and chest pain at PAT appointment   All instructions explained to the patient, with a verbal understanding of the material. Patient agrees to go over the instructions while at home for a better understanding. Patient also instructed to self quarantine after being tested for COVID-19. The opportunity to ask questions was provided.

## 2023-12-09 NOTE — Anesthesia Preprocedure Evaluation (Addendum)
 Anesthesia Evaluation  Patient identified by MRN, date of birth, ID band Patient awake    Reviewed: Allergy & Precautions, NPO status , Patient's Chart, lab work & pertinent test results, reviewed documented beta blocker date and time   History of Anesthesia Complications Negative for: history of anesthetic complications  Airway Mallampati: I  TM Distance: >3 FB     Dental no notable dental hx.    Pulmonary neg COPD, Current Smoker and Patient abstained from smoking.   breath sounds clear to auscultation       Cardiovascular hypertension, (-) angina + Peripheral Vascular Disease  (-) CAD and (-) Past MI  Rhythm:Regular Rate:Normal  IMPRESSIONS     1. Left ventricular ejection fraction, by estimation, is 60 to 65%. The  left ventricle has normal function. The left ventricle has no regional  wall motion abnormalities. Left ventricular diastolic parameters were  normal.   2. Right ventricular systolic function is normal. The right ventricular  size is normal. Tricuspid regurgitation signal is inadequate for assessing  PA pressure.   3. The mitral valve is normal in structure. Trivial mitral valve  regurgitation.   4. The aortic valve is tricuspid. Aortic valve regurgitation is not  visualized.   5. The inferior vena cava is dilated in size with <50% respiratory  variability, suggesting right atrial pressure of 15 mmHg.      Neuro/Psych neg Seizures CVA, Residual Symptoms    GI/Hepatic ,,,(+) neg Cirrhosis        Endo/Other    Renal/GU Renal disease     Musculoskeletal  (+) Arthritis ,    Abdominal   Peds  Hematology   Anesthesia Other Findings   Reproductive/Obstetrics                              Anesthesia Physical Anesthesia Plan  ASA: 3  Anesthesia Plan: General   Post-op Pain Management:    Induction: Intravenous  PONV Risk Score and Plan: 2 and  Ondansetron   Airway Management Planned: Oral ETT  Additional Equipment: Arterial line  Intra-op Plan:   Post-operative Plan: Extubation in OR  Informed Consent: I have reviewed the patients History and Physical, chart, labs and discussed the procedure including the risks, benefits and alternatives for the proposed anesthesia with the patient or authorized representative who has indicated his/her understanding and acceptance.     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  Echo showed LVEF 60 to 65%, normal RV, no significant valvular abnormalities.  He was discharged on aspirin  and Brilinta .  Last follow-up with neurologist Dr. Rosemarie on 08/12/2023. He was noted to be doing very well with only mild residual right hand paresthesia and weakness. 6 month followup recommended.  Seen in f/u by vascular surgeon Dr. Gretta 11/10/23. Per note, Last seen on 07/07/2023 with CT without contrast showing a 5.6 cm AAA.  We recommended waiting 6 months given the need to stay on Brilinta  following his stroke with ICA stent. On follow-up today no abdominal or back pain.  States he saw IR and they told him he could stop his Brilinta ...I have recommended bilateral transfemoral access in the OR with aortobiiliac stent graft. He needs to hold his Brilinta  and okay to continue  aspirin .  Patient reports last dose Brilinta  12/08/2023.  Preop labs reviewed, unremarkable.  EKG 08/30/23: NSR. Rate 64.  Carotid duplex 09/21/23: Summary:  Right Carotid: Velocities in the right ICA are consistent with a 1-39% stenosis. Non-hemodynamically significant plaque <50% noted in the CCA. The ECA appears >50% stenosed.  Left Carotid: Left carotid stent appears patent without evidence of  stenosis. Non-hemodynamically significant plaque <50% noted in the CCA. The ECA appears <50% stenosed.  Vertebrals: Bilateral vertebral arteries demonstrate antegrade flow.  Subclavians: Normal flow hemodynamics were seen in bilateral subclavian arteries.   TTE 05/22/2023: 1. Left ventricular ejection fraction, by estimation, is 60 to 65%. The  left ventricle has normal function. The left ventricle has no regional  wall motion abnormalities. Left ventricular diastolic parameters were  normal.  2. Right ventricular systolic function is normal. The right ventricular  size is normal. Tricuspid regurgitation signal is inadequate for assessing  PA pressure.  3. The mitral valve is normal in structure. Trivial mitral valve  regurgitation.  4. The aortic valve is tricuspid. Aortic valve regurgitation is not  visualized.  5. The inferior vena cava is dilated in size with <50% respiratory  variability, suggesting right atrial pressure of 15 mmHg.   )         Anesthesia Quick Evaluation

## 2023-12-09 NOTE — Progress Notes (Signed)
 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  Echo showed LVEF 60 to 65%, normal RV, no significant valvular abnormalities.  He was discharged on aspirin  and Brilinta .  Last follow-up with neurologist Dr. Rosemarie on 08/12/2023. He was noted to be doing very well with only mild residual right hand paresthesia and weakness. 6 month followup recommended.  Seen in f/u by vascular surgeon Dr. Gretta 11/10/23. Per note, Last seen on 07/07/2023 with CT without contrast showing a 5.6 cm AAA.  We recommended waiting 6 months given the need to stay on Brilinta  following his stroke with ICA stent. On follow-up today no abdominal or back pain.  States he saw IR and they told him he could stop his Brilinta ...I have recommended bilateral transfemoral access in the OR with aortobiiliac stent graft. He needs to hold his Brilinta  and okay to continue aspirin .  Patient reports last dose Brilinta  12/08/2023.  Preop labs reviewed, unremarkable.  EKG 08/30/23: NSR. Rate 64.  Carotid duplex 09/21/23: Summary:  Right Carotid: Velocities in the right ICA are consistent with a 1-39% stenosis. Non-hemodynamically significant plaque <50% noted in the CCA. The ECA appears >50% stenosed.  Left Carotid: Left carotid stent appears patent without evidence of stenosis. Non-hemodynamically significant plaque <50% noted in the CCA. The ECA appears <50% stenosed.  Vertebrals:  Bilateral vertebral arteries demonstrate antegrade flow.  Subclavians: Normal flow hemodynamics were seen in bilateral subclavian arteries.   TTE 05/22/2023: 1. Left ventricular ejection fraction, by estimation, is 60 to 65%. The  left ventricle has normal function. The left ventricle has no regional  wall motion abnormalities. Left ventricular diastolic parameters were  normal.   2. Right ventricular  systolic function is normal. The right ventricular  size is normal. Tricuspid regurgitation signal is inadequate for assessing  PA pressure.   3. The mitral valve is normal in structure. Trivial mitral valve  regurgitation.   4. The aortic valve is tricuspid. Aortic valve regurgitation is not  visualized.   5. The inferior vena cava is dilated in size with <50% respiratory  variability, suggesting right atrial pressure of 15 mmHg.    Lynwood Geofm [facility]IG[facility]ERS [facility]/Anesthesiology Phone [phone] 12/09/2023 3:16 PM

## 2023-12-14 ENCOUNTER — Inpatient Hospital Stay (HOSPITAL_COMMUNITY)

## 2023-12-14 ENCOUNTER — Inpatient Hospital Stay (HOSPITAL_COMMUNITY): Payer: Self-pay | Admitting: Physician Assistant

## 2023-12-14 ENCOUNTER — Encounter (HOSPITAL_COMMUNITY): Payer: Self-pay | Admitting: Vascular Surgery

## 2023-12-14 ENCOUNTER — Inpatient Hospital Stay (HOSPITAL_COMMUNITY)
Admission: RE | Admit: 2023-12-14 | Discharge: 2023-12-15 | DRG: 269 | Disposition: A | Discharge status: Home / Self Care | Attending: Vascular Surgery | Admitting: Vascular Surgery

## 2023-12-14 ENCOUNTER — Encounter (HOSPITAL_COMMUNITY): Admission: RE | Disposition: A | Payer: Self-pay | Source: Home / Self Care | Attending: Vascular Surgery

## 2023-12-14 ENCOUNTER — Inpatient Hospital Stay (HOSPITAL_COMMUNITY): Payer: Self-pay | Admitting: Registered Nurse

## 2023-12-14 DIAGNOSIS — Z8249 Family history of ischemic heart disease and other diseases of the circulatory system: Secondary | ICD-10-CM

## 2023-12-14 DIAGNOSIS — I714 Abdominal aortic aneurysm, without rupture, unspecified: Principal | ICD-10-CM

## 2023-12-14 DIAGNOSIS — Z7982 Long term (current) use of aspirin: Secondary | ICD-10-CM | POA: Diagnosis not present

## 2023-12-14 DIAGNOSIS — Z87891 Personal history of nicotine dependence: Secondary | ICD-10-CM

## 2023-12-14 DIAGNOSIS — Z886 Allergy status to analgesic agent status: Secondary | ICD-10-CM | POA: Diagnosis not present

## 2023-12-14 DIAGNOSIS — F17210 Nicotine dependence, cigarettes, uncomplicated: Secondary | ICD-10-CM | POA: Diagnosis not present

## 2023-12-14 DIAGNOSIS — Z79899 Other long term (current) drug therapy: Secondary | ICD-10-CM

## 2023-12-14 DIAGNOSIS — Z881 Allergy status to other antibiotic agents status: Secondary | ICD-10-CM

## 2023-12-14 DIAGNOSIS — M1990 Unspecified osteoarthritis, unspecified site: Secondary | ICD-10-CM | POA: Diagnosis present

## 2023-12-14 DIAGNOSIS — I70201 Unspecified atherosclerosis of native arteries of extremities, right leg: Secondary | ICD-10-CM | POA: Diagnosis not present

## 2023-12-14 DIAGNOSIS — Z880 Allergy status to penicillin: Secondary | ICD-10-CM | POA: Diagnosis not present

## 2023-12-14 DIAGNOSIS — E785 Hyperlipidemia, unspecified: Secondary | ICD-10-CM | POA: Diagnosis present

## 2023-12-14 DIAGNOSIS — Z7902 Long term (current) use of antithrombotics/antiplatelets: Secondary | ICD-10-CM

## 2023-12-14 DIAGNOSIS — I10 Essential (primary) hypertension: Secondary | ICD-10-CM

## 2023-12-14 DIAGNOSIS — Z8673 Personal history of transient ischemic attack (TIA), and cerebral infarction without residual deficits: Secondary | ICD-10-CM

## 2023-12-14 DIAGNOSIS — I7143 Infrarenal abdominal aortic aneurysm, without rupture: Principal | ICD-10-CM | POA: Diagnosis present

## 2023-12-14 HISTORY — PX: ABDOMINAL AORTIC ENDOVASCULAR STENT GRAFT: SHX5707

## 2023-12-14 HISTORY — PX: ULTRASOUND GUIDANCE FOR VASCULAR ACCESS: SHX6516

## 2023-12-14 LAB — BASIC METABOLIC PANEL WITH GFR
Anion gap: 9 (ref 5–15)
BUN: 15 mg/dL (ref 8–23)
CO2: 23 mmol/L (ref 22–32)
Calcium: 8.3 mg/dL — ABNORMAL LOW (ref 8.9–10.3)
Chloride: 106 mmol/L (ref 98–111)
Creatinine, Ser: 0.85 mg/dL (ref 0.61–1.24)
GFR, Estimated: >60 mL/min (ref >60)
Glucose, Bld: 107 mg/dL — ABNORMAL HIGH (ref 70–99)
Potassium: 4.1 mmol/L (ref 3.5–5.1)
Sodium: 138 mmol/L (ref 135–145)

## 2023-12-14 LAB — CBC
HCT: 35.9 % — ABNORMAL LOW (ref 39.0–52.0)
Hemoglobin: 11.5 g/dL — ABNORMAL LOW (ref 13.0–17.0)
MCH: 28.8 pg (ref 26.0–34.0)
MCHC: 32 g/dL (ref 30.0–36.0)
MCV: 90 fL (ref 80.0–100.0)
Platelets: 234 K/uL (ref 150–400)
RBC: 3.99 MIL/uL — ABNORMAL LOW (ref 4.22–5.81)
RDW: 14.2 % (ref 11.5–15.5)
WBC: 10.2 K/uL (ref 4.0–10.5)
nRBC: 0 % (ref 0.0–0.2)

## 2023-12-14 LAB — PROTIME-INR
INR: 1.1 (ref 0.8–1.2)
Prothrombin Time: 15.3 s — ABNORMAL HIGH (ref 11.4–15.2)

## 2023-12-14 LAB — MAGNESIUM: Magnesium: 1.7 mg/dL (ref 1.7–2.4)

## 2023-12-14 LAB — PREPARE RBC (CROSSMATCH)

## 2023-12-14 LAB — APTT: aPTT: 40 s — ABNORMAL HIGH (ref 24–36)

## 2023-12-14 LAB — ABO/RH: ABO/RH(D): A POS

## 2023-12-14 SURGERY — INSERTION, ENDOVASCULAR STENT GRAFT, AORTA, ABDOMINAL
Anesthesia: General | Site: Groin

## 2023-12-14 MED ORDER — MIDAZOLAM HCL 2 MG/2ML IJ SOLN
INTRAMUSCULAR | Status: AC
  Filled 2023-12-14: qty 2

## 2023-12-14 MED ORDER — CHLORHEXIDINE GLUCONATE CLOTH 2 % EX PADS
6.0000 | MEDICATED_PAD | Freq: Once | CUTANEOUS | Status: AC
  Administered 2023-12-14: 6 via TOPICAL

## 2023-12-14 MED ORDER — CEFAZOLIN SODIUM-DEXTROSE 2-4 GM/100ML-% IV SOLN
2.0000 g | Freq: Three times a day (TID) | INTRAVENOUS | Status: AC
  Administered 2023-12-14 – 2023-12-15 (×2): 2 g via INTRAVENOUS
  Filled 2023-12-14 (×2): qty 100

## 2023-12-14 MED ORDER — HEPARIN 6000 UNIT IRRIGATION SOLUTION
Status: AC
  Filled 2023-12-14: qty 500

## 2023-12-14 MED ORDER — LACTATED RINGERS IV SOLN: INTRAVENOUS | Status: DC | PRN

## 2023-12-14 MED ORDER — AMLODIPINE BESYLATE 5 MG PO TABS
5.0000 mg | ORAL_TABLET | Freq: Every day | ORAL | Status: DC
  Administered 2023-12-14 – 2023-12-15 (×2): 5 mg via ORAL
  Filled 2023-12-14 (×2): qty 1

## 2023-12-14 MED ORDER — HEPARIN SODIUM (PORCINE) 1000 UNIT/ML IJ SOLN
INTRAMUSCULAR | Status: DC | PRN
  Administered 2023-12-14: 8000 [IU] via INTRAVENOUS
  Administered 2023-12-14: 2000 [IU] via INTRAVENOUS

## 2023-12-14 MED ORDER — VANCOMYCIN HCL IN DEXTROSE 1-5 GM/200ML-% IV SOLN
1000.0000 mg | INTRAVENOUS | Status: AC
  Administered 2023-12-14: 1000 mg via INTRAVENOUS
  Filled 2023-12-14: qty 200

## 2023-12-14 MED ORDER — POLYETHYLENE GLYCOL 3350 17 G PO PACK: 17.0000 g | PACK | Freq: Every day | ORAL | Status: DC | PRN

## 2023-12-14 MED ORDER — FENTANYL CITRATE (PF) 100 MCG/2ML IJ SOLN
25.0000 ug | INTRAMUSCULAR | Status: DC | PRN
  Administered 2023-12-14: 50 ug via INTRAVENOUS

## 2023-12-14 MED ORDER — CHLORHEXIDINE GLUCONATE 0.12 % MT SOLN
15.0000 mL | Freq: Once | OROMUCOSAL | Status: AC
  Administered 2023-12-14: 15 mL via OROMUCOSAL
  Filled 2023-12-14: qty 15

## 2023-12-14 MED ORDER — METHOCARBAMOL 1000 MG/10ML IJ SOLN
1000.0000 mg | Freq: Once | INTRAMUSCULAR | Status: AC
  Administered 2023-12-14: 1000 mg via INTRAVENOUS

## 2023-12-14 MED ORDER — FENTANYL CITRATE (PF) 250 MCG/5ML IJ SOLN
INTRAMUSCULAR | Status: DC | PRN
  Administered 2023-12-14: 100 ug via INTRAVENOUS

## 2023-12-14 MED ORDER — ACETAMINOPHEN 650 MG RE SUPP: 325.0000 mg | RECTAL | Status: DC | PRN

## 2023-12-14 MED ORDER — HEPARIN 6000 UNIT IRRIGATION SOLUTION
Status: AC
Start: 2023-12-14 — End: 2023-12-14
  Filled 2023-12-14: qty 500

## 2023-12-14 MED ORDER — ACETAMINOPHEN 325 MG PO TABS: 325.0000 mg | ORAL_TABLET | ORAL | Status: DC | PRN

## 2023-12-14 MED ORDER — FENTANYL CITRATE (PF) 250 MCG/5ML IJ SOLN
INTRAMUSCULAR | Status: AC
  Filled 2023-12-14: qty 5

## 2023-12-14 MED ORDER — PANTOPRAZOLE SODIUM 40 MG PO TBEC
40.0000 mg | DELAYED_RELEASE_TABLET | Freq: Every day | ORAL | Status: DC
  Administered 2023-12-14 – 2023-12-15 (×2): 40 mg via ORAL
  Filled 2023-12-14 (×2): qty 1

## 2023-12-14 MED ORDER — DEXAMETHASONE SOD PHOSPHATE PF 10 MG/ML IJ SOLN
INTRAMUSCULAR | Status: DC | PRN
  Administered 2023-12-14: 10 mg via INTRAVENOUS

## 2023-12-14 MED ORDER — PROPOFOL 10 MG/ML IV BOLUS
INTRAVENOUS | Status: AC
Start: 2023-12-14 — End: 2023-12-14
  Filled 2023-12-14: qty 20

## 2023-12-14 MED ORDER — POTASSIUM CHLORIDE CRYS ER 20 MEQ PO TBCR: 40.0000 meq | EXTENDED_RELEASE_TABLET | Freq: Every day | ORAL | Status: DC | PRN

## 2023-12-14 MED ORDER — MAGNESIUM CHLORIDE 64 MG PO TBEC
1.0000 | DELAYED_RELEASE_TABLET | Freq: Every day | ORAL | Status: DC
  Administered 2023-12-14: 64 mg via ORAL
  Filled 2023-12-14 (×2): qty 1

## 2023-12-14 MED ORDER — PROTAMINE SULFATE 10 MG/ML IV SOLN
INTRAVENOUS | Status: DC | PRN
  Administered 2023-12-14: 50 mg via INTRAVENOUS

## 2023-12-14 MED ORDER — ATORVASTATIN CALCIUM 40 MG PO TABS
40.0000 mg | ORAL_TABLET | Freq: Every day | ORAL | Status: DC
  Administered 2023-12-15: 40 mg via ORAL
  Filled 2023-12-14: qty 1

## 2023-12-14 MED ORDER — LACTATED RINGERS IV SOLN: INTRAVENOUS | Status: DC

## 2023-12-14 MED ORDER — PHENYLEPHRINE HCL-NACL 20-0.9 MG/250ML-% IV SOLN
INTRAVENOUS | Status: DC | PRN
Start: 2023-12-14 — End: 2023-12-14
  Administered 2023-12-14: 25 ug/min via INTRAVENOUS

## 2023-12-14 MED ORDER — IODIXANOL 320 MG/ML IV SOLN
INTRAVENOUS | Status: DC | PRN
  Administered 2023-12-14: 88 mL via INTRA_ARTERIAL

## 2023-12-14 MED ORDER — ORAL CARE MOUTH RINSE: 15.0000 mL | Freq: Once | OROMUCOSAL | Status: AC

## 2023-12-14 MED ORDER — EPHEDRINE SULFATE-NACL 50-0.9 MG/10ML-% IV SOSY
PREFILLED_SYRINGE | INTRAVENOUS | Status: DC | PRN
Start: 2023-12-14 — End: 2023-12-14
  Administered 2023-12-14: 5 mg via INTRAVENOUS

## 2023-12-14 MED ORDER — SODIUM CHLORIDE 0.9 % IV SOLN: INTRAVENOUS | Status: DC

## 2023-12-14 MED ORDER — ONDANSETRON HCL 4 MG/2ML IJ SOLN: 4.0000 mg | Freq: Once | INTRAMUSCULAR | Status: DC | PRN

## 2023-12-14 MED ORDER — FENTANYL CITRATE (PF) 100 MCG/2ML IJ SOLN
INTRAMUSCULAR | Status: AC
  Filled 2023-12-14: qty 2

## 2023-12-14 MED ORDER — PROPOFOL 10 MG/ML IV BOLUS
INTRAVENOUS | Status: DC | PRN
  Administered 2023-12-14: 120 mg via INTRAVENOUS

## 2023-12-14 MED ORDER — METOPROLOL TARTRATE 5 MG/5ML IV SOLN: 2.5000 mg | INTRAVENOUS | Status: DC | PRN

## 2023-12-14 MED ORDER — OXYCODONE HCL 5 MG PO TABS: 5.0000 mg | ORAL_TABLET | Freq: Once | ORAL | Status: DC | PRN

## 2023-12-14 MED ORDER — IRBESARTAN 150 MG PO TABS
150.0000 mg | ORAL_TABLET | Freq: Every day | ORAL | Status: DC
  Administered 2023-12-14 – 2023-12-15 (×2): 150 mg via ORAL
  Filled 2023-12-14 (×2): qty 1

## 2023-12-14 MED ORDER — HEPARIN 6000 UNIT IRRIGATION SOLUTION
Status: DC | PRN
  Administered 2023-12-14: 1

## 2023-12-14 MED ORDER — ONDANSETRON HCL 4 MG/2ML IJ SOLN
INTRAMUSCULAR | Status: DC | PRN
  Administered 2023-12-14: 4 mg via INTRAVENOUS

## 2023-12-14 MED ORDER — METHOCARBAMOL 1000 MG/10ML IJ SOLN
INTRAMUSCULAR | Status: AC
  Filled 2023-12-14: qty 10

## 2023-12-14 MED ORDER — DOCUSATE SODIUM 100 MG PO CAPS
100.0000 mg | ORAL_CAPSULE | Freq: Every day | ORAL | Status: DC
  Administered 2023-12-14 – 2023-12-15 (×2): 100 mg via ORAL
  Filled 2023-12-14 (×2): qty 1

## 2023-12-14 MED ORDER — OXYCODONE HCL 5 MG PO TABS
5.0000 mg | ORAL_TABLET | ORAL | Status: DC | PRN
  Administered 2023-12-14: 10 mg via ORAL
  Administered 2023-12-14: 5 mg via ORAL
  Filled 2023-12-14: qty 2
  Filled 2023-12-14: qty 1

## 2023-12-14 MED ORDER — ACETAMINOPHEN 10 MG/ML IV SOLN: 1000.0000 mg | Freq: Once | INTRAVENOUS | Status: DC | PRN

## 2023-12-14 MED ORDER — PHENOL 1.4 % MT LIQD: 1.0000 | OROMUCOSAL | Status: DC | PRN

## 2023-12-14 MED ORDER — ESMOLOL HCL 100 MG/10ML IV SOLN
INTRAVENOUS | Status: DC | PRN
  Administered 2023-12-14: 20 mg via INTRAVENOUS

## 2023-12-14 MED ORDER — OXYCODONE HCL 5 MG/5ML PO SOLN: 5.0000 mg | Freq: Once | ORAL | Status: DC | PRN

## 2023-12-14 MED ORDER — HYDRALAZINE HCL 20 MG/ML IJ SOLN: 5.0000 mg | INTRAMUSCULAR | Status: DC | PRN

## 2023-12-14 MED ORDER — PHENYLEPHRINE 80 MCG/ML (10ML) SYRINGE FOR IV PUSH (FOR BLOOD PRESSURE SUPPORT)
PREFILLED_SYRINGE | INTRAVENOUS | Status: DC | PRN
  Administered 2023-12-14: 160 ug via INTRAVENOUS

## 2023-12-14 MED ORDER — MORPHINE SULFATE (PF) 2 MG/ML IV SOLN: 2.0000 mg | INTRAVENOUS | Status: DC | PRN

## 2023-12-14 MED ORDER — MIDAZOLAM HCL (PF) 2 MG/2ML IJ SOLN
INTRAMUSCULAR | Status: DC | PRN
  Administered 2023-12-14: 2 mg via INTRAVENOUS

## 2023-12-14 MED ORDER — SUGAMMADEX SODIUM 200 MG/2ML IV SOLN
INTRAVENOUS | Status: DC | PRN
  Administered 2023-12-14: 300 mg via INTRAVENOUS

## 2023-12-14 MED ORDER — LIDOCAINE 2% (20 MG/ML) 5 ML SYRINGE
INTRAMUSCULAR | Status: DC | PRN
  Administered 2023-12-14: 100 mg via INTRAVENOUS

## 2023-12-14 MED ORDER — ROCURONIUM BROMIDE 10 MG/ML (PF) SYRINGE
PREFILLED_SYRINGE | INTRAVENOUS | Status: DC | PRN
  Administered 2023-12-14: 70 mg via INTRAVENOUS

## 2023-12-14 MED ORDER — ASPIRIN 81 MG PO CHEW
81.0000 mg | CHEWABLE_TABLET | Freq: Every day | ORAL | Status: DC
  Administered 2023-12-15: 81 mg via ORAL
  Filled 2023-12-14: qty 1

## 2023-12-14 MED ORDER — HEPARIN SODIUM (PORCINE) 5000 UNIT/ML IJ SOLN
5000.0000 [IU] | Freq: Two times a day (BID) | INTRAMUSCULAR | Status: DC
  Administered 2023-12-15: 5000 [IU] via SUBCUTANEOUS
  Filled 2023-12-14: qty 1

## 2023-12-14 SURGICAL SUPPLY — 34 items
BAG COUNTER SPONGE SURGICOUNT (BAG) ×2 IMPLANT
CANISTER SUCTION 3000ML PPV (SUCTIONS) ×2 IMPLANT
CATH BEACON 5.038 65CM KMP-01 (CATHETERS) ×2 IMPLANT
CATH OMNI FLUSH .035X70CM (CATHETERS) ×2 IMPLANT
DERMABOND ADVANCED .7 DNX12 (GAUZE/BANDAGES/DRESSINGS) ×2 IMPLANT
DEVICE CLOSURE PERCLS PRGLD 6F (Vascular Products) ×8 IMPLANT
ELECTRODE REM PT RTRN 9FT ADLT (ELECTROSURGICAL) ×4 IMPLANT
EXCLUDER TNK END 32X14.5X14 18 (Endovascular Graft) IMPLANT
GLOVE BIO SURGEON STRL SZ7 (GLOVE) IMPLANT
GLOVE BIO SURGEON STRL SZ7.5 (GLOVE) ×2 IMPLANT
GLOVE BIOGEL PI IND STRL 8 (GLOVE) ×2 IMPLANT
GOWN STRL REUS W/ TWL LRG LVL3 (GOWN DISPOSABLE) ×6 IMPLANT
GOWN STRL REUS W/ TWL XL LVL3 (GOWN DISPOSABLE) ×2 IMPLANT
GRAFT BALLN CATH 65CM (BALLOONS) ×2 IMPLANT
KIT BASIN OR (CUSTOM PROCEDURE TRAY) ×2 IMPLANT
KIT TURNOVER KIT B (KITS) ×2 IMPLANT
PACK ENDOVASCULAR (PACKS) ×2 IMPLANT
PAD ARMBOARD POSITIONER FOAM (MISCELLANEOUS) ×4 IMPLANT
SET MICROPUNCTURE 5F STIFF (MISCELLANEOUS) ×2 IMPLANT
SHEATH BRITE TIP 8FR 23CM (SHEATH) ×2 IMPLANT
SHEATH DRYSEAL FLEX 12FR 33CM (SHEATH) IMPLANT
SHEATH DRYSEAL FLEX 18FR 33CM (SHEATH) IMPLANT
SHEATH PINNACLE 8F 10CM (SHEATH) ×2 IMPLANT
SOLN 0.9% NACL POUR BTL 1000ML (IV SOLUTION) ×2 IMPLANT
STENT GRAFT CONTRALAT 16X20X9. (Endovascular Graft) IMPLANT
STENT GRAFT CONTRALAT 20X13.5 (Vascular Products) IMPLANT
STOPCOCK MORSE 400PSI 3WAY (MISCELLANEOUS) ×2 IMPLANT
SUT MNCRL AB 4-0 PS2 18 (SUTURE) ×2 IMPLANT
SYR 20ML LL LF (SYRINGE) ×2 IMPLANT
TOWEL GREEN STERILE (TOWEL DISPOSABLE) ×2 IMPLANT
TRAY FOLEY MTR SLVR 16FR STAT (SET/KITS/TRAYS/PACK) ×2 IMPLANT
TUBING INJECTOR 48 (MISCELLANEOUS) IMPLANT
WIRE AMPLATZ SS-J .035X180CM (WIRE) ×4 IMPLANT
WIRE BENTSON .035X145CM (WIRE) ×4 IMPLANT

## 2023-12-14 NOTE — Transfer of Care (Signed)
 Immediate Anesthesia Transfer of Care Note  Patient: Dale Perez  Procedure(s) Performed: INSERTION, ENDOVASCULAR STENT GRAFT, AORTA, ABDOMINAL (Abdomen) ULTRASOUND GUIDANCE, FOR VASCULAR ACCESS (Bilateral: Groin)  Patient Location: PACU  Anesthesia Type:General  Level of Consciousness: awake, alert , and oriented  Airway & Oxygen Therapy: Patient Spontanous Breathing  Post-op Assessment: Report given to RN, Post -op Vital signs reviewed and stable, and Patient moving all extremities X 4  Post vital signs: Reviewed and stable  Last Vitals:  Vitals Value Taken Time  BP 118/74 12/14/23 11:08  Temp    Pulse 61 12/14/23 11:10  Resp 11 12/14/23 11:10  SpO2 94 % 12/14/23 11:10  Vitals shown include unfiled device data.  Last Pain:  Vitals:   12/14/23 0758  TempSrc:   PainSc: 0-No pain         Complications: No notable events documented.

## 2023-12-14 NOTE — Anesthesia Procedure Notes (Signed)
 Arterial Line Insertion Start/End11/11/2023 9:00 AM, 12/14/2023 9:05 AM Performed by: Claudene Arlin LABOR, CRNA, CRNA  Patient location: Pre-op. Preanesthetic checklist: patient identified, IV checked, site marked, risks and benefits discussed, surgical consent, monitors and equipment checked, pre-op evaluation, timeout performed and anesthesia consent Lidocaine  1% used for infiltration Right, radial was placed Catheter size: 20 G Hand hygiene performed  and maximum sterile barriers used  Allen's test indicative of satisfactory collateral circulation Attempts: 1 Following insertion, dressing applied and Biopatch. Post procedure assessment: normal and unchanged  Patient tolerated the procedure well with no immediate complications.

## 2023-12-14 NOTE — H&P (Signed)
 History and Physical Interval Note:  12/14/2023 8:06 AM  Dale Perez  has presented today for surgery, with the diagnosis of AAA w/o rupture.  The various methods of treatment have been discussed with the patient and family. After consideration of risks, benefits and other options for treatment, the patient has consented to  Procedure(s): INSERTION, ENDOVASCULAR STENT GRAFT, AORTA, ABDOMINAL (N/A) as a surgical intervention.  The patient's history has been reviewed, patient examined, no change in status, stable for surgery.  I have reviewed the patient's chart and labs.  Questions were answered to the patient's satisfaction.    EVAR for repair of AAA  Dale Perez     Patient name: Dale Perez     MRN: [id number]        DOB: Aug 07, 1955            Sex: male   REASON FOR CONSULT: 50-month follow-up AAA   HPI: Dale Perez is a 68 y.o. male, with history of CVA, tobacco abuse and AAA who presents for for follow-up of his AAA.   Last seen on 07/07/2023 with CT without contrast showing a 5.6 cm AAA.  We recommended waiting 6 months given the need to stay on Brilinta  following his stroke with ICA stent.   On follow-up today no abdominal or back pain.  States he saw IR and they told him he could stop his Brilinta .   Previously admitted on 05/21/23 with left ICA occlusion and underwent thrombectomy with IR including a left ICA stent and mechanical thrombectomy of the left M2.         Past Medical History:  Diagnosis Date   AAA (abdominal aortic aneurysm) 04/2019    4.4cm   Essential hypertension 05/23/2023   Stroke (HCC) 05/2023               Past Surgical History:  Procedure Laterality Date   APPENDECTOMY       COLONOSCOPY   11/2008    Rourk; sigmoid diverticula, otherwise normal exam. Repeat in 2020.    COLONOSCOPY N/A 09/09/2019    Procedure: COLONOSCOPY;  Surgeon: Shaaron Lamar HERO, MD;  Location: [facility];  Service: Endoscopy;  Laterality: N/A;  9:45am    I & D EXTREMITY Left 08/10/2015    Procedure: IRRIGATION AND DEBRIDEMENT EXTREMITY;  Surgeon: Alm Hummer, MD;  Location: [facility] OR;  Service: Orthopedics;  Laterality: Left;   IR CT HEAD LTD   05/21/2023   IR CT HEAD LTD   05/21/2023   IR INTRAVSC STENT CERV CAROTID W/O EMB-PROT MOD SED INC ANGIO   05/21/2023   IR PERCUTANEOUS ART THROMBECTOMY/INFUSION INTRACRANIAL INC DIAG ANGIO   05/21/2023   IR RADIOLOGIST EVAL & MGMT   06/19/2023   KNEE CARTILAGE SURGERY Right 02/2019   KNEE SURGERY       RADIOLOGY WITH ANESTHESIA N/A 05/21/2023    Procedure: RADIOLOGY WITH ANESTHESIA;  Surgeon: Radiologist, Medication, MD;  Location: [facility] OR;  Service: Radiology;  Laterality: N/A;   SHOULDER ARTHROSCOPY Left 08/2018   SHOULDER SURGERY       TENDON REPAIR N/A 08/10/2015    Procedure: TENDON REPAIR WITH WOUND CLOSURE;  Surgeon: Alm Hummer, MD;  Location: [facility] OR;  Service: Orthopedics;  Laterality: N/A;   TONSILLECTOMY                   Family History  Problem Relation Age of Onset   Heart attack Father     Colon cancer Neg Hx  Colon polyps Neg Hx            SOCIAL HISTORY: Social History         Socioeconomic History   Marital status: Married      Spouse name: Not on file   Number of children: Not on file   Years of education: Not on file   Highest education level: Not on file  Occupational History   Not on file  Tobacco Use   Smoking status: Former      Current packs/day: 0.00      Average packs/day: 1 pack/day for 40.0 years (40.0 ttl pk-yrs)      Types: Cigarettes      Quit date: 05/21/2023      Years since quitting: 0.4   Smokeless tobacco: Never  Vaping Use   Vaping status: Never Used  Substance and Sexual Activity   Alcohol use: Not Currently      Comment: rarely   Drug use: Never   Sexual activity: Yes  Other Topics Concern   Not on file  Social History Narrative    ** Merged History Encounter **         Social Drivers of Health        Financial Resource  Strain: Not on file  Food Insecurity: No Food Insecurity (05/28/2023)    Hunger Vital Sign     Worried About Running Out of Food in the Last Year: Never true     Ran Out of Food in the Last Year: Never true  Transportation Needs: No Transportation Needs (05/28/2023)    PRAPARE - Therapist, Art (Medical): No     Lack of Transportation (Non-Medical): No  Physical Activity: Not on file  Stress: Not on file  Social Connections: Not on file  Intimate Partner Violence: Patient Unable To Answer (05/28/2023)    Humiliation, Afraid, Rape, and Kick questionnaire     Fear of Current or Ex-Partner: Patient unable to answer     Emotionally Abused: Patient unable to answer     Physically Abused: Patient unable to answer     Sexually Abused: Patient unable to answer      Allergies       Allergies  Allergen Reactions   Quinolones Other (See Comments)      Patient has an AAA (Abdominal Aortic Aneurysm)   Aspirin  Hives, Rash and Other (See Comments)      Reaction happened in childhood, but has been able to take 81 mg since then, however   Penicillins Other (See Comments)      Reaction unconfirmed- was stopped in the hospital after the initial reaction however (per family).              Current Outpatient Medications  Medication Sig Dispense Refill   acetaminophen  (TYLENOL ) 500 MG tablet Take 1,000 mg by mouth every 6 (six) hours as needed for mild pain (pain score 1-3).       aspirin  81 MG chewable tablet Chew 1 tablet (81 mg total) by mouth daily. 30 tablet 0   atorvastatin  (LIPITOR) 40 MG tablet Take 1 tablet (40 mg total) by mouth daily. 30 tablet 1   olmesartan (BENICAR) 40 MG tablet Take 40 mg by mouth daily.       ticagrelor  (BRILINTA ) 90 MG TABS tablet Take 1 tablet (90 mg total) by mouth 2 (two) times daily. 180 tablet 0      No current facility-administered medications for this visit.  Facility-Administered Medications Ordered in Other Visits   Medication Dose Route Frequency Provider Last Rate Last Admin   acetaminophen  (TYLENOL ) tablet 650 mg  650 mg Oral Q6H Sebastian Moles, MD   650 mg at 04/19/19 1207   HYDROmorphone  (DILAUDID ) injection 1 mg  1 mg Intravenous Q2H PRN Sebastian Moles, MD   1 mg at 04/15/19 2255   metoprolol  tartrate (LOPRESSOR ) injection 5 mg  5 mg Intravenous Q6H PRN Signe Mitzie LABOR, MD   5 mg at 04/12/19 0146        REVIEW OF SYSTEMS:  [X]  denotes positive finding, [ ]  denotes negative finding Cardiac   Comments:  Chest pain or chest pressure:      Shortness of breath upon exertion:      Short of breath when lying flat:      Irregular heart rhythm:             Vascular      Pain in calf, thigh, or hip brought on by ambulation:      Pain in feet at night that wakes you up from your sleep:       Blood clot in your veins:      Leg swelling:              Pulmonary      Oxygen at home:      Productive cough:       Wheezing:              Neurologic      Sudden weakness in arms or legs:       Sudden numbness in arms or legs:       Sudden onset of difficulty speaking or slurred speech:      Temporary loss of vision in one eye:       Problems with dizziness:              Gastrointestinal      Blood in stool:       Vomited blood:              Genitourinary      Burning when urinating:       Blood in urine:             Psychiatric      Major depression:              Hematologic      Bleeding problems:      Problems with blood clotting too easily:             Skin      Rashes or ulcers:             Constitutional      Fever or chills:          PHYSICAL EXAM: There were no vitals filed for this visit.       GENERAL: The patient is a well-nourished male, in no acute distress. The vital signs are documented above. CARDIAC: There is a regular rate and rhythm.  VASCULAR:  Palpable femoral pulses bilateral groins. Palpable DP pulses bilateral lower extremities. PULMONARY: No  respiratory distress. ABDOMEN: Soft and non-tender.  No pain with palpation of aneurysm. MUSCULOSKELETAL: There are no major deformities or cyanosis. NEUROLOGIC: No focal weakness or paresthesias are detected. PSYCHIATRIC: The patient has a normal affect.   DATA:    AAA duplex today shows his aneurysm has increased from 5.4 to 5.8 cm   CT abdomen pelvis 06/12/2023  without contrast:   IMPRESSION: 5.6 cm infrarenal abdominal aortic aneurysm. Recommend follow-up CT or MR as appropriate in 6 months and referral to or continued care with vascular specialist. (Ref.: J Vasc Surg. 2018; 67:2-77 and J Am Coll Radiol 2013;10(10):789-794.)   Small nonobstructive right renal calculus.   Sigmoid diverticulosis without inflammation.   Aortic Atherosclerosis (ICD10-I70.0).     Electronically Signed   By: Lynwood Landy Raddle M.D.   On: 06/12/2023 13:39     Assessment/Plan:   68 y.o. male, with history of CVA, tobacco abuse and AAA who presents for for follow-up of his AAA.  Last seen on 07/07/2023 with CT without contrast showing a 5.6 cm AAA.  We recommended waiting 6 months given the need to stay on Brilinta  following his stroke with ICA stent.  Duplex today shows his aneurysm is now increased to 5.8 cm.  I have recommended repair with stent graft.  I reviewed his most recent CT imaging from earlier this year s well as a CTA.  I do think he is a stent graft candidate.  I discussed in men we repair AAA at greater than 5.5 cm due to risk of rupture.  I have recommended bilateral transfemoral access in the OR with aortobiiliac stent graft.  He needs to hold his Brilinta  and okay to continue aspirin .  Risks benefits discussed including risk of anesthesia, MI, stroke, kidney dysfunction, vascular injury including cutdown on the groin and conversion to open.     Dale DOROTHA Gaskins, MD [facility] [place] Office: [phone]

## 2023-12-14 NOTE — Anesthesia Postprocedure Evaluation (Signed)
 Anesthesia Post Note  Patient: Dale Perez  Procedure(s) Performed: INSERTION, ENDOVASCULAR STENT GRAFT, AORTA, ABDOMINAL (Abdomen) ULTRASOUND GUIDANCE, FOR VASCULAR ACCESS (Bilateral: Groin)     Patient location during evaluation: PACU Anesthesia Type: General Level of consciousness: awake and alert Pain management: pain level controlled Vital Signs Assessment: post-procedure vital signs reviewed and stable Respiratory status: spontaneous breathing, nonlabored ventilation, respiratory function stable and patient connected to nasal cannula oxygen Cardiovascular status: blood pressure returned to baseline and stable Postop Assessment: no apparent nausea or vomiting Anesthetic complications: no   No notable events documented.  Last Vitals:  Vitals:   12/14/23 1445 12/14/23 1516  BP: 107/65 116/64  Pulse: (!) 57 60  Resp: 12 12  Temp:  37.2 C  SpO2: 99% 100%    Last Pain:  Vitals:   12/14/23 1549  TempSrc:   PainSc: 5                  Dale Perez

## 2023-12-14 NOTE — Anesthesia Procedure Notes (Signed)
 Procedure Name: Intubation Date/Time: 12/14/2023 9:37 AM  Performed by: Lansing Hildegard NOVAK, CRNAPre-anesthesia Checklist: Patient identified, Emergency Drugs available, Suction available and Patient being monitored Patient Re-evaluated:Patient Re-evaluated prior to induction Oxygen Delivery Method: Circle System Utilized Preoxygenation: Pre-oxygenation with 100% oxygen Induction Type: IV induction Ventilation: Mask ventilation without difficulty Laryngoscope Size: Mac and 4 Grade View: Grade I Tube type: Oral Tube size: 7.5 mm Number of attempts: 1 Airway Equipment and Method: Stylet and Oral airway Placement Confirmation: ETT inserted through vocal cords under direct vision, positive ETCO2 and breath sounds checked- equal and bilateral Secured at: 22 cm Tube secured with: Tape Dental Injury: Teeth and Oropharynx as per pre-operative assessment

## 2023-12-14 NOTE — Op Note (Signed)
 Date: December 14, 2023  Preoperative diagnosis: 5.8 cm abdominal aortic aneurysm  Postoperative diagnosis: Same  Procedure: 1.  Ultrasound-guided access bilateral common femoral arteries with Perclose closure for delivery of endograft 2.  Aortogram with catheter selection of aorta 3.  Aortobiiliac stent graft for repair of abdominal aortic aneurysm  Surgeon: Dr. Lonni DOROTHA Gaskins, MD  Assistant: Ahmed Holster, PA  Indications: 68 year old male followed for an abdominal aortic aneurysm.  His AAA is now increased to 5.8 cm and he presents for stent graft repair after risks benefits discussed.  Assistant was needed for bilateral groin access as well as catheter and sheath exchange and also stent graft appointment.  Findings: Ultrasound-guided access of bilateral common femoral arteries with Perclose closure.  An 72 French Gore dry seal sheath was placed in the right common femoral artery and a 12 French Gore dry seal sheath was placed in the left common femoral artery.  Main body device was delivered up the right and was deployed just below the renals which was a 32 mm x 14 mm x 14 cm and after cannulating the gate we extended on the left with preservation of the hypogastric into the common iliac with a 20 mm x 14 cm limb.  On the right we extended into the common iliac with preservation of the hypogastric with a 20 mm x 10 cm limb.  Widely patent endograft at completion.  Exclusion of aneurysm.  Anesthesia: General  Details: Patient was taken to the operating room after informed consent was obtained.  Placed on the operative table supine position.  General endotracheal anesthesia was induced.  Ultimately the bilateral groins and abdominal wall were then prepped and draped in standard sterile fashion.  Antibiotics were given and timeout performed.  Initially his ultrasound evaluate the right common femoral artery, it was patent, an image was saved.  There was some common femoral disease so  elected to access this above the posterior plaque with micro access and placed a microwire and made a stab incision with 11 blade scalpel dissected down with a hemostat.  I then used a micro sheath placed a Bentson wire used an 8 French dilator and deployed Perclose closure devices at 10:00 and 2:00 in the right common femoral artery.  We then did the same steps in the left groin again accessing this under ultrasound guidance placing a micro access needle, microwire, micro sheath dilating over Bentson wire with a 8 French dilator and placing Perclose closure devices at 10:00 and 2:00.  We had 8 French sheath in both groins.  The patient was given 100 units/kg IV heparin.  I then exchanged for Amplatz wires into the thoracic aorta from both groins with KMP catheter and upsized to an 18 French Gore dry seal sheath in the right common femoral artery and a 12 French Gore dry seal sheath in the left common femoral artery.  We checked an ACT to maintain greater than 250.  Additional IV heparin was given during the case.  We delivered to the main body device up the right which was a 32 mm x 14 mm x 14 cm with a marker pigtail catheter up the left.  We then got abdominal angiogram to identify the lowest left renal.  The main body of the device was then deployed in the infrarenal aorta just below the renals down to the gate with the help of my assistant.  I did constrain the device and push it up slightly.  We then came with a  buddy wire from the left groin and cannulated the gate and spun the pigtail catheter to confirm or in the true lumen of the endograft.  I then measured with a retrograde sheath shot to identify the hypogastric on the left.  We extended on the left with a 20 mm x 14 cm limb into the common iliac with preservation of the hypogastric.  On the right we did finish deploying the main body of the graft and then extended after identify the hypogastric with a 20 mm x 10 cm limb.  We then treated all proximal  distal ending zones and overlap the components with MOB balloon.  Final imaging showed widely patent endograft with patent renals and exclusion of the aneurysm.  I did take microcatheter and both sheaths were tied down initially in the right groin and then the left groin after we removed the sheath holding manual pressure tying down the Perclose devices and taking groin shots.  There was some calcified femoral disease on the right that we noted preop and the patient had a palpable pulse in the foot with a palpable femoral pulse and we elected to leave this.  Ultimately we gave protamine for reversal.  Incisions were both closed after we cut the Prolene sutures in both groins with 4-0 Monocryl and dermabond.  Taken to recovery in stable condition.  Complication: None  Condition: Stable  Lonni DOROTHA Gaskins, MD [facility] [place] Office: [phone]   [name]

## 2023-12-15 ENCOUNTER — Encounter (HOSPITAL_COMMUNITY): Payer: Self-pay | Admitting: Vascular Surgery

## 2023-12-15 DIAGNOSIS — Z95828 Presence of other vascular implants and grafts: Secondary | ICD-10-CM

## 2023-12-15 LAB — BASIC METABOLIC PANEL WITH GFR
Anion gap: 9 (ref 5–15)
BUN: 13 mg/dL (ref 8–23)
CO2: 24 mmol/L (ref 22–32)
Calcium: 8.5 mg/dL — ABNORMAL LOW (ref 8.9–10.3)
Chloride: 102 mmol/L (ref 98–111)
Creatinine, Ser: 0.95 mg/dL (ref 0.61–1.24)
GFR, Estimated: >60 mL/min (ref >60)
Glucose, Bld: 146 mg/dL — ABNORMAL HIGH (ref 70–99)
Potassium: 4.1 mmol/L (ref 3.5–5.1)
Sodium: 135 mmol/L (ref 135–145)

## 2023-12-15 LAB — POCT I-STAT 7, (LYTES, BLD GAS, ICA,H+H)
Acid-base deficit: 1 mmol/L (ref 0.0–2.0)
Bicarbonate: 24.7 mmol/L (ref 20.0–28.0)
Calcium, Ion: 1.21 mmol/L (ref 1.15–1.40)
HCT: 34 % — ABNORMAL LOW (ref 39.0–52.0)
Hemoglobin: 11.6 g/dL — ABNORMAL LOW (ref 13.0–17.0)
O2 Saturation: 100 %
Patient temperature: 35.4
Potassium: 4 mmol/L (ref 3.5–5.1)
Sodium: 142 mmol/L (ref 135–145)
TCO2: 26 mmol/L (ref 22–32)
pCO2 arterial: 39.5 mmHg (ref 32–48)
pH, Arterial: 7.397 (ref 7.35–7.45)
pO2, Arterial: 231 mmHg — ABNORMAL HIGH (ref 83–108)

## 2023-12-15 LAB — CBC
HCT: 35.9 % — ABNORMAL LOW (ref 39.0–52.0)
Hemoglobin: 11.6 g/dL — ABNORMAL LOW (ref 13.0–17.0)
MCH: 28.7 pg (ref 26.0–34.0)
MCHC: 32.3 g/dL (ref 30.0–36.0)
MCV: 88.9 fL (ref 80.0–100.0)
Platelets: 243 K/uL (ref 150–400)
RBC: 4.04 MIL/uL — ABNORMAL LOW (ref 4.22–5.81)
RDW: 14.3 % (ref 11.5–15.5)
WBC: 12.6 K/uL — ABNORMAL HIGH (ref 4.0–10.5)
nRBC: 0 % (ref 0.0–0.2)

## 2023-12-15 LAB — POCT ACTIVATED CLOTTING TIME
Activated Clotting Time: 141 s
Activated Clotting Time: 222 s
Activated Clotting Time: 250 s

## 2023-12-15 NOTE — Progress Notes (Signed)
 [facility]  Subjective  -no complaints other than some groin soreness   Objective 108/78 (!) 50 98 F (36.7 C) (Oral) (!) 23 96%  Intake/Output Summary (Last 24 hours) at 12/15/2023 0758 Last data filed at 12/15/2023 0700 Gross per 24 hour  Intake 2340 ml  Output 600 ml  Net 1740 ml    Bilateral groins without hematoma Bilateral DP pulses palpable  Laboratory Lab Results: Recent Labs    12/14/23 1125 12/15/23 0307  WBC 10.2 12.6*  HGB 11.5* 11.6*  HCT 35.9* 35.9*  PLT 234 243   BMET Recent Labs    12/14/23 1125 12/15/23 0307  NA 138 135  K 4.1 4.1  CL 106 102  CO2 23 24  GLUCOSE 107* 146*  BUN 15 13  CREATININE 0.85 0.95  CALCIUM  8.3* 8.5*    COAG Lab Results  Component Value Date   INR 1.1 12/14/2023   INR 1.0 12/08/2023   INR 1.1 05/21/2023   No results found for: PTT  Assessment/Planning:  Postop day 1 status post EVAR for 5.8 cm AAA.  Groins look great with palpable DP pulses.  Has voided.  Postop labs reassuring.  Hopefully home today with follow-up in 1 month with CTA.  Aspirin  at discharge.  Dale Perez 12/15/2023 7:58 AM --

## 2023-12-15 NOTE — Plan of Care (Signed)
 Pt d/c home

## 2023-12-15 NOTE — Plan of Care (Signed)

## 2023-12-15 NOTE — Discharge Instructions (Signed)

## 2023-12-16 LAB — TYPE AND SCREEN
ABO/RH(D): A POS
Antibody Screen: NEGATIVE
Unit division: 0
Unit division: 0

## 2023-12-16 LAB — BPAM RBC
Blood Product Expiration Date: 202511282359
Blood Product Expiration Date: 202511282359
Unit Type and Rh: 6200
Unit Type and Rh: 6200

## 2023-12-19 NOTE — Discharge Summary (Signed)
 EVAR Discharge Summary   Dale Perez 05-24-1955 68 y.o. male  MRN: [id number]  Admission Date: 12/14/2023  Discharge Date: 12/15/2023  Physician: Lonni Gaskins, MD  Admission Diagnosis: Infrarenal abdominal aortic aneurysm (AAA) without rupture [I71.43] AAA (abdominal aortic aneurysm) [I71.40]  Discharge Day Diagnosis: Infrarenal abdominal aortic aneurysm (AAA) without rupture [I71.43] AAA (abdominal aortic aneurysm) [I71.40]  Hospital Course:  The patient was admitted to the hospital and taken to the operating room on 12/14/2023 and underwent: EVAR.  The pt tolerated the procedure well and was transported to the PACU in excellent condition.   POD 1 he was doing well with minimal soreness at his groin access sites.  His groin access sites were soft without hematoma.  Bilateral lower extremities were well-perfused with palpable DP pulses.  He was tolerating a normal diet, voiding without difficulty, and ambulating without difficulty.  He was discharged home on POD 1.  CBC    Component Value Date/Time   WBC 12.6 (H) 12/15/2023 0307   RBC 4.04 (L) 12/15/2023 0307   HGB 11.6 (L) 12/15/2023 0307   HCT 35.9 (L) 12/15/2023 0307   PLT 243 12/15/2023 0307   MCV 88.9 12/15/2023 0307   MCH 28.7 12/15/2023 0307   MCHC 32.3 12/15/2023 0307   RDW 14.3 12/15/2023 0307   LYMPHSABS 2.2 08/30/2023 1717   MONOABS 0.6 08/30/2023 1717   EOSABS 0.6 (H) 08/30/2023 1717   BASOSABS 0.1 08/30/2023 1717    BMET    Component Value Date/Time   NA 135 12/15/2023 0307   K 4.1 12/15/2023 0307   CL 102 12/15/2023 0307   CO2 24 12/15/2023 0307   GLUCOSE 146 (H) 12/15/2023 0307   BUN 13 12/15/2023 0307   CREATININE 0.95 12/15/2023 0307   CALCIUM  8.5 (L) 12/15/2023 0307   GFRNONAA >60 12/15/2023 0307   GFRAA >60 04/19/2019 0516       Discharge Instructions     Call MD for:  redness, tenderness, or signs of infection (pain, swelling, redness, odor or green/yellow  discharge around incision site)   Complete by: As directed    Call MD for:  severe uncontrolled pain   Complete by: As directed    Call MD for:  temperature >100.4   Complete by: As directed    Diet - low sodium heart healthy   Complete by: As directed    Discharge wound care:   Complete by: As directed    Wash incisions daily with soap and water , then pat dry   Increase activity slowly   Complete by: As directed        Discharge Diagnosis:  Infrarenal abdominal aortic aneurysm (AAA) without rupture [I71.43] AAA (abdominal aortic aneurysm) [I71.40]  Secondary Diagnosis: Patient Active Problem List   Diagnosis Date Noted   AAA (abdominal aortic aneurysm) 12/14/2023   Essential hypertension 05/23/2023   Hyperlipemia 05/23/2023   Tobacco use disorder 05/23/2023   Acute ischemic left ICA stroke (HCC) 05/21/2023   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, right (HCC) 05/21/2023   Colon cancer screening 07/20/2019   AAA (abdominal aortic aneurysm) without rupture 05/10/2019   Debility 04/18/2019   Left rib fracture 04/09/2019   Past Medical History:  Diagnosis Date   AAA (abdominal aortic aneurysm) 04/2019   4.4cm   Arthritis    Essential hypertension 05/23/2023   Hyperlipidemia    Stroke (HCC) 05/2023   slurred speach; fingers numb on right hand     Allergies as of 12/15/2023  Reactions   Quinolones Other (See Comments)   Patient has an AAA (Abdominal Aortic Aneurysm)   Aspirin  Hives, Rash, Other (See Comments)   Reaction happened in childhood, but has been able to take 81 mg since then, however   Penicillins Other (See Comments)   Reaction unconfirmed- was stopped in the hospital after the initial reaction however (per family).        Medication List     STOP taking these medications    ticagrelor  90 MG Tabs tablet Commonly known as: BRILINTA        TAKE these medications    acetaminophen  500 MG tablet Commonly known as:  TYLENOL  Take 1,000 mg by mouth every 6 (six) hours as needed for mild pain (pain score 1-3).   amLODipine 5 MG tablet Commonly known as: NORVASC Take 5 mg by mouth daily.   Aspirin  Low Dose 81 MG chewable tablet Generic drug: aspirin  Chew 1 tablet (81 mg total) by mouth daily.   atorvastatin  40 MG tablet Commonly known as: LIPITOR Take 1 tablet (40 mg total) by mouth daily.   colchicine 0.6 MG tablet Take 0.6 mg by mouth every 4 (four) hours as needed (gout).   docusate sodium  100 MG capsule Commonly known as: COLACE Take 100 mg by mouth daily.   Fiber Therapy 500 MG Tabs Generic drug: Methylcellulose (Laxative) Take 500 mg by mouth daily.   indomethacin 50 MG capsule Commonly known as: INDOCIN Take 50 mg by mouth 3 (three) times daily as needed (gout).   magnesium  chloride 64 MG Tbec SR tablet Commonly known as: SLOW-MAG Take 1 tablet by mouth daily.   olmesartan 40 MG tablet Commonly known as: BENICAR Take 40 mg by mouth daily.   Potassium 99 MG Tabs Take 99 mg by mouth daily.               Discharge Care Instructions  (From admission, onward)           Start     Ordered   12/15/23 0000  Discharge wound care:       Comments: Wash incisions daily with soap and water , then pat dry   12/15/23 0815            Discharge Instructions:   [facility]  Discharge Instructions Endovascular Aortic Aneurysm Repair  Please refer to the following instructions for your post-procedure care. Your surgeon or Physician Assistant will discuss any changes with you.  Activity  You are encouraged to walk as much as you can. You can slowly return to normal activities but must avoid strenuous activity and heavy lifting until your doctor tells you it's OK. Avoid activities such as vacuuming or swinging a gold club. It is normal to feel tired for several weeks after your surgery. Do not drive until your doctor gives the OK and you are  no longer taking prescription pain medications. It is also normal to have difficulty with sleep habits, eating, and bowel movements after surgery. These will go away with time.  Bathing/Showering  You may shower after you go home. If you have an incision, do not soak in a bathtub, hot tub, or swim until the incision heals completely.  Incision Care  Shower every day. Clean your incision with mild soap and water . Pat the area dry with a clean towel. You do not need a bandage unless otherwise instructed. Do not apply any ointments or creams to your incision. If you clothing is irritating, you may  cover your incision with a dry gauze pad.  Diet  Resume your normal diet. There are no special food restrictions following this procedure. A low fat/low cholesterol diet is recommended for all patients with vascular disease. In order to heal from your surgery, it is CRITICAL to get adequate nutrition. Your body requires vitamins, minerals, and protein. Vegetables are the best source of vitamins and minerals. Vegetables also provide the perfect balance of protein. Processed food has little nutritional value, so try to avoid this.  Medications  Resume taking all of your medications unless your doctor or Physician Assistant tells you not to. If your incision is causing pain, you may take over-the-counter pain relievers such as acetaminophen  (Tylenol ). If you were prescribed a stronger pain medication, please be aware these medications can cause nausea and constipation. Prevent nausea by taking the medication with a snack or meal. Avoid constipation by drinking plenty of fluids and eating foods with a high amount of fiber, such as fruits, vegetables, and grains. Do not take Tylenol  if you are taking prescription pain medications.   Follow up  Our office will schedule a follow-up appointment with a C.T. scan 3-4 weeks after your surgery.  Please call us  immediately for any of the following  conditions  Severe or worsening pain in your legs or feet or in your abdomen back or chest. Increased pain, redness, drainage (pus) from your incision sit. Increased abdominal pain, bloating, nausea, vomiting or persistent diarrhea. Fever of 101 degrees or higher. Swelling in your leg (s),  Reduce your risk of vascular disease  Stop smoking. If you would like help call [facility] at 1-800-QUIT-NOW ((727)068-1243) or [facility] at (314)138-7453. Manage your cholesterol Maintain a desired weight Control your diabetes Keep your blood pressure down  If you have questions, please call the office at [phone].   Disposition: Home  Patient's condition: is Excellent  Follow up: 1. Dr. Gretta in 4 weeks with CTA protocol   Ahmed Holster, PA-C [facility] [phone] 12/19/2023  1:54 PM   - For VQI Registry use - Post-op:  Time to Extubation: [x]  In OR, [ ]  < 12 hrs, [ ]  12-24 hrs, [ ]  >=24 hrs Vasopressors Req. Post-op: No MI: No., [ ]  Troponin only, [ ]  EKG or Clinical New Arrhythmia: No CHF: No ICU Stay: 0 days Transfusion: No     If yes,  units given  Complications: Resp failure: No., [ ]  Pneumonia, [ ]  Ventilator Chg in renal function: No., [ ]  Inc. Cr > 0.5, [ ]  Temp. Dialysis,  [ ]  Permanent dialysis Leg ischemia: No., no Surgery needed, [ ]  Yes, Surgery needed,  [ ]  Amputation Bowel ischemia: No., [ ]  Medical Rx, [ ]  Surgical Rx Wound complication: No., [ ]  Superficial separation/infection, [ ]  Return to OR Return to OR: No  Return to OR for bleeding: No Stroke: No., [ ]  Minor, [ ]  Major  Discharge medications: Statin use:  Yes  ASA use:  Yes  Plavix use:  No  Beta blocker use:  No  ARB use:  Yes ACEI use:  No CCB use:  Yes

## 2024-01-04 ENCOUNTER — Other Ambulatory Visit: Payer: Self-pay

## 2024-01-04 DIAGNOSIS — I7143 Infrarenal abdominal aortic aneurysm, without rupture: Secondary | ICD-10-CM

## 2024-01-10 ENCOUNTER — Emergency Department
Admission: EM | Admit: 2024-01-10 | Discharge: 2024-01-10 | Disposition: A | Discharge status: Home / Self Care | Attending: Emergency Medicine | Admitting: Emergency Medicine

## 2024-01-10 ENCOUNTER — Emergency Department

## 2024-01-10 ENCOUNTER — Other Ambulatory Visit: Payer: Self-pay

## 2024-01-10 DIAGNOSIS — R4182 Altered mental status, unspecified: Secondary | ICD-10-CM | POA: Insufficient documentation

## 2024-01-10 DIAGNOSIS — Z8673 Personal history of transient ischemic attack (TIA), and cerebral infarction without residual deficits: Secondary | ICD-10-CM | POA: Diagnosis not present

## 2024-01-10 DIAGNOSIS — I700 Atherosclerosis of aorta: Secondary | ICD-10-CM | POA: Insufficient documentation

## 2024-01-10 DIAGNOSIS — R7881 Bacteremia: Secondary | ICD-10-CM | POA: Insufficient documentation

## 2024-01-10 DIAGNOSIS — I2510 Atherosclerotic heart disease of native coronary artery without angina pectoris: Secondary | ICD-10-CM | POA: Diagnosis not present

## 2024-01-10 DIAGNOSIS — R42 Dizziness and giddiness: Secondary | ICD-10-CM | POA: Diagnosis present

## 2024-01-10 DIAGNOSIS — E860 Dehydration: Secondary | ICD-10-CM

## 2024-01-10 DIAGNOSIS — N179 Acute kidney failure, unspecified: Secondary | ICD-10-CM

## 2024-01-10 DIAGNOSIS — N281 Cyst of kidney, acquired: Secondary | ICD-10-CM | POA: Insufficient documentation

## 2024-01-10 DIAGNOSIS — K5730 Diverticulosis of large intestine without perforation or abscess without bleeding: Secondary | ICD-10-CM | POA: Diagnosis not present

## 2024-01-10 DIAGNOSIS — I10 Essential (primary) hypertension: Secondary | ICD-10-CM | POA: Diagnosis not present

## 2024-01-10 DIAGNOSIS — R059 Cough, unspecified: Secondary | ICD-10-CM | POA: Diagnosis not present

## 2024-01-10 LAB — URINALYSIS, ROUTINE W REFLEX MICROSCOPIC
Bacteria, UA: NONE SEEN
Bilirubin Urine: NEGATIVE
Glucose, UA: NEGATIVE mg/dL
Hgb urine dipstick: NEGATIVE
Ketones, ur: 5 mg/dL — AB
Leukocytes,Ua: NEGATIVE
Nitrite: NEGATIVE
Protein, ur: 30 mg/dL — AB
Specific Gravity, Urine: 1.025 (ref 1.005–1.030)
pH: 5 (ref 5.0–8.0)

## 2024-01-10 LAB — LACTIC ACID, PLASMA
Lactic Acid, Venous: 5.4 mmol/L (ref 0.5–1.9)
Lactic Acid, Venous: 1.5 mmol/L (ref 0.5–1.9)

## 2024-01-10 LAB — COMPREHENSIVE METABOLIC PANEL WITH GFR
ALT: 16 U/L (ref 0–44)
AST: 25 U/L (ref 15–41)
Albumin: 4.4 g/dL (ref 3.5–5.0)
Alkaline Phosphatase: 127 U/L — ABNORMAL HIGH (ref 38–126)
Anion gap: 14 (ref 5–15)
BUN: 23 mg/dL (ref 8–23)
CO2: 26 mmol/L (ref 22–32)
Calcium: 10.3 mg/dL (ref 8.9–10.3)
Chloride: 102 mmol/L (ref 98–111)
Creatinine, Ser: 1.42 mg/dL — ABNORMAL HIGH (ref 0.61–1.24)
GFR, Estimated: 54 mL/min — ABNORMAL LOW (ref >60)
Glucose, Bld: 131 mg/dL — ABNORMAL HIGH (ref 70–99)
Potassium: 5.1 mmol/L (ref 3.5–5.1)
Sodium: 142 mmol/L (ref 135–145)
Total Bilirubin: 0.3 mg/dL (ref 0.0–1.2)
Total Protein: 7.6 g/dL (ref 6.5–8.1)

## 2024-01-10 LAB — RESP PANEL BY RT-PCR (RSV, FLU A&B, COVID)  RVPGX2
Influenza A by PCR: NEGATIVE
Influenza B by PCR: NEGATIVE
Resp Syncytial Virus by PCR: NEGATIVE
SARS Coronavirus 2 by RT PCR: NEGATIVE

## 2024-01-10 LAB — CBC
HCT: 40.6 % (ref 39.0–52.0)
Hemoglobin: 12.9 g/dL — ABNORMAL LOW (ref 13.0–17.0)
MCH: 28.2 pg (ref 26.0–34.0)
MCHC: 31.8 g/dL (ref 30.0–36.0)
MCV: 88.8 fL (ref 80.0–100.0)
Platelets: 298 K/uL (ref 150–400)
RBC: 4.57 MIL/uL (ref 4.22–5.81)
RDW: 13.9 % (ref 11.5–15.5)
WBC: 9.7 K/uL (ref 4.0–10.5)
nRBC: 0 % (ref 0.0–0.2)

## 2024-01-10 LAB — TROPONIN T, HIGH SENSITIVITY: Troponin T High Sensitivity: <15 ng/L (ref 0–19)

## 2024-01-10 MED ORDER — DOXYCYCLINE HYCLATE 100 MG PO CAPS: 100.0000 mg | ORAL_CAPSULE | Freq: Two times a day (BID) | ORAL | 0 refills | Status: DC

## 2024-01-10 MED ORDER — SODIUM CHLORIDE 0.9 % IV BOLUS
1000.0000 mL | Freq: Once | INTRAVENOUS | Status: AC
  Administered 2024-01-10: 1000 mL via INTRAVENOUS

## 2024-01-10 MED ORDER — IOHEXOL 350 MG/ML SOLN
100.0000 mL | Freq: Once | INTRAVENOUS | Status: AC | PRN
  Administered 2024-01-10: 100 mL via INTRAVENOUS

## 2024-01-10 MED ORDER — ONDANSETRON 4 MG PO TBDP
4.0000 mg | ORAL_TABLET | Freq: Once | ORAL | Status: AC
  Administered 2024-01-10: 4 mg via ORAL
  Filled 2024-01-10: qty 1

## 2024-01-10 MED ORDER — ONDANSETRON 4 MG PO TBDP: 4.0000 mg | ORAL_TABLET | Freq: Four times a day (QID) | ORAL | 0 refills | Status: AC | PRN

## 2024-01-10 MED ORDER — DOXYCYCLINE HYCLATE 100 MG PO TABS
100.0000 mg | ORAL_TABLET | Freq: Once | ORAL | Status: AC
  Administered 2024-01-10: 100 mg via ORAL
  Filled 2024-01-10: qty 1

## 2024-01-10 NOTE — ED Provider Notes (Signed)
 Mild hypothermia associated with generalized feeling of weakness fatigue.  Elevated creatinine of indicative of probable AKI and is lactic acid quite high.  He is not have any obvious evidence of any acute bacterial infection at this time but is pending imaging including chest abdomen pelvis.  Urinalysis without acute  He is receiving IV fluids for hydration.  Will continue to monitor this closely.    CT Angio Chest/Abd/Pel for Dissection W and/or W/WO Result Date: 01/10/2024 EXAM: CTA CHEST, ABDOMEN AND PELVIS WITHOUT AND WITH CONTRAST 01/10/2024 04:23:57 PM TECHNIQUE: CTA of the chest was performed without and with the administration of 100 mL of intravenous iohexol  (OMNIPAQUE ) 350 MG/ML injection. CTA of the abdomen and pelvis was performed without and with the administration of 100 mL of intravenous iohexol  (OMNIPAQUE ) 350 MG/ML injection. Multiplanar reformatted images are provided for review. MIP images are provided for review. Automated exposure control, iterative reconstruction, and/or weight based adjustment of the mA/kV was utilized to reduce the radiation dose to as low as reasonably achievable. COMPARISON: 06/12/2023 CLINICAL HISTORY: Acute aortic syndrome (AAS) suspected. FINDINGS: VASCULATURE: AORTA: Aortic atherosclerosis. Status post stent graft repair of infrarenal abdominal aortic aneurysm. Graft and limbs are widely patent. No definite endoleak. No dissection is noted. PULMONARY ARTERIES: No pulmonary embolism with the limits of this exam. GREAT VESSELS OF AORTIC ARCH: No acute finding. No dissection. No arterial occlusion or significant stenosis. 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 SUPERIOR MESENTERIC ARTERY: No acute finding. No occlusion or significant stenosis. INFERIOR MESENTERIC ARTERY: No acute finding. No occlusion or significant stenosis. RENAL ARTERIES: No acute finding. No occlusion or significant stenosis. ILIAC  ARTERIES: No acute finding. No occlusion or significant stenosis. CHEST: MEDIASTINUM: Coronary artery calcifications are noted. No mediastinal lymphadenopathy. The heart and pericardium demonstrate no acute abnormality. LUNGS AND PLEURA: Minimal left basilar septum atelectasis is noted. There appears to be possible aspirated material in left lower lobe bronchi. No focal consolidation or pulmonary edema. No evidence of pleural effusion or pneumothorax. THORACIC BONES AND SOFT TISSUES: No acute bone or soft tissue abnormality. ABDOMEN AND PELVIS: LIVER: The liver is unremarkable. GALLBLADDER AND BILE DUCTS: Gallbladder is unremarkable. No biliary ductal dilatation. SPLEEN: The spleen is unremarkable. PANCREAS: The pancreas is unremarkable. ADRENAL GLANDS: Bilateral adrenal glands demonstrate no acute abnormality. KIDNEYS, URETERS AND BLADDER: 5 cm right renal cyst is noted. No stones in the kidneys or ureters. No hydronephrosis. No perinephric or periureteral stranding. Urinary bladder is unremarkable. GI AND BOWEL: Sigmoid diverticulosis without inflammation. Status post appendectomy. Stomach and duodenal sweep demonstrate no acute abnormality. There is no bowel obstruction. No abnormal bowel wall thickening or distension. REPRODUCTIVE: Reproductive organs are unremarkable. PERITONEUM AND RETROPERITONEUM: No ascites or free air. LYMPH NODES: No lymphadenopathy. ABDOMINAL BONES AND SOFT TISSUES: No acute abnormality of the bones. No acute soft tissue abnormality. IMPRESSION: 1. No evidence of acute aortic syndrome. 2. Status post stent graft repair of infrarenal abdominal aortic aneurysm with widely patent graft and limbs, and no definite endoleak. 3. 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 4. Sigmoid diverticulosis without inflammation. 5. Possible aspirated material seen within left lower lobe bronchi. Electronically signed by: Lynwood Seip MD 01/10/2024 04:44 PM EST RP  Workstation: [id number]   CT Head Wo Contrast Result Date: 01/10/2024 EXAM: CT HEAD WITHOUT CONTRAST 01/10/2024 04:23:57 PM TECHNIQUE: CT of the head was performed without the administration of intravenous contrast. Automated exposure control, iterative  reconstruction, and/or weight based adjustment of the mA/kV was utilized to reduce the radiation dose to as low as reasonably achievable. COMPARISON: None available. CLINICAL HISTORY: Mental status change, unknown cause FINDINGS: BRAIN AND VENTRICLES: No acute hemorrhage. No evidence of acute infarct. Remote right basal ganglia and left frontoparietal infarcts. No hydrocephalus. No extra-axial collection. No mass effect or midline shift. ORBITS: No acute abnormality. SINUSES: Mild paranasal sinus mucosal thickening. SOFT TISSUES AND SKULL: Left mastoidectomy. No skull fracture. IMPRESSION: 1. No acute intracranial abnormality. 2. Remote right basal ganglia and left frontoparietal infarcts. Electronically signed by: Gilmore Molt MD 01/10/2024 04:29 PM EST RP Workstation: [id number]      Lactic acid has cleared.  Vital signs have normalized patient asymptomatic     Dicky Anes, MD 01/10/24 1740

## 2024-01-10 NOTE — ED Provider Notes (Signed)
 [facility] Provider Note    Event Date/Time   First MD Initiated Contact with Patient 01/10/24 1429     (approximate)   History   Dizziness   HPI  Dale Perez is a 68 y.o. male history of hypertension, AAA s/p repair, presenting with lightheadedness.  States no chest pain or shortness of breath, feels like his hands are cold.  No focal weakness or numbness.  Had told EMS that he was having a cough but denies it now.  Denies any abdominal pain or back pain.  Denies any fever or urinary symptoms.  No nausea vomiting or diarrhea.  Independent history from EMS, pressures were soft for them.  Has history of high blood pressure and took it this morning, typically noncompliant with medications.  On independent chart review, he was admitted in November for AAA repair.     Physical Exam   Triage Vital Signs: ED Triage Vitals  Encounter Vitals Group     BP 01/10/24 1425 102/70     Girls Systolic BP Percentile --      Girls Diastolic BP Percentile --      Boys Systolic BP Percentile --      Boys Diastolic BP Percentile --      Pulse Rate 01/10/24 1425 73     Resp 01/10/24 1425 13     Temp --      Temp src --      SpO2 01/10/24 1425 100 %     Weight 01/10/24 1415 157 lb 3 oz (71.3 kg)     Height 01/10/24 1415 6' 1 (1.854 m)     Head Circumference --      Peak Flow --      Pain Score 01/10/24 1415 0     Pain Loc --      Pain Education --      Exclude from Growth Chart --     Most recent vital signs: Vitals:   01/10/24 1425 01/10/24 1504  BP: 102/70   Pulse: 73   Resp: 13   Temp:  (!) 96.6 F (35.9 C)  SpO2: 100%      General: Awake, no distress.  CV:  Good peripheral perfusion.  Resp:  Normal effort.  No tachypnea or respiratory distress Abd:  No distention.  Soft nontender Other:  Moving all 4 extremities without focal weakness or numbness, palpable radial pulses bilaterally.   ED Results / Procedures / Treatments    Labs (all labs ordered are listed, but only abnormal results are displayed) Labs Reviewed  COMPREHENSIVE METABOLIC PANEL WITH GFR - Abnormal; Notable for the following components:      Result Value   Glucose, Bld 131 (*)    Creatinine, Ser 1.42 (*)    Alkaline Phosphatase 127 (*)    GFR, Estimated 54 (*)    All other components within normal limits  CBC - Abnormal; Notable for the following components:   Hemoglobin 12.9 (*)    All other components within normal limits  RESP PANEL BY RT-PCR (RSV, FLU A&B, COVID)  RVPGX2  CULTURE, BLOOD (ROUTINE X 2)  CULTURE, BLOOD (ROUTINE X 2)  URINALYSIS, ROUTINE W REFLEX MICROSCOPIC  LACTIC ACID, PLASMA  LACTIC ACID, PLASMA  CBG MONITORING, ED  TROPONIN T, HIGH SENSITIVITY     EKG  EKG shows, sinus rhythm, rate 86, normal QS, normal QTc, no obvious ischemic ST elevation, T wave inversion to aVL,    PROCEDURES:  Critical Care  performed: No  Procedures   MEDICATIONS ORDERED IN ED: Medications  sodium chloride  0.9 % bolus 1,000 mL (1,000 mLs Intravenous New Bag/Given 01/10/24 1459)     IMPRESSION / MDM / ASSESSMENT AND PLAN / ED COURSE  I reviewed the triage vital signs and the nursing notes.                              Differential diagnosis includes, but is not limited to, hydration, electrolyte derangements, atypical ACS, arrhythmia, did consider given his recent AAA repair if there is a leak or surgical complication.  Also considered viral illness, UTI, patient initially noted to EMS that he was coughing but now says no, did consider pneumonia.  Will get labs, EKG, troponin, CT, lactic acid, blood cultures, IV fluids.  Reassess.  Patient's presentation is most consistent with acute presentation with potential threat to life or bodily function.  Patient noted to be intermittently answering questions, is quite hard of hearing, no focal weakness or numbness of facial droop, no slurred speech.  No evidence of aphasia at this  time.  Will add CT head just make sure there is no intracranial hemorrhage  Independent interpretation of labs and imaging below.  Patient signed out pending imaging and rest of labs.   Clinical Course as of 01/10/24 1533  Sun Jan 10, 2024  1533 Independent review of labs, no leukocytosis, electrolytes not severely deranged, alk phos is mildly elevated, he has an AKI, getting fluids at this time. [TT]    Clinical Course User Index [TT] Way[place], Lorelle Cummins, MD     FINAL CLINICAL IMPRESSION(S) / ED DIAGNOSES   Final diagnoses:  Lightheadedness  AKI (acute kidney injury)     Rx / DC Orders   ED Discharge Orders     None        Note:  This document was prepared using Dragon voice recognition software and may include unintentional dictation errors.    [place] Lorelle Cummins, MD 01/10/24 [phone]

## 2024-01-10 NOTE — ED Notes (Addendum)
 1st set of Cultures drawn and sent at 1443.

## 2024-01-10 NOTE — ED Provider Notes (Signed)
 Patient currently feeling much better.  His family at the bedside reports he looks well as well.  Family reports his second time he had to come to ER for they think his dehydration.  Today he has only had 1 cup of coffee and otherwise he just drinks [facility].  He has been told several times that he needs to add water  and decrease caffeine intake.  He feels much better now he reports he feels back to normal.  His lactate was quite elevated.  He is currently asymptomatic without complaints.  Temperature is improved.  He said 1 L of fluid and will receive a second liter.  Will recheck his lactate and if this is clearing and his imaging remaining workup is reassuring I think he may be able to go home.  He does not endorse any acute obvious infectious symptoms ongoing pain or discomfort blood pressure currently in the 130s systolic and he is awake well-oriented and well-appearing.   Dicky Anes, MD 01/10/24 1705

## 2024-01-10 NOTE — ED Provider Notes (Signed)
 Vitals:   01/10/24 1700 01/10/24 1730  BP: 125/68 129/71  Pulse: (!) 56 (!) 59  Resp: 15 15  Temp:    SpO2:  99%     Patient resting comfortably feels well.  Discussed with him and his wife we will start him on doxycycline  9 (Penicillin allergy, unknown but felt severe or may have been from a quinolone, unknown which) in the event he has very mild aspiration and he does not seem to be particularly symptomatic.  He is well-appearing feels well.  Will follow-up with his primary doctor.  CT imaging reassuring has follow-up with vascular already planned as well.  Return precautions and treatment recommendations and follow-up discussed with the patient who is agreeable with the plan.    Dale Anes, MD 01/10/24 1750

## 2024-01-10 NOTE — ED Triage Notes (Addendum)
 Pt in via [facility] from home for lightheadedness while working with his family. Pt has hx of triple A and had a stroke in April. 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 Pt took BP medicine this morning but is usually non-compliant with meds. Pt noted to have red rash across belly. Pt states that is new. Pt also endorse having a cough over the past week.  Right Arm BP: 91/58 Left Arm: 102/70  EMS vitals: 97/53 106/61

## 2024-01-10 NOTE — ED Notes (Signed)
 Pt to CT

## 2024-01-10 NOTE — ED Notes (Signed)
2nd set of cultures sent

## 2024-01-10 NOTE — ED Notes (Signed)
Repeat labs sent.

## 2024-01-11 ENCOUNTER — Telehealth: Payer: Self-pay | Admitting: Medical Oncology

## 2024-01-11 ENCOUNTER — Telehealth: Payer: Self-pay

## 2024-01-11 LAB — BLOOD CULTURE ID PANEL (REFLEXED) - BCID2

## 2024-01-11 NOTE — Telephone Encounter (Signed)
 Pt was called about possible blood culture contaminate- Dr Arlander states if pt is feeling better than nothing for pt to do. Pt states that he feels better, pt advised to finish abx in its entirety.

## 2024-01-11 NOTE — Telephone Encounter (Signed)
 Pt's wife called asking if the CTA chest/abd/pel the pt had on 01/10/24 would be ok to use for pt's postop appt with Dr. Gretta on 02/09/24.    Advised the 01/10/24 CTA is ok to use and no need for repeat on 01/27/24.

## 2024-01-13 LAB — CULTURE, BLOOD (ROUTINE X 2)

## 2024-01-25 ENCOUNTER — Telehealth: Payer: Self-pay | Admitting: Vascular Surgery

## 2024-01-25 NOTE — Telephone Encounter (Signed)
 Left Pt VM to schedule CT

## 2024-01-27 ENCOUNTER — Ambulatory Visit (HOSPITAL_COMMUNITY)

## 2024-02-02 ENCOUNTER — Encounter (INDEPENDENT_AMBULATORY_CARE_PROVIDER_SITE_OTHER): Payer: Self-pay | Admitting: *Deleted

## 2024-02-08 NOTE — Progress Notes (Signed)
 "   Patient name: Dale Perez MRN: [id number] DOB: [birth year] Sex: male  REASON FOR CONSULT: Postop status post EVAR  HPI: Dale Perez is a 69 y.o. male, that presents for postop after recent EVAR on 12/14/2023 for a 5.8 cm AAA.  Patient did well and was discharged home.  After discharge about 1 month later on 01/10/2024 he presented with dehydration with a lactic acidosis to the [facility] ED.  Ultimately he was treated for a aspiration pneumonia with 7 days of doxycycline .  His blood cultures did grow Staph epidermidis in 2 out of 2 cultures.  States he completed his antibiotics.  Has been feeling good.  No fevers or chills.  No abdominal pain.  Past Medical History:  Diagnosis Date   AAA (abdominal aortic aneurysm) 04/2019   4.4cm   Arthritis    Essential hypertension 05/23/2023   Hyperlipidemia    Stroke (HCC) 05/2023   slurred speach; fingers numb on right hand    Past Surgical History:  Procedure Laterality Date   ABDOMINAL AORTIC ENDOVASCULAR STENT GRAFT N/A 12/14/2023   Procedure: INSERTION, ENDOVASCULAR STENT GRAFT, AORTA, ABDOMINAL;  Surgeon: Gretta Lonni PARAS, MD;  Location: [facility] OR;  Service: Vascular;  Laterality: N/A;   APPENDECTOMY     COLONOSCOPY  11/2008   Rourk; sigmoid diverticula, otherwise normal exam. Repeat in 2020.    COLONOSCOPY N/A 09/09/2019   Procedure: COLONOSCOPY;  Surgeon: Shaaron Lamar HERO, MD;  Location: [facility]ITE;  Service: Endoscopy;  Laterality: N/A;  9:45am   I & D EXTREMITY Left 08/10/2015   Procedure: IRRIGATION AND DEBRIDEMENT EXTREMITY;  Surgeon: Alm Hummer, MD;  Location: [facility] OR;  Service: Orthopedics;  Laterality: Left;   INNER EAR SURGERY Left    IR CT HEAD LTD  05/21/2023   IR CT HEAD LTD  05/21/2023   IR INTRAVSC STENT CERV CAROTID W/O EMB-PROT MOD SED INC ANGIO  05/21/2023   IR PERCUTANEOUS ART THROMBECTOMY/INFUSION INTRACRANIAL INC DIAG ANGIO  05/21/2023   IR RADIOLOGIST EVAL & MGMT  06/19/2023   KNEE CARTILAGE SURGERY  Right 02/2019   KNEE SURGERY Left    RADIOLOGY WITH ANESTHESIA N/A 05/21/2023   Procedure: RADIOLOGY WITH ANESTHESIA;  Surgeon: Radiologist, Medication, MD;  Location: [facility] OR;  Service: Radiology;  Laterality: N/A;   SHOULDER ARTHROSCOPY Left 08/2018   SHOULDER SURGERY Right    TENDON REPAIR N/A 08/10/2015   Procedure: TENDON REPAIR WITH WOUND CLOSURE;  Surgeon: Alm Hummer, MD;  Location: [facility] OR;  Service: Orthopedics;  Laterality: N/A;   TONSILLECTOMY     ULTRASOUND GUIDANCE FOR VASCULAR ACCESS Bilateral 12/14/2023   Procedure: ULTRASOUND GUIDANCE, FOR VASCULAR ACCESS;  Surgeon: Gretta Lonni PARAS, MD;  Location: [facility] OR;  Service: Vascular;  Laterality: Bilateral;    Family History  Problem Relation Age of Onset   Heart attack Father    Colon cancer Neg Hx    Colon polyps Neg Hx     SOCIAL HISTORY: Social History   Socioeconomic History   Marital status: Married    Spouse name: Not on file   Number of children: Not on file   Years of education: Not on file   Highest education level: Not on file  Occupational History   Not on file  Tobacco Use   Smoking status: Every Day    Current packs/day: 1.00    Average packs/day: 1 pack/day for 40.0 years (40.0 ttl pk-yrs)    Types: Cigarettes   Smokeless tobacco: Never  Vaping Use  Vaping status: Never Used  Substance and Sexual Activity   Alcohol use: Not Currently    Comment: rarely   Drug use: Never   Sexual activity: Yes  Other Topics Concern   Not on file  Social History Narrative   ** Merged History Encounter **       Social Drivers of Health   Tobacco Use: High Risk (01/10/2024)   Patient History    Smoking Tobacco Use: Every Day    Smokeless Tobacco Use: Never    Passive Exposure: Not on file  Financial Resource Strain: Not on file  Food Insecurity: No Food Insecurity (05/28/2023)   Hunger Vital Sign    Worried About Running Out of Food in the Last Year: Never true    Ran Out of Food in the Last Year:  Never true  Transportation Needs: No Transportation Needs (05/28/2023)   PRAPARE - Administrator, Civil Service (Medical): No    Lack of Transportation (Non-Medical): No  Physical Activity: Not on file  Stress: Not on file  Social Connections: Not on file  Intimate Partner Violence: Patient Unable To Answer (05/28/2023)   Humiliation, Afraid, Rape, and Kick questionnaire    Fear of Current or Ex-Partner: Patient unable to answer    Emotionally Abused: Patient unable to answer    Physically Abused: Patient unable to answer    Sexually Abused: Patient unable to answer  Depression (PHQ2-9): Not on file  Alcohol Screen: Not on file  Housing: Unknown (05/28/2023)   Housing Stability Vital Sign    Unable to Pay for Housing in the Last Year: No    Number of Times Moved in the Last Year: Not on file    Homeless in the Last Year: No  Utilities: Not At Risk (05/28/2023)   AHC Utilities    Threatened with loss of utilities: No  Health Literacy: Not on file    Allergies[1]  Current Outpatient Medications  Medication Sig Dispense Refill   acetaminophen  (TYLENOL ) 500 MG tablet Take 1,000 mg by mouth every 6 (six) hours as needed for mild pain (pain score 1-3).     amLODipine  (NORVASC ) 5 MG tablet Take 5 mg by mouth daily.     aspirin  81 MG chewable tablet Chew 1 tablet (81 mg total) by mouth daily. 30 tablet 0   atorvastatin  (LIPITOR) 40 MG tablet Take 1 tablet (40 mg total) by mouth daily. 30 tablet 1   colchicine 0.6 MG tablet Take 0.6 mg by mouth every 4 (four) hours as needed (gout).     docusate sodium  (COLACE) 100 MG capsule Take 100 mg by mouth daily.     doxycycline  (VIBRAMYCIN ) 100 MG capsule Take 1 capsule (100 mg total) by mouth 2 (two) times daily. 14 capsule 0   indomethacin (INDOCIN) 50 MG capsule Take 50 mg by mouth 3 (three) times daily as needed (gout).     magnesium  chloride (SLOW-MAG) 64 MG TBEC SR tablet Take 1 tablet by mouth daily.     Methylcellulose,  Laxative, (FIBER THERAPY) 500 MG TABS Take 500 mg by mouth daily.     olmesartan (BENICAR) 40 MG tablet Take 40 mg by mouth daily.     ondansetron  (ZOFRAN -ODT) 4 MG disintegrating tablet Take 1 tablet (4 mg total) by mouth every 6 (six) hours as needed for nausea or vomiting. 20 tablet 0   Potassium 99 MG TABS Take 99 mg by mouth daily.     No current facility-administered medications for this visit.  Facility-Administered Medications Ordered in Other Visits  Medication Dose Route Frequency Provider Last Rate Last Admin   acetaminophen  (TYLENOL ) tablet 650 mg  650 mg Oral Q6H Sebastian Moles, MD   650 mg at 04/19/19 1207   HYDROmorphone  (DILAUDID ) injection 1 mg  1 mg Intravenous Q2H PRN Sebastian Moles, MD   1 mg at 04/15/19 2255   metoprolol  tartrate (LOPRESSOR ) injection 5 mg  5 mg Intravenous Q6H PRN Signe Mitzie LABOR, MD   5 mg at 04/12/19 0146    REVIEW OF SYSTEMS:  [X]  denotes positive finding, [ ]  denotes negative finding Cardiac  Comments:  Chest pain or chest pressure:    Shortness of breath upon exertion:    Short of breath when lying flat:    Irregular heart rhythm:        Vascular    Pain in calf, thigh, or hip brought on by ambulation:    Pain in feet at night that wakes you up from your sleep:     Blood clot in your veins:    Leg swelling:         Pulmonary    Oxygen at home:    Productive cough:     Wheezing:         Neurologic    Sudden weakness in arms or legs:     Sudden numbness in arms or legs:     Sudden onset of difficulty speaking or slurred speech:    Temporary loss of vision in one eye:     Problems with dizziness:         Gastrointestinal    Blood in stool:     Vomited blood:         Genitourinary    Burning when urinating:     Blood in urine:        Psychiatric    Major depression:         Hematologic    Bleeding problems:    Problems with blood clotting too easily:        Skin    Rashes or ulcers:        Constitutional    Fever  or chills:      PHYSICAL EXAM: There were no vitals filed for this visit.  GENERAL: The patient is a well-nourished male, in no acute distress. The vital signs are documented above. CARDIAC: There is a regular rate and rhythm.  VASCULAR:  Bilateral femoral pulses with healed groin incisions Bilateral DP pulses palpable PULMONARY: No respiratory distress. ABDOMEN: Soft and non-tender. MUSCULOSKELETAL: There are no major deformities or cyanosis. SKIN: There are no ulcers or rashes noted. PSYCHIATRIC: The patient has a normal affect.  DATA:   CTA reviewed from 01/10/2024 with widely patent endograft with no endoleak.  Assessment/Plan:  69 y.o. male, that presents for postop after recent EVAR on 12/14/2023 for a 5.8 cm AAA.  Patient did well and was discharged home.  After discharge about 1 month later on 01/10/2024 he presented with dehydration with a lactic acidosis.  Ultimately he was treated for a aspiration pneumonia with 7 days of doxycycline .  His blood cultures did grow Staph epidermidis in 2 out of 2 cultures.  Discussed his CT from 01/10/2024 shows stent graft in good position with widely patent limbs and exclusion of aneurysm with no endoleak.  I discussed my concern about his blood cultures growing Staph epidermidis in 2 out of 2 cultures.  I only noted this yesterday in preparation for his clinic  visit today and otherwise was unaware.  I would like to put him on 6 weeks of doxycycline  to prevent graft infection and discussed we are being very precautions and treating as if he had a true bacteremia.  Certainly this could be a contaminant but the fact that he grew the bacteria in both culture bottles is concerning.  No fluid around the graft on CT.  He otherwise is feeling great.  Will follow-up in 6 months with duplex.     Lonni DOROTHA Gaskins, MD [facility] Office: [phone]        [1]  Allergies Allergen Reactions   Quinolones Other  (See Comments)    Patient has an AAA (Abdominal Aortic Aneurysm)   Aspirin  Hives, Rash and Other (See Comments)    Reaction happened in childhood, but has been able to take 81 mg since then, however   Penicillins Other (See Comments)    Reaction unconfirmed- was stopped in the hospital after the initial reaction however (per family).   "

## 2024-02-09 ENCOUNTER — Encounter: Payer: Self-pay | Admitting: Vascular Surgery

## 2024-02-09 ENCOUNTER — Encounter: Payer: PRIVATE HEALTH INSURANCE | Admitting: Vascular Surgery

## 2024-02-09 VITALS — BP 150/75 | HR 65 | Temp 98.0°F | Resp 18 | Ht 73.0 in | Wt 158.0 lb

## 2024-02-09 DIAGNOSIS — I7143 Infrarenal abdominal aortic aneurysm, without rupture: Secondary | ICD-10-CM

## 2024-02-09 MED ORDER — DOXYCYCLINE HYCLATE 100 MG PO CAPS: 100.0000 mg | ORAL_CAPSULE | Freq: Two times a day (BID) | ORAL | 0 refills | Status: AC

## 2024-02-11 ENCOUNTER — Other Ambulatory Visit: Payer: Self-pay | Admitting: *Deleted

## 2024-02-11 DIAGNOSIS — Z9889 Other specified postprocedural states: Secondary | ICD-10-CM

## 2024-03-30 ENCOUNTER — Ambulatory Visit (HOSPITAL_BASED_OUTPATIENT_CLINIC_OR_DEPARTMENT_OTHER): Admit: 2024-03-30 | Admitting: Otolaryngology

## 2024-03-30 ENCOUNTER — Encounter (HOSPITAL_BASED_OUTPATIENT_CLINIC_OR_DEPARTMENT_OTHER): Payer: Self-pay

## 2024-06-30 ENCOUNTER — Ambulatory Visit: Admitting: Neurology

## 2024-08-16 ENCOUNTER — Ambulatory Visit (HOSPITAL_COMMUNITY): Payer: PRIVATE HEALTH INSURANCE

## 2024-08-16 ENCOUNTER — Ambulatory Visit: Payer: PRIVATE HEALTH INSURANCE | Admitting: Vascular Surgery
